# Patient Record
Sex: Female | Born: 1945 | Race: White | Hispanic: No | Smoking: Never smoker
Health system: Southern US, Community
[De-identification: ages and names within clinical notes are randomized; demographics above are authoritative.]

## PROBLEM LIST (undated history)

## (undated) DIAGNOSIS — I639 Cerebral infarction, unspecified: Secondary | ICD-10-CM

## (undated) DIAGNOSIS — G459 Transient cerebral ischemic attack, unspecified: Secondary | ICD-10-CM

## (undated) DIAGNOSIS — D649 Anemia, unspecified: Secondary | ICD-10-CM

## (undated) DIAGNOSIS — R42 Dizziness and giddiness: Secondary | ICD-10-CM

## (undated) DIAGNOSIS — E119 Type 2 diabetes mellitus without complications: Secondary | ICD-10-CM

## (undated) DIAGNOSIS — H269 Unspecified cataract: Secondary | ICD-10-CM

## (undated) DIAGNOSIS — J189 Pneumonia, unspecified organism: Secondary | ICD-10-CM

## (undated) DIAGNOSIS — I10 Essential (primary) hypertension: Secondary | ICD-10-CM

## (undated) HISTORY — PX: LITHOTRIPSY: SUR834

## (undated) HISTORY — PX: CHOLECYSTECTOMY: SHX55

## (undated) HISTORY — PX: BREAST SURGERY: SHX581

## (undated) HISTORY — PX: ABDOMINAL HYSTERECTOMY: SHX81

## (undated) HISTORY — DX: Unspecified cataract: H26.9

---

## 2004-03-21 ENCOUNTER — Inpatient Hospital Stay: Payer: Self-pay | Admitting: Internal Medicine

## 2004-04-16 ENCOUNTER — Ambulatory Visit: Payer: Self-pay | Admitting: Internal Medicine

## 2004-05-16 ENCOUNTER — Ambulatory Visit: Payer: Self-pay | Admitting: Cardiovascular Disease

## 2004-07-09 ENCOUNTER — Emergency Department: Payer: Self-pay | Admitting: Emergency Medicine

## 2004-07-09 ENCOUNTER — Other Ambulatory Visit: Payer: Self-pay

## 2004-07-10 ENCOUNTER — Inpatient Hospital Stay: Payer: Self-pay | Admitting: Cardiovascular Disease

## 2004-07-10 ENCOUNTER — Other Ambulatory Visit: Payer: Self-pay

## 2004-09-07 ENCOUNTER — Inpatient Hospital Stay: Payer: Self-pay | Admitting: Internal Medicine

## 2008-06-30 ENCOUNTER — Emergency Department: Payer: Self-pay | Admitting: Emergency Medicine

## 2010-09-29 ENCOUNTER — Emergency Department: Payer: Self-pay | Admitting: Emergency Medicine

## 2010-10-01 ENCOUNTER — Inpatient Hospital Stay: Payer: Self-pay | Admitting: Surgery

## 2012-07-20 ENCOUNTER — Ambulatory Visit: Payer: Self-pay | Admitting: Internal Medicine

## 2013-05-23 ENCOUNTER — Inpatient Hospital Stay: Payer: Self-pay | Admitting: Specialist

## 2013-05-23 LAB — CBC
HCT: 33.9 % — AB (ref 35.0–47.0)
HGB: 11.3 g/dL — AB (ref 12.0–16.0)
MCH: 30.4 pg (ref 26.0–34.0)
MCHC: 33.3 g/dL (ref 32.0–36.0)
MCV: 91 fL (ref 80–100)
Platelet: 159 10*3/uL (ref 150–440)
RBC: 3.71 10*6/uL — ABNORMAL LOW (ref 3.80–5.20)
RDW: 12.7 % (ref 11.5–14.5)
WBC: 7.8 10*3/uL (ref 3.6–11.0)

## 2013-05-23 LAB — URINALYSIS, COMPLETE
BILIRUBIN, UR: NEGATIVE
Bacteria: NONE SEEN
Blood: NEGATIVE
Glucose,UR: >=500 mg/dL (ref 0–75)
Ketone: NEGATIVE
Leukocyte Esterase: NEGATIVE
NITRITE: NEGATIVE
PH: 7 (ref 4.5–8.0)
Protein: 100
RBC,UR: 1 /HPF (ref 0–5)
SPECIFIC GRAVITY: 1.018 (ref 1.003–1.030)
Squamous Epithelial: NONE SEEN

## 2013-05-23 LAB — COMPREHENSIVE METABOLIC PANEL
ALT: 17 U/L (ref 12–78)
Albumin: 2.6 g/dL — ABNORMAL LOW (ref 3.4–5.0)
Alkaline Phosphatase: 120 U/L — ABNORMAL HIGH
Anion Gap: 8 (ref 7–16)
BUN: 40 mg/dL — AB (ref 7–18)
Bilirubin,Total: 0.3 mg/dL (ref 0.2–1.0)
CALCIUM: 9.1 mg/dL (ref 8.5–10.1)
CREATININE: 2.26 mg/dL — AB (ref 0.60–1.30)
Chloride: 89 mmol/L — ABNORMAL LOW (ref 98–107)
Co2: 25 mmol/L (ref 21–32)
EGFR (Non-African Amer.): 21 — ABNORMAL LOW
GFR CALC AF AMER: 25 — AB
Glucose: 897 mg/dL (ref 65–99)
OSMOLALITY: 300 (ref 275–301)
POTASSIUM: 5 mmol/L (ref 3.5–5.1)
SGOT(AST): 12 U/L — ABNORMAL LOW (ref 15–37)
Sodium: 122 mmol/L — ABNORMAL LOW (ref 136–145)
TOTAL PROTEIN: 7.6 g/dL (ref 6.4–8.2)

## 2013-05-23 LAB — TROPONIN I
Troponin-I: <0.02 ng/mL
Troponin-I: <0.02 ng/mL
Troponin-I: <0.02 ng/mL

## 2013-05-23 LAB — CK TOTAL AND CKMB (NOT AT ARMC)
CK, TOTAL: 72 U/L
CK, Total: 51 U/L
CK, Total: 51 U/L
CK-MB: 1 ng/mL (ref 0.5–3.6)
CK-MB: 1 ng/mL (ref 0.5–3.6)
CK-MB: 1 ng/mL (ref 0.5–3.6)

## 2013-05-23 LAB — HEMOGLOBIN A1C: HEMOGLOBIN A1C: 13.3 % — AB (ref 4.2–6.3)

## 2013-05-24 LAB — LIPID PANEL
Cholesterol: 159 mg/dL (ref 0–200)
HDL Cholesterol: 26 mg/dL — ABNORMAL LOW (ref 40–60)
TRIGLYCERIDES: 501 mg/dL — AB (ref 0–200)

## 2013-05-24 LAB — CBC WITH DIFFERENTIAL/PLATELET
BASOS PCT: 0.8 %
Basophil #: 0.1 10*3/uL (ref 0.0–0.1)
EOS PCT: 4.6 %
Eosinophil #: 0.3 10*3/uL (ref 0.0–0.7)
HCT: 29.8 % — ABNORMAL LOW (ref 35.0–47.0)
HGB: 10.4 g/dL — ABNORMAL LOW (ref 12.0–16.0)
LYMPHS ABS: 2.9 10*3/uL (ref 1.0–3.6)
Lymphocyte %: 38.9 %
MCH: 30.7 pg (ref 26.0–34.0)
MCHC: 35 g/dL (ref 32.0–36.0)
MCV: 88 fL (ref 80–100)
Monocyte #: 0.5 x10 3/mm (ref 0.2–0.9)
Monocyte %: 7.2 %
NEUTROS ABS: 3.6 10*3/uL (ref 1.4–6.5)
NEUTROS PCT: 48.5 %
Platelet: 184 10*3/uL (ref 150–440)
RBC: 3.4 10*6/uL — ABNORMAL LOW (ref 3.80–5.20)
RDW: 13.3 % (ref 11.5–14.5)
WBC: 7.5 10*3/uL (ref 3.6–11.0)

## 2013-05-24 LAB — BASIC METABOLIC PANEL
ANION GAP: 5 — AB (ref 7–16)
BUN: 26 mg/dL — AB (ref 7–18)
CALCIUM: 8.4 mg/dL — AB (ref 8.5–10.1)
Chloride: 106 mmol/L (ref 98–107)
Co2: 26 mmol/L (ref 21–32)
Creatinine: 1.91 mg/dL — ABNORMAL HIGH (ref 0.60–1.30)
EGFR (African American): 30 — ABNORMAL LOW
EGFR (Non-African Amer.): 26 — ABNORMAL LOW
Glucose: 136 mg/dL — ABNORMAL HIGH (ref 65–99)
OSMOLALITY: 281 (ref 275–301)
Potassium: 3.6 mmol/L (ref 3.5–5.1)
Sodium: 137 mmol/L (ref 136–145)

## 2014-02-23 ENCOUNTER — Ambulatory Visit: Payer: Self-pay | Admitting: Internal Medicine

## 2014-02-27 DIAGNOSIS — M7062 Trochanteric bursitis, left hip: Secondary | ICD-10-CM | POA: Diagnosis not present

## 2014-02-27 DIAGNOSIS — M79605 Pain in left leg: Secondary | ICD-10-CM | POA: Diagnosis not present

## 2014-04-06 DIAGNOSIS — M79605 Pain in left leg: Secondary | ICD-10-CM | POA: Diagnosis not present

## 2014-04-06 DIAGNOSIS — E1122 Type 2 diabetes mellitus with diabetic chronic kidney disease: Secondary | ICD-10-CM | POA: Diagnosis not present

## 2014-04-06 DIAGNOSIS — E039 Hypothyroidism, unspecified: Secondary | ICD-10-CM | POA: Diagnosis not present

## 2014-04-06 DIAGNOSIS — E782 Mixed hyperlipidemia: Secondary | ICD-10-CM | POA: Diagnosis not present

## 2014-04-06 DIAGNOSIS — I739 Peripheral vascular disease, unspecified: Secondary | ICD-10-CM | POA: Diagnosis not present

## 2014-04-20 DIAGNOSIS — F331 Major depressive disorder, recurrent, moderate: Secondary | ICD-10-CM | POA: Diagnosis not present

## 2014-04-20 DIAGNOSIS — E782 Mixed hyperlipidemia: Secondary | ICD-10-CM | POA: Diagnosis not present

## 2014-04-20 DIAGNOSIS — M7062 Trochanteric bursitis, left hip: Secondary | ICD-10-CM | POA: Diagnosis not present

## 2014-04-20 DIAGNOSIS — I10 Essential (primary) hypertension: Secondary | ICD-10-CM | POA: Diagnosis not present

## 2014-04-20 DIAGNOSIS — E1122 Type 2 diabetes mellitus with diabetic chronic kidney disease: Secondary | ICD-10-CM | POA: Diagnosis not present

## 2014-06-01 DIAGNOSIS — F331 Major depressive disorder, recurrent, moderate: Secondary | ICD-10-CM | POA: Diagnosis not present

## 2014-06-01 DIAGNOSIS — R609 Edema, unspecified: Secondary | ICD-10-CM | POA: Diagnosis not present

## 2014-06-01 DIAGNOSIS — D631 Anemia in chronic kidney disease: Secondary | ICD-10-CM | POA: Diagnosis not present

## 2014-06-01 DIAGNOSIS — N184 Chronic kidney disease, stage 4 (severe): Secondary | ICD-10-CM | POA: Diagnosis not present

## 2014-06-01 DIAGNOSIS — E1129 Type 2 diabetes mellitus with other diabetic kidney complication: Secondary | ICD-10-CM | POA: Diagnosis not present

## 2014-06-01 DIAGNOSIS — E1122 Type 2 diabetes mellitus with diabetic chronic kidney disease: Secondary | ICD-10-CM | POA: Diagnosis not present

## 2014-06-01 DIAGNOSIS — I739 Peripheral vascular disease, unspecified: Secondary | ICD-10-CM | POA: Diagnosis not present

## 2014-06-01 DIAGNOSIS — R809 Proteinuria, unspecified: Secondary | ICD-10-CM | POA: Diagnosis not present

## 2014-06-01 DIAGNOSIS — I10 Essential (primary) hypertension: Secondary | ICD-10-CM | POA: Diagnosis not present

## 2014-06-17 NOTE — Discharge Summary (Signed)
PATIENT NAME:  Felicia Vincent, Felicia Vincent MR#:  [id number] DATE OF BIRTH:  January 17, 1946  DATE OF ADMISSION:  05/23/2013 DATE OF DISCHARGE:  05/24/2013  For a detailed note, please see the history and physical done on admission by Dr. Waldron Labs.   DIAGNOSES AT DISCHARGE:  As follows: 1.  Altered mental status, likely secondary to metabolic encephalopathy. 2.  Severe hyperglycemia due to uncontrolled diabetes. 3.  Chronic kidney disease stage III. 4.  Hypertension. 5.  Hyperlipidemia. 6.  Depression.  The patient is being discharged on a low-sodium, low-fat, American Diabetic Association Diet.   ACTIVITY: As tolerated.  FOLLOWUP:  Dr. Clayborn Bigness in the next 1 to 2 weeks.  DISCHARGE MEDICATIONS: Victoza 18 mg subcutaneous daily, sotalol 120 mg b.i.d., simvastatin 20 mg at bedtime, Imdur 60 mg b.i.d., Celexa 40 mg daily, Plavix 75 mg daily, amlodipine 10 mg daily and Levemir pen 15 units b.i.d. with meals.   PERTINENT STUDIES DONE DURING THE HOSPITAL COURSE:  Are as follows: CT scan of the head done without contrast showing no evidence of any acute intracranial process, chronic small vessel ischemia. A chest x-ray done on admission showing no acute cardiopulmonary disease. An MRI of the brain done without contrast showing no acute intracranial abnormality with no acute stroke, mass or hemorrhage.   HOSPITAL COURSE: This is a 69 year old female who presented to the hospital with altered mental status, confusion.   PROBLEM: 1.  Altered mental status/confusion. The most likely cause of this was metabolic encephalopathy related to underlying severe hyperglycemia. There was some concern for possible underlying stroke, but it has been ruled out. The patient had a CT head which was negative and also an MRI of the brain which was also essentially negative. The patient was aggressively hydrated with IV fluids, placed on an insulin drip and her sugars were corrected and since her sugars have normalized, her mental  status is now back down to baseline.  2.  Hyponatremia. This is most likely pseudohyponatremia from her severe hyperglycemia. After her sugars are corrected, her sodium has now normalized.  3.  History of previous transient ischemic attacks. The patient was maintained on her Plavix. She will continue that. She did have a workup for a possible stroke, as mentioned, which was essentially negative.  4.  Acute on chronic renal failure. The patient's creatinine remained at baseline. She has a history of underlying diabetes. She would probably benefit from being followed by a nephrologist. I did refer her to [facility] as an outpatient.  5.  Hypertension. The patient remained hemodynamically stable. She will continue Imdur and Norvasc.  6.  Hyperlipidemia. The patient was maintained on her simvastatin. She will resume that. 7.  Depression. The patient was maintained on Celexa. She will also resume that. 8.  Uncontrolled diabetes. This was secondary to noncompliance, as the patient was taking her scheduled medications but was not being compliant with her diet. She was strongly advised to do as this will lead to further complications and she understands that. At this point, she will continue her maintenance medications including Levemir and Victoza and have each hemoglobin A1c checked through the next 3 months. Her A1c at discharge was 13.  The patient is a full code.   DISPOSITION: She is being discharged home.   TIME SPENT: 40 minutes.   ____________________________ Belia Heman. Verdell Carmine, MD vjs:ce D: 05/24/2013 15:18:42 ET T: 05/24/2013 19:51:15 ET JOB#: [id number]  cc: Belia Heman. Verdell Carmine, MD, <Dictator> Lavera Guise, MD Henreitta Leber MD  ELECTRONICALLY SIGNED 05/25/2013 14:52

## 2014-06-17 NOTE — H&P (Signed)
PATIENT NAME:  Felicia Vincent, Felicia Vincent MR#:  [id number] DATE OF BIRTH:  [birth year]  DATE OF ADMISSION:  05/23/2013  PRIMARY CARE PHYSICIAN: Dr. Clayborn Bigness.   CHIEF COMPLAINT: Altered mental status. Hyperglycemia.   HISTORY OF PRESENT ILLNESS: This is a 69 year old female with known history of diabetes, hypertension, history of TIA in the past. The patient presents with altered mental status. The patient lives with her son, the patient had fell on the floor . There was no loss of consciousness. Son helped her to come from the floor,  he did notice some mild right side weakness. When the patient presented to the ED she was found to have hyperglycemia with her blood glucose 897. The patient does not notice to have any focal deficits. The patient was given 2 liters of IV normal saline where her mentation much improved. She remained slightly confused, The patient's CT head did not show any acute findings. The patient's blood work did not show any anion gap. The patient was found to have creatinine of 2.26 which is around her baseline. Family reports they are unclear if the patient has been taking her insulin or not, but reports she has been taking it till yesterday morning dose. Reports usually the patient, she is in charge of her medication as she is usually sharp and alert and she is following up on her on own fingersticks and insulin. The patient was started on insulin drip in the ED, and hospitalist requested to admit the patient for further evaluation.   PAST MEDICAL HISTORY: 1. Hypertension.  2. Diabetes.  3. History of transient ischemic attack in the past.  4. Hyperlipidemia.   PAST SURGICAL HISTORY:  1. Lithotripsy.  3. Hysterectomy. 4. Cholecystectomy.   ALLERGIES: No known drug allergies.   SOCIAL HISTORY: No tobacco. No alcohol. No illicit drug use. The patient lives with the son.  FAMILY HISTORY:  Significant for diabetes and liver cancer.   HOME MEDICATIONS:  1. Imdur 60 mg oral daily.   2. Sotalol 120 mg oral 2 times a day.  3. Citalopram 40 mg oral daily.  4. Levemir twice a day, unclear what the dose is. The patient thinks it is 15 units, but this can not be confirmed for sure as she is confused.  5. Victoza 18 mg subcutaneous daily.  6. Simvastatin 20 mg at bedtime.  7. Plavix 75 mg daily.  8. Norvasc 10 mg daily.   REVIEW OF SYSTEMS:  CONSTITUTIONAL:  The patient denies any fever, any chills, any fatigue . weight gain, weight loss. EYES:  Denies blurry vision, double vision, inflammation, glaucoma.  ENT: Denies tinnitus, ear chest pain, hearing loss or discharge.  RESPIRATORY: Denies cough, wheezing, hemoptysis, dyspnea.  CARDIOVASCULAR: Denies chest pain, edema, palpitation. Reports fall,  GASTROINTESTINAL: Denies nausea, vomiting, diarrhea, abdominal pain, hematemesis, or melena.  GENITOURINARY: Denies dysuria, hematuria, or renal colic.  ENDOCRINE: Denies polyuria, polydipsia, heat or cold intolerance.  HEMATOLOGY: Denies anemia, easy bruising, bleeding diathesis.  INTEGUMENT: Denies acne, rash or skin lesion.  MUSCULOSKELETAL: Denies any swelling, gout, cramps.  NEUROLOGIC: Denies any headache, any vertigo, any tremors, any migraine. The family reports the patient having history of TIA in the past. The patient reports currently, no focal deficits.  PSYCHIATRIC: No history of alcohol abuse, substance abuse, no anxiety. No schizophrenia.   PHYSICAL EXAMINATION: VITAL SIGNS: Temperature 97.5, pulse 75, respiratory rate 22, blood pressure 150/90, saturating 95% on room air. Upon presentation, blood pressure was 216/94.  GENERAL: Obese female who looks  comfortable, in no apparent distress.   HEENT: , normocephalic. Pupils equal, reactive to light. Pink conjunctivae. Anicteric sclerae. Dry oral mucosa.  NECK: Supple. No thyromegaly. No JVD.  CHEST: Good air entry bilaterally. No wheezing, rales, rhonchi.  CARDIOVASCULAR: S1, S2 heard. No rubs, murmurs, gallops,  regular rate and rhythm.  ABDOMEN: Obese, soft, nontender, nondistended. Bowel sounds present.  EXTREMITIES: No edema. No clubbing. No cyanosis. Tibial pulses felt bilaterally.  PSYCHIATRIC: The patient is confused, but awake, communicative, alert to place, name and year.  MUSCULOSKELETAL: No joint effusion or erythema. Has mild bruise on the right lower extremity and left knee most likely from fall, and as well, has bruise on the right cheek area from the fall.  SKIN: Decreased skin turgor, dry.   PERTINENT LABORATORY DATA: Glucose 897. BUN 40, creatinine 2.26, sodium 122, potassium 5, chloride 89, CO2 25, ALT 17, AST 12, alkaline phosphatase 120. White blood cells 7.8, hemoglobin 11.3, hematocrit 33.9, platelets 159, urinalysis negative leukocyte esterase and nitrite, pH 7.27, pCO2 51.   CT head without contrast showing no acute intracranial findings and chronic small vessel ischemia. Chest x-ray showing cardiomegaly with no edema or pneumonia.   ASSESSMENT AND PLAN: 1. Encephalopathy, the patient presents with altered mental status, status post fall, unclear if this is related to syncope or presyncope, it is  most likely related to metabolic encephalopathy from her hyperglycemia. Had much improvement after she received IV fluids, as well, we will check MRI of the brain as the family reports questionable right side weakness when that episode happened. Currently, as the patient has normal physical exam, we will give her one dose of 324 mg of aspirin. As well as the patient's blood pressure was found to be significantly elevated as well upon presentation, so this might be as well related to hypertensive encephalopathy as well.  2. Uncontrolled diabetes mellitus with hyperglycemia. The patient does not appear to be in diabetic ketoacidosis as has normal anion gap. We will start her on insulin drip as sugar is significantly elevated,   . We will start her back on her Levemir and insulin sliding scale, but  we will need to verify dose of Levemir by her PCP first.  3. Hypertension. Continue the patient back on sotalol and Norvasc.  4. History of transient ischemic attack in the past. Continue with Plavix.  5. Chronic kidney disease appears to be at baseline.  6. Deep vein thrombosis prophylaxis. Subcutaneous heparin.  7. CODE STATUS: Family reports the patient is FULL CODE.   TOTAL TIME SPENT ON ADMISSION AND PATIENT CARE: 55 minutes.     ____________________________ Albertine Patricia, MD dse:sg D: 05/23/2013 06:59:59 ET T: 05/23/2013 08:32:35 ET JOB#: [id number]  cc: Albertine Patricia, MD, <Dictator> Quinnlan Abruzzo Graciela Husbands MD ELECTRONICALLY SIGNED 05/28/2013 14:34

## 2014-06-22 ENCOUNTER — Ambulatory Visit
Admit: 2014-06-22 | Disposition: A | Discharge status: Home / Self Care | Payer: Self-pay | Attending: Nephrology | Admitting: Nephrology

## 2014-06-22 DIAGNOSIS — I10 Essential (primary) hypertension: Secondary | ICD-10-CM | POA: Diagnosis not present

## 2014-06-22 DIAGNOSIS — N184 Chronic kidney disease, stage 4 (severe): Secondary | ICD-10-CM | POA: Diagnosis not present

## 2014-06-22 DIAGNOSIS — N189 Chronic kidney disease, unspecified: Secondary | ICD-10-CM | POA: Diagnosis not present

## 2014-06-26 ENCOUNTER — Ambulatory Visit: Payer: Self-pay | Admitting: Hematology and Oncology

## 2014-07-08 ENCOUNTER — Emergency Department
Admission: EM | Admit: 2014-07-08 | Discharge: 2014-07-08 | Disposition: A | Discharge status: Home / Self Care | Payer: Medicare Other | Attending: Emergency Medicine | Admitting: Emergency Medicine

## 2014-07-08 ENCOUNTER — Other Ambulatory Visit: Payer: Self-pay

## 2014-07-08 ENCOUNTER — Encounter: Payer: Self-pay | Admitting: Emergency Medicine

## 2014-07-08 DIAGNOSIS — N179 Acute kidney failure, unspecified: Secondary | ICD-10-CM | POA: Diagnosis not present

## 2014-07-08 DIAGNOSIS — N19 Unspecified kidney failure: Secondary | ICD-10-CM

## 2014-07-08 DIAGNOSIS — I10 Essential (primary) hypertension: Secondary | ICD-10-CM | POA: Diagnosis not present

## 2014-07-08 DIAGNOSIS — E119 Type 2 diabetes mellitus without complications: Secondary | ICD-10-CM | POA: Diagnosis not present

## 2014-07-08 DIAGNOSIS — R51 Headache: Secondary | ICD-10-CM | POA: Diagnosis present

## 2014-07-08 HISTORY — DX: Transient cerebral ischemic attack, unspecified: G45.9

## 2014-07-08 HISTORY — DX: Type 2 diabetes mellitus without complications: E11.9

## 2014-07-08 HISTORY — DX: Essential (primary) hypertension: I10

## 2014-07-08 LAB — CBC
HCT: 30.5 % — ABNORMAL LOW (ref 35.0–47.0)
HEMOGLOBIN: 10 g/dL — AB (ref 12.0–16.0)
MCH: 28.4 pg (ref 26.0–34.0)
MCHC: 32.6 g/dL (ref 32.0–36.0)
MCV: 87 fL (ref 80.0–100.0)
Platelets: 199 10*3/uL (ref 150–440)
RBC: 3.51 MIL/uL — AB (ref 3.80–5.20)
RDW: 13.7 % (ref 11.5–14.5)
WBC: 8.7 10*3/uL (ref 3.6–11.0)

## 2014-07-08 LAB — URINALYSIS COMPLETE WITH MICROSCOPIC (ARMC ONLY)
BACTERIA UA: NONE SEEN
BILIRUBIN URINE: NEGATIVE
Glucose, UA: 150 mg/dL — AB
HGB URINE DIPSTICK: NEGATIVE
Ketones, ur: NEGATIVE mg/dL
Leukocytes, UA: NEGATIVE
Nitrite: NEGATIVE
PH: 5 (ref 5.0–8.0)
SPECIFIC GRAVITY, URINE: 1.011 (ref 1.005–1.030)

## 2014-07-08 LAB — BASIC METABOLIC PANEL
ANION GAP: 10 (ref 5–15)
BUN: 41 mg/dL — ABNORMAL HIGH (ref 6–20)
CHLORIDE: 103 mmol/L (ref 101–111)
CO2: 23 mmol/L (ref 22–32)
CREATININE: 3 mg/dL — AB (ref 0.44–1.00)
Calcium: 9 mg/dL (ref 8.9–10.3)
GFR calc Af Amer: 17 mL/min — ABNORMAL LOW (ref >60)
GFR, EST NON AFRICAN AMERICAN: 15 mL/min — AB (ref >60)
GLUCOSE: 245 mg/dL — AB (ref 65–99)
Potassium: 4.1 mmol/L (ref 3.5–5.1)
SODIUM: 136 mmol/L (ref 135–145)

## 2014-07-08 LAB — TROPONIN I: Troponin I: 0.03 ng/mL (ref <0.031)

## 2014-07-08 MED ORDER — BUTALBITAL-APAP-CAFFEINE 50-325-40 MG PO TABS
ORAL_TABLET | ORAL | Status: AC
  Administered 2014-07-08: 2 via ORAL
  Filled 2014-07-08: qty 2

## 2014-07-08 MED ORDER — BUTALBITAL-APAP-CAFFEINE 50-325-40 MG PO TABS
2.0000 | ORAL_TABLET | Freq: Once | ORAL | Status: AC
  Administered 2014-07-08: 2 via ORAL

## 2014-07-08 NOTE — ED Provider Notes (Signed)
[facility] Emergency Department Provider Note    Time seen: 78  I have reviewed the triage vital signs and the nursing notes.   HISTORY  Chief Complaint Headache and Hypertension    HPI Felicia Vincent is a 69 y.o. female presents ER for headache on and off last week. Patient states also her blood pressures been elevated, today her blood pressure was elevated over 160 systolic. Easily she's not had adequate control blood pressure, is taking blood pressure medication. Pain is dull in her head currently bitemporal as well as frontal. Light makes it worse. Pain is mild to moderate distention.    Past Medical History  Diagnosis Date  . Hypertension   . Diabetes mellitus without complication   . TIA (transient ischemic attack)     There are no active problems to display for this patient.   History reviewed. No pertinent past surgical history.  No current outpatient prescriptions on file.  Allergies Review of patient's allergies indicates no known allergies.  History reviewed. No pertinent family history.  Social History History  Substance Use Topics  . Smoking status: Never Smoker   . Smokeless tobacco: Never Used  . Alcohol Use: No    Review of Systems Constitutional: Negative for fever. Eyes: Negative for visual changes. ENT: Negative for sore throat. Cardiovascular: Negative for chest pain. Respiratory: Negative for shortness of breath. Gastrointestinal: Negative for abdominal pain, vomiting and diarrhea. Genitourinary: Negative for dysuria. Musculoskeletal: Negative for back pain. Skin: Negative for rash. Neurological: Positive for headaches negative for focal weakness or numbness.  10-point ROS otherwise negative.  ____________________________________________   PHYSICAL EXAM:  VITAL SIGNS: ED Triage Vitals  Enc Vitals Group     BP 07/08/14 1337 193/71 mmHg     Pulse Rate 07/08/14 1336 86     Resp --      Temp 07/08/14  1336 98.4 F (36.9 C)     Temp Source 07/08/14 1336 Oral     SpO2 07/08/14 1337 99 %     Weight 07/08/14 1337 189 lb (85.73 kg)     Height 07/08/14 1337 5\' 3"  (1.6 m)     Head Cir --      Peak Flow --      Pain Score 07/08/14 1337 5     Pain Loc --      Pain Edu? --      Excl. in [place]? --     Constitutional: Alert and oriented. Well appearing and in no distress. Eyes: Conjunctivae are normal. PERRL. Normal extraocular movements. ENT   Head: Normocephalic and atraumatic.   Nose: No congestion/rhinnorhea.   Mouth/Throat: Mucous membranes are moist.   Neck: No stridor. Hematological/Lymphatic/Immunilogical: No cervical lymphadenopathy. Cardiovascular: Normal rate, regular rhythm. Normal and symmetric distal pulses are present in all extremities. No murmurs, rubs, or gallops. Respiratory: Normal respiratory effort without tachypnea nor retractions. Breath sounds are clear and equal bilaterally. No wheezes/rales/rhonchi. Gastrointestinal: Soft and nontender. No distention. No abdominal bruits. There is no CVA tenderness. Musculoskeletal: Nontender with normal range of motion in all extremities. No joint effusions.  No lower extremity tenderness nor edema. Neurologic:  Normal speech and language. No gross focal neurologic deficits are appreciated. Speech is normal. No gait instability. Skin:  Skin is warm, dry and intact. No rash noted. Psychiatric: Mood and affect are normal. Speech and behavior are normal. Patient exhibits appropriate insight and judgment.  ____________________________________________    LABS (pertinent positives/negatives)  Labs Reviewed  CBC - Abnormal; Notable  for the following:    RBC 3.51 (*)    Hemoglobin 10.0 (*)    HCT 30.5 (*)    All other components within normal limits  URINALYSIS COMPLETEWITH MICROSCOPIC ([facility])  - Abnormal; Notable for the following:    Color, Urine STRAW (*)    APPearance CLEAR (*)    Glucose, UA 150 (*)    Protein,  ur >500 (*)    Squamous Epithelial / LPF 0-5 (*)    All other components within normal limits  BASIC METABOLIC PANEL  TROPONIN I   Creatinine is over 3.0  ____________________________________________    RADIOLOGY  None  ____________________________________________ EKG: EKG was normal sinus rhythm, rate 84, normal axis normal intervals, nonspecific ST-T wave changes.   ED COURSE  Pertinent labs & imaging results that were available during my care of the patient were reviewed by me and considered in my medical decision making (see chart for details).  Had an extensive discussion with the patient about needing to stay in the hospital for her worsening renal function. Patient does not want to stay states she has appointment on Monday with hematology, nephrology, and her primary care doctor. I advised I could not ensure her safety if she went home, she is adamant that she goes home and just follows up on Monday.  FINAL ASSESSMENT AND PLAN  Renal failure, hypertension, headache  Plan: patient is leaving against my advice, is encouraged to be admitted for further workup concerning her renal failure.    Earleen Newport, MD   Earleen Newport, MD 07/08/14 [phone]

## 2014-07-08 NOTE — Discharge Instructions (Signed)
End-Stage Kidney Disease °The kidneys are two organs that lie on either side of the spine between the middle of the back and the front of the abdomen. The kidneys:  °· Remove wastes and extra water from the blood.   °· Produce important hormones. These help keep bones strong, regulate blood pressure, and help create red blood cells.   °· Balance the fluids and chemicals in the blood and tissues. °End-stage kidney disease occurs when the kidneys are so damaged that they cannot do their job. When the kidneys cannot do their job, life-threatening problems occur. The body cannot stay clean and strong without the help of the kidneys. In end-stage kidney disease, the kidneys cannot get better. You need a new kidney or treatments to do some of the work healthy kidneys do in order to stay alive. °CAUSES  °End-stage kidney disease usually occurs when a long-lasting (chronic) kidney disease gets worse. It may also occur after the kidneys are suddenly damaged (acute kidney injury).  °SYMPTOMS  °· Swelling (edema) of the legs, ankles, or feet.   °· Tiredness (lethargy).   °· Nausea or vomiting.   °· Confusion.   °· Problems with urination, such as:   °¨ Decreased urine production.   °¨ Frequent urination, especially at night.   °¨ Frequent accidents in children who are potty trained.   °· Muscle twitches and cramps.   °· Persistent itchiness.   °· Loss of appetite.   °· Headaches.   °· Abnormally dark or light skin.   °· Numbness in the hands or feet.   °· Easy bruising.   °· Frequent hiccups.   °· Menstruation stops. °DIAGNOSIS  °Your health care provider will measure your blood pressure and take some tests. These may include:  °· Urine tests.   °· Blood tests.   °· Imaging tests, such as:   °¨ An ultrasound exam.   °¨ Computed tomography (CT). °· A kidney biopsy. °TREATMENT  °There are two treatments for end-stage kidney disease:  °· A procedure that removes toxic wastes from the body (dialysis).   °· Receiving a new kidney  (kidney transplant). °Both of these treatments have serious risks and consequences. Your health care provider will help you determine which treatment is best for you based on your health, age, and other factors. In addition to having dialysis or a kidney transplant, you may need to take medicines to control high blood pressure (hypertension) and cholesterol and to decrease phosphorus levels in your blood.  °HOME CARE INSTRUCTIONS °· Follow your prescribed diet.   °· Take medicines only as directed by your health care provider.   °· Do not take any new medicines (prescription, over-the-counter, or nutritional supplements) unless approved by your health care provider. Many medicines can worsen your kidney damage or need to have the dose adjusted.   °· Keep all follow-up visits as directed by your health care provider. °MAKE SURE YOU: °· Understand these instructions. °· Will watch your condition. °· Will get help right away if you are not doing well or get worse. °Document Released: 05/03/2003 Document Revised: 06/27/2013 Document Reviewed: 10/10/2011 °[employer]® Patient Information ©2015 [employer]. This information is not intended to replace advice given to you by your health care provider. Make sure you discuss any questions you have with your health care provider. ° °

## 2014-07-08 NOTE — ED Notes (Signed)
Patient to ED with c/o headache off and on for about a week, most recently has been having higher blood pressures and elevated heart rate off and on at times when she checks it.

## 2014-07-08 NOTE — ED Notes (Signed)
Patient recently treated with Z pak for sore throat and head stopping up, but stopped taking medication because headache got worse and blood pressure started going up.

## 2014-07-10 ENCOUNTER — Inpatient Hospital Stay: Payer: Medicare Other | Attending: Hematology and Oncology | Admitting: Hematology and Oncology

## 2014-07-10 ENCOUNTER — Encounter: Payer: Self-pay | Admitting: Hematology and Oncology

## 2014-07-10 ENCOUNTER — Inpatient Hospital Stay: Payer: Medicare Other

## 2014-07-10 DIAGNOSIS — Z79899 Other long term (current) drug therapy: Secondary | ICD-10-CM | POA: Insufficient documentation

## 2014-07-10 DIAGNOSIS — N189 Chronic kidney disease, unspecified: Secondary | ICD-10-CM | POA: Diagnosis not present

## 2014-07-10 DIAGNOSIS — F331 Major depressive disorder, recurrent, moderate: Secondary | ICD-10-CM | POA: Diagnosis not present

## 2014-07-10 DIAGNOSIS — N184 Chronic kidney disease, stage 4 (severe): Secondary | ICD-10-CM | POA: Diagnosis not present

## 2014-07-10 DIAGNOSIS — D649 Anemia, unspecified: Secondary | ICD-10-CM

## 2014-07-10 DIAGNOSIS — I10 Essential (primary) hypertension: Secondary | ICD-10-CM | POA: Insufficient documentation

## 2014-07-10 DIAGNOSIS — Z9049 Acquired absence of other specified parts of digestive tract: Secondary | ICD-10-CM | POA: Insufficient documentation

## 2014-07-10 DIAGNOSIS — E119 Type 2 diabetes mellitus without complications: Secondary | ICD-10-CM | POA: Diagnosis not present

## 2014-07-10 DIAGNOSIS — R809 Proteinuria, unspecified: Secondary | ICD-10-CM | POA: Diagnosis not present

## 2014-07-10 DIAGNOSIS — Z7982 Long term (current) use of aspirin: Secondary | ICD-10-CM | POA: Insufficient documentation

## 2014-07-10 DIAGNOSIS — R609 Edema, unspecified: Secondary | ICD-10-CM | POA: Diagnosis not present

## 2014-07-10 DIAGNOSIS — Z8673 Personal history of transient ischemic attack (TIA), and cerebral infarction without residual deficits: Secondary | ICD-10-CM

## 2014-07-10 DIAGNOSIS — E1122 Type 2 diabetes mellitus with diabetic chronic kidney disease: Secondary | ICD-10-CM | POA: Diagnosis not present

## 2014-07-10 DIAGNOSIS — Z794 Long term (current) use of insulin: Secondary | ICD-10-CM | POA: Insufficient documentation

## 2014-07-10 DIAGNOSIS — E1129 Type 2 diabetes mellitus with other diabetic kidney complication: Secondary | ICD-10-CM | POA: Diagnosis not present

## 2014-07-10 DIAGNOSIS — N183 Chronic kidney disease, stage 3 (moderate): Secondary | ICD-10-CM | POA: Diagnosis not present

## 2014-07-10 LAB — RETICULOCYTES
RBC.: 3.6 MIL/uL — ABNORMAL LOW (ref 3.80–5.20)
Retic Count, Absolute: 61.2 10*3/uL (ref 19.0–183.0)
Retic Ct Pct: 1.7 % (ref 0.4–3.1)

## 2014-07-10 LAB — VITAMIN B12: Vitamin B-12: 2096 pg/mL — ABNORMAL HIGH (ref 180–914)

## 2014-07-10 LAB — IRON AND TIBC
Iron: 74 ug/dL (ref 28–170)
Saturation Ratios: 18 % (ref 10.4–31.8)
TIBC: 419 ug/dL (ref 250–450)
UIBC: 345 ug/dL

## 2014-07-10 LAB — FERRITIN: Ferritin: 16 ng/mL (ref 11–307)

## 2014-07-10 LAB — FOLATE: Folate: 8.7 ng/mL (ref >5.9)

## 2014-07-11 LAB — KAPPA/LAMBDA LIGHT CHAINS
Kappa free light chain: 80.95 mg/L — ABNORMAL HIGH (ref 3.30–19.40)
Kappa, lambda light chain ratio: 1.25 (ref 0.26–1.65)
Lambda free light chains: 64.86 mg/L — ABNORMAL HIGH (ref 5.71–26.30)

## 2014-07-11 LAB — ANTINUCLEAR ANTIBODIES, IFA: ANA Ab, IFA: NEGATIVE

## 2014-07-11 LAB — PROTEIN ELECTROPHORESIS, SERUM
A/G Ratio: 0.7 (ref 0.7–2.0)
Albumin ELP: 2.7 g/dL — ABNORMAL LOW (ref 3.2–5.6)
Alpha-1-Globulin: 0.3 g/dL (ref 0.1–0.4)
Alpha-2-Globulin: 1.2 g/dL (ref 0.4–1.2)
Beta Globulin: 1.5 g/dL — ABNORMAL HIGH (ref 0.6–1.3)
Gamma Globulin: 0.9 g/dL (ref 0.5–1.6)
Globulin, Total: 3.8 g/dL (ref 2.0–4.5)
Total Protein ELP: 6.5 g/dL (ref 6.0–8.5)

## 2014-07-11 LAB — ERYTHROPOIETIN: Erythropoietin: 15.1 m[IU]/mL (ref 2.6–18.5)

## 2014-07-20 ENCOUNTER — Other Ambulatory Visit: Payer: Self-pay

## 2014-07-20 DIAGNOSIS — I10 Essential (primary) hypertension: Secondary | ICD-10-CM | POA: Diagnosis not present

## 2014-07-20 DIAGNOSIS — I739 Peripheral vascular disease, unspecified: Secondary | ICD-10-CM | POA: Diagnosis not present

## 2014-07-20 DIAGNOSIS — N184 Chronic kidney disease, stage 4 (severe): Secondary | ICD-10-CM | POA: Diagnosis not present

## 2014-07-20 DIAGNOSIS — D631 Anemia in chronic kidney disease: Secondary | ICD-10-CM | POA: Diagnosis not present

## 2014-07-20 DIAGNOSIS — D649 Anemia, unspecified: Secondary | ICD-10-CM

## 2014-07-20 DIAGNOSIS — E1122 Type 2 diabetes mellitus with diabetic chronic kidney disease: Secondary | ICD-10-CM | POA: Diagnosis not present

## 2014-07-21 LAB — UPEP/TP, 24-HR URINE
Albumin, U: 67.6 %
Alpha 1, Urine: 4.4 %
Alpha 2, Urine: 7.1 %
Beta, Urine: 14.1 %
Gamma Globulin, Urine: 6.7 %
Total Protein, Urine-Ur/day: 4696.5 mg/24 hr — ABNORMAL HIGH (ref 30.0–150.0)
Total Protein, Urine: 313.1 mg/dL
Total Volume: 1500

## 2014-07-25 ENCOUNTER — Ambulatory Visit: Payer: Medicare Other | Admitting: Hematology and Oncology

## 2014-08-11 DIAGNOSIS — H40033 Anatomical narrow angle, bilateral: Secondary | ICD-10-CM | POA: Diagnosis not present

## 2014-08-20 ENCOUNTER — Encounter: Payer: Self-pay | Admitting: Hematology and Oncology

## 2014-08-20 NOTE — Progress Notes (Signed)
[facility] day:  07/10/2014  Chief Complaint: Felicia Vincent is a 69 y.o. female with anemia who is referred in consultation by Dr. Clayborn Bigness.  HPI:  The patient has been followed by Dr. Humphrey Rolls for the past 10 years. She states that blood work is drawn regularly. The first time she heard she had a problem with her blood was in February or March 2016. At that time, she felt good. She denies any symptoms.  Regarding her diet, she states that she "eats anything I want". She trys to keep her sugar under control.  She eats steak, chicken or hamburger 1-2 times a week.   She states that she has been on B12 supplementation for the past 2 years.  She started this medication herself as she states that she thought it would help with her energy.  She notes a colonoscopy less than 5 years ago. She is unaware of any abnormalities.  She has never had an EGD or capsule study.  She denies any melena, hematochezia, hematuria or vaginal bleeding.  Labs from 09/01/2013 revealed a hematocrit 33.2, hemoglobin 11, MCV 90, platelets 231,000, white count 5000 with an ANC of 5400. Comprehensive metabolic panel included a BUN of 36 and creatinine 2.35.  Estimated GFR was  20 ml/min.  Calcium was 11.1 (8.7-10.3).    CBC on 04/06/2014 noted a hematocrit of 30.9, hemoglobin 10.3, MCV 86, platelets 244,000 white count 9560 with an ANC of 6400.  Comprehensive metabolic panel included a BUN of 28, creatinine of 2.15, calcium 8.8, protein 6.1, and albumen of 3.5. Thyroid function tests were normal. Random urine revealed 9467.4 ug/ml.  CBC on 07/08/2014 revealed a hematocrit of 30.5, hemoglobin 10, platelets 199,000, and WBC 8700.  Past Medical History  Diagnosis Date  . Hypertension   . Diabetes mellitus without complication   . TIA (transient ischemic attack)     Past Surgical History  Procedure Laterality Date  . Cholecystectomy    . Lithotripsy    . Abdominal hysterectomy       No family history on file.  Social History:  reports that she has never smoked. She has never used smokeless tobacco. She reports that she does not drink alcohol or use illicit drugs.  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  The patient is accompanied by her daughter, Karie Schwalbe, and her friend, Jackelyn Poling.  Allergies: No Known Allergies  Current Medications: Current Outpatient Prescriptions  Medication Sig Dispense Refill  . amLODipine (NORVASC) 5 MG tablet Take 5 mg by mouth at bedtime.    Marland Kitchen aspirin EC 81 MG tablet Take by mouth.    Marland Kitchen atorvastatin (LIPITOR) 10 MG tablet Take 10 mg by mouth at bedtime.    . citalopram (CELEXA) 40 MG tablet Take by mouth.    . clopidogrel (PLAVIX) 75 MG tablet Take 75 mg by mouth daily.    . Cyanocobalamin (VITAMIN B-12 CR) 1000 MCG TBCR Take by mouth.    . donepezil (ARICEPT) 5 MG tablet Take 5 mg by mouth every morning.    . fluticasone (FLONASE) 50 MCG/ACT nasal spray Place 2 sprays into both nostrils daily. 1 to 2 sprays in each nostril daily    . furosemide (LASIX) 20 MG tablet Take 20 mg by mouth daily.    Marland Kitchen glimepiride (AMARYL) 4 MG tablet Take by mouth.    Marland Kitchen glucose blood test strip 1 each by Other route as  needed for other. Use as instructed    . hydrALAZINE (APRESOLINE) 10 MG tablet Take 10 mg by mouth 3 (three) times daily.    . Insulin Detemir (LEVEMIR) 100 UNIT/ML Pen Inject into the skin.    Marland Kitchen isosorbide mononitrate (IMDUR) 60 MG 24 hr tablet Take by mouth.    . Liraglutide 18 MG/3ML SOPN Inject into the skin.    Marland Kitchen losartan (COZAAR) 25 MG tablet Take 25 mg by mouth daily.    . polycarbophil (FIBERCON) 625 MG tablet Take 625 mg by mouth daily.    . sotalol (BETAPACE) 120 MG tablet Take by mouth.     No current facility-administered medications for this visit.    Review of Systems:  GENERAL:  Feels good.  Active.  No fevers, sweats or weight loss. PERFORMANCE STATUS (ECOG):  0 HEENT:  Rare epistaxis.   No visual changes, runny nose, sore throat, mouth sores or tenderness. Lungs: No shortness of breath or cough.  No hemoptysis. Cardiac:  No chest pain, palpitations, orthopnea, or PND. GI:  No nausea, vomiting, diarrhea, constipation, melena or hematochezia. GU:  No urgency, frequency, dysuria, or hematuria. Musculoskeletal:  No back pain.  No joint pain.  No muscle tenderness. Extremities:  No pain or swelling. Skin:  Spots caused by Plavix.  No rashes or skin changes. Neuro:  No headache, numbness or weakness, balance or coordination issues. Endocrine:  Diabetes.  No thyroid issues, hot flashes or night sweats. Psych:  No mood changes, depression or anxiety. Pain:  No focal pain. Review of systems:  All other systems reviewed and found to be negative.  Physical Exam: Blood pressure 151/75, pulse 76, temperature 98.2 F (36.8 C), temperature source Oral, height 5' 3" (1.6 m), weight 193 lb 10.8 oz (87.85 kg). GENERAL:  Well developed, well nourished, sitting comfortably in the exam room in no acute distress. MENTAL STATUS:  Alert and oriented to person, place and time. HEAD:  Curly blonde hair.  Normocephalic, atraumatic, face symmetric, no Cushingoid features. EYES:  Hazel eyes.  Pupils equal round and reactive to light and accomodation.  No conjunctivitis or scleral icterus. ENT:  Oropharynx clear without lesion.  Tongue normal. Mucous membranes moist.  RESPIRATORY:  Clear to auscultation without rales, wheezes or rhonchi. CARDIOVASCULAR:  Regular rate and rhythm without murmur, rub or gallop. ABDOMEN:  Soft, non-tender, with active bowel sounds, and no hepatosplenomegaly.  No masses. SKIN:  Abdominal ecchymosis.  No rashes, ulcers or lesions. EXTREMITIES: No edema, no skin discoloration or tenderness.  No palpable cords. LYMPH NODES: No palpable cervical, supraclavicular, axillary or inguinal adenopathy  NEUROLOGICAL: Unremarkable. PSYCH:  Appropriate.   Appointment on 07/10/2014   Component Date Value Ref Range Status  . Ferritin 07/10/2014 16  11 - 307 ng/mL Final  . Iron 07/10/2014 74  28 - 170 ug/dL Final  . TIBC 07/10/2014 419  250 - 450 ug/dL Final  . Saturation Ratios 07/10/2014 18  10.4 - 31.8 % Final  . UIBC 07/10/2014 345   Final  . Folate 07/10/2014 8.7  >5.9 ng/mL Final  . Retic Ct Pct 07/10/2014 1.7  0.4 - 3.1 % Final  . RBC. 07/10/2014 3.60* 3.80 - 5.20 MIL/uL Final  . Retic Count, Manual 07/10/2014 61.2  19.0 - 183.0 K/uL Final  . Vitamin B-12 07/10/2014 2096* 180 - 914 pg/mL Final   Comment: (NOTE) This assay is not validated for testing neonatal or myeloproliferative syndrome specimens for Vitamin B12 levels. Performed at [facility]   .  Total Protein ELP 07/10/2014 6.5  6.0 - 8.5 g/dL Final  . Albumin ELP 07/10/2014 2.7* 3.2 - 5.6 g/dL Final  . Alpha-1-Globulin 07/10/2014 0.3  0.1 - 0.4 g/dL Final  . Alpha-2-Globulin 07/10/2014 1.2  0.4 - 1.2 g/dL Final  . Beta Globulin 07/10/2014 1.5* 0.6 - 1.3 g/dL Final  . Gamma Globulin 07/10/2014 0.9  0.5 - 1.6 g/dL Final  . M-Spike, % 07/10/2014 Not Observed  Not Observed g/dL Final  . SPE Interp. 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 The bridging is due to increases of IgA and IgG which tend to converge in this region. Beta- gamma bridging has also been observed in some malignancies and inflammatory disease, however, in these cases, there is also elevation of the alpha-1 and alpha-2 globulins. Performed At: [facility] [place], [place] [id number] Lindon Romp MD [id number]   . Comment 07/10/2014 Comment   Final   Comment: (NOTE) Protein electrophoresis scan will follow via computer, mail, or courier delivery.   Marland Kitchen GLOBULIN, TOTAL 07/10/2014 3.8  2.0 - 4.5 g/dL Corrected  . A/G Ratio 07/10/2014 0.7  0.7 - 2.0 Corrected  . Kappa free light chain  07/10/2014 80.95* 3.30 - 19.40 mg/L Final  . Lamda free light chains 07/10/2014 64.86* 5.71 - 26.30 mg/L Final  . Kappa, lamda light chain ratio 07/10/2014 1.25  0.26 - 1.65 Final   Comment: (NOTE) Performed At: [facility] [place], [place] [id number] Lindon Romp MD [id number]   . Erythropoietin 07/10/2014 15.1  2.6 - 18.5 mIU/mL Final   Comment: (NOTE) Performed At: [facility] [place], [place] [id number] Lindon Romp MD [id number]   . ANA Ab, IFA 07/10/2014 Negative   Final   Comment: (NOTE)                                     Negative   <1:80                                     Borderline  1:80                                     Positive   >1:80 Performed At: [facility] [place], [place] [id number] Lindon Romp MD [id number]   Admission on 07/08/2014, Discharged on 07/08/2014  Component Date Value Ref Range Status  . Sodium 07/08/2014 136  135 - 145 mmol/L Final  . Potassium 07/08/2014 4.1  3.5 - 5.1 mmol/L Final  . Chloride 07/08/2014 103  101 - 111 mmol/L Final  . CO2 07/08/2014 23  22 - 32 mmol/L Final  . Glucose, Bld 07/08/2014 245* 65 - 99 mg/dL Final  . BUN 07/08/2014 41* 6 - 20 mg/dL Final  . Creatinine, Ser 07/08/2014 3.00* 0.44 - 1.00 mg/dL Final  . Calcium 07/08/2014 9.0  8.9 - 10.3 mg/dL Final  . GFR calc non Af Amer 07/08/2014 15* >60 mL/min Final  . GFR calc Af Amer 07/08/2014 17* >60 mL/min Final   Comment: (NOTE) The eGFR has been calculated using the CKD EPI equation. This calculation has not  been validated in all clinical situations. eGFR's persistently <60 mL/min signify possible Chronic Kidney Disease.   . Anion gap 07/08/2014 10  5 - 15 Final  . WBC 07/08/2014 8.7  3.6 - 11.0 K/uL Final  . RBC 07/08/2014 3.51* 3.80 - 5.20 MIL/uL Final  . Hemoglobin 07/08/2014 10.0* 12.0 - 16.0 g/dL Final  . HCT 07/08/2014 30.5* 35.0 - 47.0 % Final  . MCV 07/08/2014 87.0   80.0 - 100.0 fL Final  . MCH 07/08/2014 28.4  26.0 - 34.0 pg Final  . MCHC 07/08/2014 32.6  32.0 - 36.0 g/dL Final  . RDW 07/08/2014 13.7  11.5 - 14.5 % Final  . Platelets 07/08/2014 199  150 - 440 K/uL Final  . Troponin I 07/08/2014 0.03  <0.031 ng/mL Final   Comment:        NO INDICATION OF MYOCARDIAL INJURY.   . Color, Urine 07/08/2014 STRAW* YELLOW Final  . APPearance 07/08/2014 CLEAR* CLEAR Final  . Glucose, UA 07/08/2014 150* NEGATIVE mg/dL Final  . Bilirubin Urine 07/08/2014 NEGATIVE  NEGATIVE Final  . Ketones, ur 07/08/2014 NEGATIVE  NEGATIVE mg/dL Final  . Specific Gravity, Urine 07/08/2014 1.011  1.005 - 1.030 Final  . Hgb urine dipstick 07/08/2014 NEGATIVE  NEGATIVE Final  . pH 07/08/2014 5.0  5.0 - 8.0 Final  . Protein, ur 07/08/2014 >500* NEGATIVE mg/dL Final  . Nitrite 07/08/2014 NEGATIVE  NEGATIVE Final  . Leukocytes, UA 07/08/2014 NEGATIVE  NEGATIVE Final  . RBC / HPF 07/08/2014 0-5  0 - 5 RBC/hpf Final  . WBC, UA 07/08/2014 0-5  0 - 5 WBC/hpf Final  . Bacteria, UA 07/08/2014 NONE SEEN  NONE SEEN Final  . Squamous Epithelial / LPF 07/08/2014 0-5* NONE SEEN Final  . Mucous 07/08/2014 PRESENT   Final    Assessment:  Felicia Vincent is a 69 y.o. female with anemia likely secondary to chronic renal insufficiency.  Red cell indices are normal.  She notes a good diet.  She denies any melena or hematochezia.  Colonoscopy less than 5 years ago was normal.  She is on B12.  Symptomatically, she feels well.  Exam is unremarkable.  Plan: 1. Review diagnosis of anemia and differential diagnosis.  Discuss labs from 2015 and 2016.  Discuss etiology of anemia likely secondary to renal insufficiency.  Discuss proteinuria.  Discuss evaluation for a monoclonal gammopathy (blood and urine). 2. Labs today:  CBC with diff, SPEP, kappa and lambda free light chains, ANA with reflex, epo level, vitamin B12, folate, ferritin. 3. Collect 24 hour urine for UPEP and free light chains. 4. RTC in  1-2 weeks for review of labs and discussion regarding direction of therapy.  Lequita Asal, MD

## 2014-08-31 ENCOUNTER — Encounter: Payer: Self-pay | Admitting: Hematology and Oncology

## 2014-08-31 ENCOUNTER — Inpatient Hospital Stay: Payer: Medicare Other | Attending: Hematology and Oncology | Admitting: Hematology and Oncology

## 2014-08-31 VITALS — BP 164/70 | HR 61 | Temp 95.6°F | Ht 60.0 in | Wt 188.5 lb

## 2014-08-31 DIAGNOSIS — D631 Anemia in chronic kidney disease: Secondary | ICD-10-CM | POA: Insufficient documentation

## 2014-08-31 DIAGNOSIS — E119 Type 2 diabetes mellitus without complications: Secondary | ICD-10-CM | POA: Diagnosis not present

## 2014-08-31 DIAGNOSIS — E1122 Type 2 diabetes mellitus with diabetic chronic kidney disease: Secondary | ICD-10-CM | POA: Diagnosis not present

## 2014-08-31 DIAGNOSIS — Z79899 Other long term (current) drug therapy: Secondary | ICD-10-CM

## 2014-08-31 DIAGNOSIS — Z8673 Personal history of transient ischemic attack (TIA), and cerebral infarction without residual deficits: Secondary | ICD-10-CM | POA: Diagnosis not present

## 2014-08-31 DIAGNOSIS — I129 Hypertensive chronic kidney disease with stage 1 through stage 4 chronic kidney disease, or unspecified chronic kidney disease: Secondary | ICD-10-CM | POA: Diagnosis not present

## 2014-08-31 DIAGNOSIS — N189 Chronic kidney disease, unspecified: Secondary | ICD-10-CM

## 2014-08-31 DIAGNOSIS — D649 Anemia, unspecified: Secondary | ICD-10-CM

## 2014-08-31 DIAGNOSIS — H40033 Anatomical narrow angle, bilateral: Secondary | ICD-10-CM | POA: Diagnosis not present

## 2014-08-31 DIAGNOSIS — N184 Chronic kidney disease, stage 4 (severe): Secondary | ICD-10-CM | POA: Diagnosis not present

## 2014-08-31 DIAGNOSIS — I10 Essential (primary) hypertension: Secondary | ICD-10-CM | POA: Diagnosis not present

## 2014-08-31 DIAGNOSIS — Z0001 Encounter for general adult medical examination with abnormal findings: Secondary | ICD-10-CM | POA: Diagnosis not present

## 2014-08-31 DIAGNOSIS — E782 Mixed hyperlipidemia: Secondary | ICD-10-CM | POA: Diagnosis not present

## 2014-08-31 NOTE — Progress Notes (Signed)
Pt here today for follow up regarding anemia; offers no complaints today

## 2014-08-31 NOTE — Progress Notes (Signed)
[facility] day:  08/31/2014  Chief Complaint: Felicia Vincent is a 69 y.o. female with anemia who is seen for review of work-up and discussion regarding direction of therapy.  HPI:  The patient was last seen in the medical oncology clinic on 07/10/2014. At that time she was seen in consultation for anemia.  Anemia was most likely felt secondary to chronic renal insufficiency. Review of the labs had revealed proteinuria.  She underwent a workup.  CBC revealed a hematocrit of 30.5, hemoglobin 10, MCV 87, platelets are 199,000, and white count 8700.  BMP revealed a creatinine of 3.0.  Iron studies included a ferritin of 16, 18% iron saturation and a TIBC of 419. Folate was 8.7.  B12 was 2096.  Reticulocyte count was 1.7%. SPEP revealed no monoclonal protein.  Kappa free light chains were 80.95, lambda free light chains 64.86, with a ratio of 1.25 (normal).  Erythropoietin level was low at 15.1.  ANA was negative.  Symptomatically, she denies any new complaint.  Past Medical History  Diagnosis Date  . Hypertension   . Diabetes mellitus without complication   . TIA (transient ischemic attack)   . Cataract     Past Surgical History  Procedure Laterality Date  . Cholecystectomy    . Lithotripsy    . Abdominal hysterectomy      No family history on file.  Social History:  reports that she has never smoked. She has never used smokeless tobacco. She reports that she does not drink alcohol or use illicit drugs.  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  The patient is accompanied by her son.  Allergies: No Known Allergies  Current Medications: Current Outpatient Prescriptions  Medication Sig Dispense Refill  . amLODipine (NORVASC) 5 MG tablet Take 5 mg by mouth at bedtime.    Marland Kitchen aspirin EC 81 MG tablet Take by mouth.    Marland Kitchen atorvastatin (LIPITOR) 10 MG tablet Take 10 mg by mouth at bedtime.    . citalopram  (CELEXA) 40 MG tablet Take by mouth.    . clopidogrel (PLAVIX) 75 MG tablet Take 75 mg by mouth daily.    . Cyanocobalamin (VITAMIN B-12 CR) 1000 MCG TBCR Take by mouth.    . donepezil (ARICEPT) 5 MG tablet Take 5 mg by mouth every morning.    . furosemide (LASIX) 20 MG tablet Take 20 mg by mouth daily.    Marland Kitchen glimepiride (AMARYL) 4 MG tablet Take by mouth.    Marland Kitchen glucose blood test strip 1 each by Other route as needed for other. Use as instructed    . hydrALAZINE (APRESOLINE) 10 MG tablet Take 10 mg by mouth 3 (three) times daily.    . Insulin Detemir (LEVEMIR) 100 UNIT/ML Pen Inject into the skin.    Marland Kitchen isosorbide mononitrate (IMDUR) 60 MG 24 hr tablet Take by mouth.    . Liraglutide 18 MG/3ML SOPN Inject into the skin.    Marland Kitchen losartan (COZAAR) 25 MG tablet Take 25 mg by mouth daily.    . polycarbophil (FIBERCON) 625 MG tablet Take 625 mg by mouth daily.    . fluticasone (FLONASE) 50 MCG/ACT nasal spray Place 2 sprays into both nostrils daily. 1 to 2 sprays in each nostril daily    . sotalol (BETAPACE) 120 MG tablet Take by mouth.     No current facility-administered medications for this visit.    Review of Systems:  GENERAL:  Feels good.  Active.  No fevers, sweats or weight loss. PERFORMANCE STATUS (ECOG):  0 HEENT:  Rare epistaxis.  No visual changes, runny nose, sore throat, mouth sores or tenderness. Lungs: No shortness of breath or cough.  No hemoptysis. Cardiac:  No chest pain, palpitations, orthopnea, or PND. GI:  No nausea, vomiting, diarrhea, constipation, melena or hematochezia. GU:  No urgency, frequency, dysuria, or hematuria. Musculoskeletal:  No back pain.  No joint pain.  No muscle tenderness. Extremities:  No pain or swelling. Skin:  No rashes or skin changes. Neuro:  No headache, numbness or weakness, balance or coordination issues. Endocrine:  Diabetes.  No thyroid issues, hot flashes or night sweats. Psych:  No mood changes, depression or anxiety. Pain:  No focal  pain. Review of systems:  All other systems reviewed and found to be negative.  Physical Exam: Blood pressure 164/70, pulse 61, temperature 95.6 F (35.3 C), temperature source Tympanic, height 5' (1.524 m), weight 188 lb 7.9 oz (85.5 kg). GENERAL:  Well developed, well nourished, sitting comfortably in the exam room in no acute distress. MENTAL STATUS:  Alert and oriented to person, place and time. HEAD:  Curly blonde hair.  Normocephalic, atraumatic, face symmetric, no Cushingoid features. EYES:  Glasses.  Hazel eyes.  No conjunctivitis or scleral icterus. NEUROLOGICAL: Unremarkable. PSYCH:  Appropriate.   No visits with results within 3 Day(s) from this visit. Latest known visit with results is:  Orders Only on 07/20/2014  Component Date Value Ref Range Status  . Total Protein, Urine 07/19/2014 313.1  Not Estab. mg/dL Final   Comment: (NOTE) Results confirmed on dilution.              **Please note reference interval change**   . Total Protein, Urine-Ur/day 07/19/2014 4696.5* 30.0 - 150.0 mg/24 hr Final  . Albumin, U 07/19/2014 67.6   Final  . Alpha 1, Urine 07/19/2014 4.4   Final  . Alpha 2, Urine 07/19/2014 7.1   Final  . Beta, Urine 07/19/2014 14.1   Final  . Gamma Globulin, Urine 07/19/2014 6.7   Final  . M-spike, % 07/19/2014 Not Observed  Not Observed % Final  . PLEASE NOTE: 07/19/2014 Comment   Final   Comment: (NOTE) Protein electrophoresis scan will follow via computer, mail, or courier delivery. Performed At: [facility] [place], [place] [id number] Lindon Romp MD [id number]   . Total Volume 07/19/2014 1500   Final    Assessment:  Felicia Vincent is a 69 y.o. female with anemia secondary to chronic renal insufficiency.  Red cell indices are normal.  She notes a good diet.  She denies any melena or hematochezia.  Colonoscopy less than 5 years ago was normal.  She is on B12.  Work-up on 07/10/2014 revealed a creatinine of 3.0 (CrCl  15 ml/min). She may have some component of mild iron deficiency (ferritin of 16, 18% iron saturation, TIBC of 419).  Reticulocyte count was low (1.7%).  Normal studies included B12, folate, SPEP, ANA, and free light chains.  Symptomatically, she feels well.  Exam is unremarkable.  Plan: 1. Nurse to contact patient regarding 24 hour urine for UPEP and free light chains. 2. Preauth Procrit. 3. Anticipate instituting Procrit when hemoglobin < 10. 4. RTC  prn.   Lequita Asal, MD  08/31/2014

## 2014-09-14 DIAGNOSIS — E11359 Type 2 diabetes mellitus with proliferative diabetic retinopathy without macular edema: Secondary | ICD-10-CM | POA: Diagnosis not present

## 2014-10-25 DIAGNOSIS — H2513 Age-related nuclear cataract, bilateral: Secondary | ICD-10-CM | POA: Diagnosis not present

## 2014-10-31 ENCOUNTER — Other Ambulatory Visit: Payer: Self-pay

## 2014-10-31 ENCOUNTER — Encounter: Payer: Self-pay | Admitting: *Deleted

## 2014-10-31 ENCOUNTER — Emergency Department: Payer: Medicare Other

## 2014-10-31 DIAGNOSIS — E119 Type 2 diabetes mellitus without complications: Secondary | ICD-10-CM | POA: Diagnosis not present

## 2014-10-31 DIAGNOSIS — E876 Hypokalemia: Secondary | ICD-10-CM | POA: Diagnosis not present

## 2014-10-31 DIAGNOSIS — N185 Chronic kidney disease, stage 5: Secondary | ICD-10-CM | POA: Diagnosis not present

## 2014-10-31 DIAGNOSIS — Z7982 Long term (current) use of aspirin: Secondary | ICD-10-CM | POA: Insufficient documentation

## 2014-10-31 DIAGNOSIS — R0600 Dyspnea, unspecified: Secondary | ICD-10-CM | POA: Diagnosis not present

## 2014-10-31 DIAGNOSIS — E1122 Type 2 diabetes mellitus with diabetic chronic kidney disease: Secondary | ICD-10-CM | POA: Diagnosis not present

## 2014-10-31 DIAGNOSIS — Z7902 Long term (current) use of antithrombotics/antiplatelets: Secondary | ICD-10-CM | POA: Diagnosis not present

## 2014-10-31 DIAGNOSIS — J189 Pneumonia, unspecified organism: Secondary | ICD-10-CM | POA: Insufficient documentation

## 2014-10-31 DIAGNOSIS — E11649 Type 2 diabetes mellitus with hypoglycemia without coma: Secondary | ICD-10-CM | POA: Diagnosis not present

## 2014-10-31 DIAGNOSIS — N170 Acute kidney failure with tubular necrosis: Secondary | ICD-10-CM | POA: Diagnosis not present

## 2014-10-31 DIAGNOSIS — Z79899 Other long term (current) drug therapy: Secondary | ICD-10-CM | POA: Insufficient documentation

## 2014-10-31 DIAGNOSIS — I10 Essential (primary) hypertension: Secondary | ICD-10-CM | POA: Diagnosis not present

## 2014-10-31 DIAGNOSIS — E860 Dehydration: Secondary | ICD-10-CM | POA: Diagnosis not present

## 2014-10-31 DIAGNOSIS — J9601 Acute respiratory failure with hypoxia: Secondary | ICD-10-CM | POA: Diagnosis not present

## 2014-10-31 DIAGNOSIS — R05 Cough: Secondary | ICD-10-CM | POA: Diagnosis not present

## 2014-10-31 DIAGNOSIS — Z8673 Personal history of transient ischemic attack (TIA), and cerebral infarction without residual deficits: Secondary | ICD-10-CM | POA: Diagnosis not present

## 2014-10-31 DIAGNOSIS — I4891 Unspecified atrial fibrillation: Secondary | ICD-10-CM | POA: Diagnosis not present

## 2014-10-31 DIAGNOSIS — E875 Hyperkalemia: Secondary | ICD-10-CM | POA: Diagnosis not present

## 2014-10-31 DIAGNOSIS — E872 Acidosis: Secondary | ICD-10-CM | POA: Diagnosis not present

## 2014-10-31 DIAGNOSIS — D638 Anemia in other chronic diseases classified elsewhere: Secondary | ICD-10-CM | POA: Diagnosis not present

## 2014-10-31 DIAGNOSIS — E871 Hypo-osmolality and hyponatremia: Secondary | ICD-10-CM | POA: Diagnosis not present

## 2014-10-31 DIAGNOSIS — I5033 Acute on chronic diastolic (congestive) heart failure: Secondary | ICD-10-CM | POA: Diagnosis not present

## 2014-10-31 DIAGNOSIS — E1165 Type 2 diabetes mellitus with hyperglycemia: Secondary | ICD-10-CM | POA: Diagnosis not present

## 2014-10-31 DIAGNOSIS — Z7951 Long term (current) use of inhaled steroids: Secondary | ICD-10-CM | POA: Insufficient documentation

## 2014-10-31 DIAGNOSIS — I272 Other secondary pulmonary hypertension: Secondary | ICD-10-CM | POA: Diagnosis not present

## 2014-10-31 DIAGNOSIS — E11319 Type 2 diabetes mellitus with unspecified diabetic retinopathy without macular edema: Secondary | ICD-10-CM | POA: Diagnosis not present

## 2014-10-31 DIAGNOSIS — Z794 Long term (current) use of insulin: Secondary | ICD-10-CM | POA: Insufficient documentation

## 2014-10-31 DIAGNOSIS — R197 Diarrhea, unspecified: Secondary | ICD-10-CM | POA: Diagnosis not present

## 2014-10-31 DIAGNOSIS — I959 Hypotension, unspecified: Secondary | ICD-10-CM | POA: Diagnosis not present

## 2014-10-31 DIAGNOSIS — R0602 Shortness of breath: Secondary | ICD-10-CM | POA: Diagnosis not present

## 2014-10-31 DIAGNOSIS — I120 Hypertensive chronic kidney disease with stage 5 chronic kidney disease or end stage renal disease: Secondary | ICD-10-CM | POA: Diagnosis not present

## 2014-10-31 DIAGNOSIS — Z792 Long term (current) use of antibiotics: Secondary | ICD-10-CM | POA: Diagnosis not present

## 2014-10-31 NOTE — ED Notes (Signed)
Pt reports she has had a bad cough and feels like she cannot catch her breath when she lies flat for about 4 days. Denies pain or swelling

## 2014-11-01 ENCOUNTER — Emergency Department
Admission: EM | Admit: 2014-11-01 | Discharge: 2014-11-01 | Disposition: A | Discharge status: Home / Self Care | Payer: Medicare Other | Attending: Emergency Medicine | Admitting: Emergency Medicine

## 2014-11-01 DIAGNOSIS — J189 Pneumonia, unspecified organism: Secondary | ICD-10-CM

## 2014-11-01 DIAGNOSIS — J181 Lobar pneumonia, unspecified organism: Secondary | ICD-10-CM

## 2014-11-01 LAB — CBC
HCT: 25.1 % — ABNORMAL LOW (ref 35.0–47.0)
HEMOGLOBIN: 8.2 g/dL — AB (ref 12.0–16.0)
MCH: 26.6 pg (ref 26.0–34.0)
MCHC: 32.5 g/dL (ref 32.0–36.0)
MCV: 81.8 fL (ref 80.0–100.0)
PLATELETS: 222 10*3/uL (ref 150–440)
RBC: 3.06 MIL/uL — AB (ref 3.80–5.20)
RDW: 14.3 % (ref 11.5–14.5)
WBC: 9.4 10*3/uL (ref 3.6–11.0)

## 2014-11-01 LAB — BASIC METABOLIC PANEL
ANION GAP: 10 (ref 5–15)
BUN: 40 mg/dL — ABNORMAL HIGH (ref 6–20)
CALCIUM: 8.5 mg/dL — AB (ref 8.9–10.3)
CO2: 26 mmol/L (ref 22–32)
CREATININE: 3.31 mg/dL — AB (ref 0.44–1.00)
Chloride: 99 mmol/L — ABNORMAL LOW (ref 101–111)
GFR, EST AFRICAN AMERICAN: 15 mL/min — AB (ref >60)
GFR, EST NON AFRICAN AMERICAN: 13 mL/min — AB (ref >60)
Glucose, Bld: 328 mg/dL — ABNORMAL HIGH (ref 65–99)
Potassium: 4.7 mmol/L (ref 3.5–5.1)
SODIUM: 135 mmol/L (ref 135–145)

## 2014-11-01 MED ORDER — ALBUTEROL SULFATE (2.5 MG/3ML) 0.083% IN NEBU
INHALATION_SOLUTION | RESPIRATORY_TRACT | Status: AC
  Administered 2014-11-01: 2.5 mg via RESPIRATORY_TRACT
  Filled 2014-11-01: qty 3

## 2014-11-01 MED ORDER — ACETAMINOPHEN-CODEINE #3 300-30 MG PO TABS: 2.0000 | ORAL_TABLET | Freq: Three times a day (TID) | ORAL | Status: DC | PRN

## 2014-11-01 MED ORDER — LEVOFLOXACIN IN D5W 500 MG/100ML IV SOLN: 500.0000 mg | Freq: Once | INTRAVENOUS | Status: DC

## 2014-11-01 MED ORDER — ACETAMINOPHEN-CODEINE #3 300-30 MG PO TABS
2.0000 | ORAL_TABLET | Freq: Four times a day (QID) | ORAL | Status: DC | PRN
  Administered 2014-11-01: 2 via ORAL

## 2014-11-01 MED ORDER — ACETAMINOPHEN-CODEINE #3 300-30 MG PO TABS
ORAL_TABLET | ORAL | Status: AC
  Filled 2014-11-01: qty 2

## 2014-11-01 MED ORDER — LEVOFLOXACIN 500 MG PO TABS
ORAL_TABLET | ORAL | Status: AC
  Administered 2014-11-01: 500 mg
  Filled 2014-11-01: qty 1

## 2014-11-01 MED ORDER — ALBUTEROL SULFATE HFA 108 (90 BASE) MCG/ACT IN AERS: 2.0000 | INHALATION_SPRAY | Freq: Four times a day (QID) | RESPIRATORY_TRACT | Status: AC | PRN

## 2014-11-01 MED ORDER — LEVOFLOXACIN 500 MG PO TABS: 500.0000 mg | ORAL_TABLET | Freq: Once | ORAL | Status: AC

## 2014-11-01 MED ORDER — LEVOFLOXACIN 500 MG PO TABS: 500.0000 mg | ORAL_TABLET | Freq: Every day | ORAL | Status: AC

## 2014-11-01 MED ORDER — ALBUTEROL SULFATE (2.5 MG/3ML) 0.083% IN NEBU
2.5000 mg | INHALATION_SOLUTION | Freq: Once | RESPIRATORY_TRACT | Status: AC
  Administered 2014-11-01: 2.5 mg via RESPIRATORY_TRACT

## 2014-11-01 NOTE — Discharge Instructions (Signed)

## 2014-11-01 NOTE — ED Notes (Signed)
Pt to room 9 via w/c; assisted into stretcher; card monitor placed; pt reports nonprod cough last several days with some SOB and chills; denies pain; resp even/unlab, lungs clear, +PP with -edema noted

## 2014-11-01 NOTE — ED Provider Notes (Signed)
[facility] Emergency Department Provider Note  ____________________________________________  Time seen: 2:00 AM  I have reviewed the triage vital signs and the nursing notes.   HISTORY  Chief Complaint Shortness of Breath     HPI Felicia Vincent is a 69 y.o. female presents with nonproductive cough, dyspnea and fever 4 days. Patient denies any chest discomfort at this time however does admit to headache secondary to coughing repetitively. Patient denies any history of tobacco use     Past Medical History  Diagnosis Date  . Hypertension   . Diabetes mellitus without complication   . TIA (transient ischemic attack)   . Cataract   . Stroke   . Dizziness   . Anemia     Patient Active Problem List   Diagnosis Date Noted  . Anemia 07/10/2014    Past Surgical History  Procedure Laterality Date  . Cholecystectomy    . Lithotripsy    . Abdominal hysterectomy    . Breast surgery      Current Outpatient Rx  Name  Route  Sig  Dispense  Refill  . amLODipine (NORVASC) 5 MG tablet   Oral   Take 5 mg by mouth at bedtime.         Marland Kitchen aspirin EC 81 MG tablet   Oral   Take by mouth.         Marland Kitchen atorvastatin (LIPITOR) 10 MG tablet   Oral   Take 10 mg by mouth at bedtime.         . citalopram (CELEXA) 40 MG tablet   Oral   Take by mouth.         . clopidogrel (PLAVIX) 75 MG tablet   Oral   Take 75 mg by mouth daily.         . Cyanocobalamin (VITAMIN B-12 CR) 1000 MCG TBCR   Oral   Take by mouth.         . donepezil (ARICEPT) 5 MG tablet   Oral   Take 5 mg by mouth every morning.         . fluticasone (FLONASE) 50 MCG/ACT nasal spray   Each Nare   Place 2 sprays into both nostrils daily. 1 to 2 sprays in each nostril daily         . furosemide (LASIX) 20 MG tablet   Oral   Take 20 mg by mouth daily.         Marland Kitchen glimepiride (AMARYL) 4 MG tablet   Oral   Take by mouth.         Marland Kitchen glucose blood test strip   Other  1 each by Other route as needed for other. Use as instructed         . hydrALAZINE (APRESOLINE) 10 MG tablet   Oral   Take 10 mg by mouth 3 (three) times daily.         . Insulin Detemir (LEVEMIR) 100 UNIT/ML Pen   Subcutaneous   Inject into the skin.         Marland Kitchen isosorbide mononitrate (IMDUR) 60 MG 24 hr tablet   Oral   Take by mouth.         . Liraglutide 18 MG/3ML SOPN   Subcutaneous   Inject into the skin.         Marland Kitchen losartan (COZAAR) 25 MG tablet   Oral   Take 25 mg by mouth daily.         . polycarbophil (  FIBERCON) 625 MG tablet   Oral   Take 625 mg by mouth daily.         . sotalol (BETAPACE) 120 MG tablet   Oral   Take by mouth.           Allergies Codeine and Other  No family history on file.  Social History Social History  Substance Use Topics  . Smoking status: Never Smoker   . Smokeless tobacco: Never Used  . Alcohol Use: No    Review of Systems  Constitutional: Negative for fever. Eyes: Negative for visual changes. ENT: Negative for sore throat. Cardiovascular: Negative for chest pain. Respiratory: Positive for cough and shortness of breath Gastrointestinal: Negative for abdominal pain, vomiting and diarrhea. Genitourinary: Negative for dysuria. Musculoskeletal: Negative for back pain. Skin: Negative for rash. Neurological: Negative for headaches, focal weakness or numbness.   10-point ROS otherwise negative.  ____________________________________________   PHYSICAL EXAM:  VITAL SIGNS: ED Triage Vitals  Enc Vitals Group     BP 10/31/14 2317 184/56 mmHg     Pulse Rate 10/31/14 2317 74     Resp 10/31/14 2317 16     Temp 10/31/14 2317 98.5 F (36.9 C)     Temp Source 10/31/14 2317 Oral     SpO2 10/31/14 2317 94 %     Weight 10/31/14 2317 189 lb (85.73 kg)     Height 10/31/14 2317 5\' 1"  (1.549 m)     Head Cir --      Peak Flow --      Pain Score 10/31/14 2317 0     Pain Loc --      Pain Edu? --      Excl. in [place]?  --      Constitutional: Alert and oriented. Well appearing and in no distress. Coughing during examination Eyes: Conjunctivae are normal. PERRL. Normal extraocular movements. ENT   Head: Normocephalic and atraumatic.   Nose: No congestion/rhinnorhea.   Mouth/Throat: Mucous membranes are moist.   Neck: No stridor.  Cardiovascular: Normal rate, regular rhythm. Normal and symmetric distal pulses are present in all extremities. No murmurs, rubs, or gallops. Respiratory: Normal respiratory effort without tachypnea nor retractions. Breath sounds are clear and equal bilaterally. Right middle/lower lobe rhonchi Gastrointestinal: Soft and nontender. No distention. There is no CVA tenderness. Genitourinary: deferred Musculoskeletal: Nontender with normal range of motion in all extremities. No joint effusions.  No lower extremity tenderness nor edema. Neurologic:  Normal speech and language. No gross focal neurologic deficits are appreciated. Speech is normal.  Skin:  Skin is warm, dry and intact. No rash noted. Psychiatric: Mood and affect are normal. Speech and behavior are normal. Patient exhibits appropriate insight and judgment.  ____________________________________________    LABS (pertinent positives/negatives)  Labs Reviewed  BASIC METABOLIC PANEL - Abnormal; Notable for the following:    Chloride 99 (*)    Glucose, Bld 328 (*)    BUN 40 (*)    Creatinine, Ser 3.31 (*)    Calcium 8.5 (*)    GFR calc non Af Amer 13 (*)    GFR calc Af Amer 15 (*)    All other components within normal limits  CBC - Abnormal; Notable for the following:    RBC 3.06 (*)    Hemoglobin 8.2 (*)    HCT 25.1 (*)    All other components within normal limits     ____________________________________________   EKG  ED ECG REPORT I, Aliany Fiorenza, [place], the attending physician, personally  viewed and interpreted this ECG.   Date: 11/01/2014  EKG Time: 11:33 PM  Rate: 72  Rhythm: Normal  sinus rhythm  Axis: None  Intervals: Normal  ST&T Change: None   ____________________________________________    RADIOLOGY DG Chest 2 View (Final result) Result time: 11/01/14 00:09:11   Final result by Rad Results In Interface (11/01/14 00:09:11)   Narrative:   CLINICAL DATA: Cough and shortness of breath for 4 days.  EXAM: CHEST 2 VIEW  COMPARISON: 05/23/2013  FINDINGS: Mild cardiac enlargement. No pulmonary vascular congestion. Mediastinal contours appear intact. Calcification of the aorta. Focal airspace infiltration in the right lower lung likely represents pneumonia. Followup PA and lateral chest X-ray is recommended in 3-4 weeks following trial of antibiotic therapy to ensure resolution and exclude underlying malignancy. Left lung is clear. No blunting of costophrenic angles. No pneumothorax. Degenerative changes in the spine.  IMPRESSION: Airspace infiltration in the right lower lung likely representing pneumonia.   Electronically Signed By: Lucienne Capers M.D. On: 11/01/2014 00:09        INITIAL IMPRESSION / ASSESSMENT AND PLAN / ED COURSE  Pertinent labs & imaging results that were available during my care of the patient were reviewed by me and considered in my medical decision making (see chart for details).  Patient's oxygen saturation 95% on room air. Patient received Levaquin 500 mg total Tylenol with Codeine 2 tablets and one albuterol breathing treatment. Patient states that she is feeling much improved. I advised the patient and her family to return to the emergency department immediately if any difficulty breathing or worsening of any other symptoms.  ____________________________________________   FINAL CLINICAL IMPRESSION(S) / ED DIAGNOSES  Final diagnoses:  Right middle lobe pneumonia      Gregor Hams, MD 11/01/14 [phone]

## 2014-11-02 ENCOUNTER — Ambulatory Visit: Admission: RE | Admit: 2014-11-02 | Payer: Medicare Other | Source: Ambulatory Visit | Admitting: Ophthalmology

## 2014-11-02 HISTORY — DX: Anemia, unspecified: D64.9

## 2014-11-02 HISTORY — DX: Dizziness and giddiness: R42

## 2014-11-02 HISTORY — DX: Cerebral infarction, unspecified: I63.9

## 2014-11-02 SURGERY — PHACOEMULSIFICATION, CATARACT, WITH IOL INSERTION
Anesthesia: Choice | Laterality: Right

## 2014-11-03 DIAGNOSIS — R110 Nausea: Secondary | ICD-10-CM | POA: Diagnosis not present

## 2014-11-03 DIAGNOSIS — R05 Cough: Secondary | ICD-10-CM | POA: Diagnosis not present

## 2014-11-03 DIAGNOSIS — E1122 Type 2 diabetes mellitus with diabetic chronic kidney disease: Secondary | ICD-10-CM | POA: Diagnosis not present

## 2014-11-03 DIAGNOSIS — J189 Pneumonia, unspecified organism: Secondary | ICD-10-CM | POA: Diagnosis not present

## 2014-11-07 ENCOUNTER — Emergency Department: Payer: Medicare Other

## 2014-11-07 ENCOUNTER — Encounter: Payer: Self-pay | Admitting: *Deleted

## 2014-11-07 ENCOUNTER — Inpatient Hospital Stay
Admission: EM | Admit: 2014-11-07 | Discharge: 2014-11-17 | DRG: 208 | Disposition: A | Discharge status: Home Health | Payer: Medicare Other | Attending: Internal Medicine | Admitting: Internal Medicine

## 2014-11-07 ENCOUNTER — Other Ambulatory Visit: Payer: Self-pay

## 2014-11-07 DIAGNOSIS — E1129 Type 2 diabetes mellitus with other diabetic kidney complication: Secondary | ICD-10-CM | POA: Diagnosis not present

## 2014-11-07 DIAGNOSIS — N185 Chronic kidney disease, stage 5: Secondary | ICD-10-CM | POA: Diagnosis present

## 2014-11-07 DIAGNOSIS — I959 Hypotension, unspecified: Secondary | ICD-10-CM | POA: Diagnosis not present

## 2014-11-07 DIAGNOSIS — I5031 Acute diastolic (congestive) heart failure: Secondary | ICD-10-CM

## 2014-11-07 DIAGNOSIS — I5043 Acute on chronic combined systolic (congestive) and diastolic (congestive) heart failure: Secondary | ICD-10-CM | POA: Diagnosis not present

## 2014-11-07 DIAGNOSIS — I120 Hypertensive chronic kidney disease with stage 5 chronic kidney disease or end stage renal disease: Secondary | ICD-10-CM | POA: Diagnosis present

## 2014-11-07 DIAGNOSIS — R059 Cough, unspecified: Secondary | ICD-10-CM

## 2014-11-07 DIAGNOSIS — Z7902 Long term (current) use of antithrombotics/antiplatelets: Secondary | ICD-10-CM | POA: Diagnosis not present

## 2014-11-07 DIAGNOSIS — J9600 Acute respiratory failure, unspecified whether with hypoxia or hypercapnia: Secondary | ICD-10-CM | POA: Diagnosis present

## 2014-11-07 DIAGNOSIS — E860 Dehydration: Secondary | ICD-10-CM | POA: Diagnosis not present

## 2014-11-07 DIAGNOSIS — I071 Rheumatic tricuspid insufficiency: Secondary | ICD-10-CM | POA: Diagnosis present

## 2014-11-07 DIAGNOSIS — D649 Anemia, unspecified: Secondary | ICD-10-CM | POA: Diagnosis not present

## 2014-11-07 DIAGNOSIS — N179 Acute kidney failure, unspecified: Secondary | ICD-10-CM

## 2014-11-07 DIAGNOSIS — J9601 Acute respiratory failure with hypoxia: Secondary | ICD-10-CM | POA: Diagnosis present

## 2014-11-07 DIAGNOSIS — R05 Cough: Secondary | ICD-10-CM | POA: Diagnosis not present

## 2014-11-07 DIAGNOSIS — Z8673 Personal history of transient ischemic attack (TIA), and cerebral infarction without residual deficits: Secondary | ICD-10-CM

## 2014-11-07 DIAGNOSIS — E872 Acidosis: Secondary | ICD-10-CM | POA: Diagnosis not present

## 2014-11-07 DIAGNOSIS — E118 Type 2 diabetes mellitus with unspecified complications: Secondary | ICD-10-CM | POA: Diagnosis not present

## 2014-11-07 DIAGNOSIS — Z4682 Encounter for fitting and adjustment of non-vascular catheter: Secondary | ICD-10-CM | POA: Diagnosis not present

## 2014-11-07 DIAGNOSIS — R0600 Dyspnea, unspecified: Secondary | ICD-10-CM

## 2014-11-07 DIAGNOSIS — J9690 Respiratory failure, unspecified, unspecified whether with hypoxia or hypercapnia: Secondary | ICD-10-CM | POA: Diagnosis not present

## 2014-11-07 DIAGNOSIS — E11649 Type 2 diabetes mellitus with hypoglycemia without coma: Secondary | ICD-10-CM | POA: Diagnosis not present

## 2014-11-07 DIAGNOSIS — E785 Hyperlipidemia, unspecified: Secondary | ICD-10-CM | POA: Diagnosis present

## 2014-11-07 DIAGNOSIS — R197 Diarrhea, unspecified: Secondary | ICD-10-CM | POA: Diagnosis present

## 2014-11-07 DIAGNOSIS — E876 Hypokalemia: Secondary | ICD-10-CM | POA: Diagnosis not present

## 2014-11-07 DIAGNOSIS — Z888 Allergy status to other drugs, medicaments and biological substances status: Secondary | ICD-10-CM

## 2014-11-07 DIAGNOSIS — R001 Bradycardia, unspecified: Secondary | ICD-10-CM | POA: Diagnosis not present

## 2014-11-07 DIAGNOSIS — Z885 Allergy status to narcotic agent status: Secondary | ICD-10-CM

## 2014-11-07 DIAGNOSIS — Z794 Long term (current) use of insulin: Secondary | ICD-10-CM

## 2014-11-07 DIAGNOSIS — E1122 Type 2 diabetes mellitus with diabetic chronic kidney disease: Secondary | ICD-10-CM | POA: Diagnosis not present

## 2014-11-07 DIAGNOSIS — E871 Hypo-osmolality and hyponatremia: Secondary | ICD-10-CM | POA: Diagnosis present

## 2014-11-07 DIAGNOSIS — J810 Acute pulmonary edema: Secondary | ICD-10-CM | POA: Diagnosis not present

## 2014-11-07 DIAGNOSIS — I272 Other secondary pulmonary hypertension: Secondary | ICD-10-CM | POA: Diagnosis not present

## 2014-11-07 DIAGNOSIS — R0602 Shortness of breath: Secondary | ICD-10-CM | POA: Diagnosis not present

## 2014-11-07 DIAGNOSIS — Z4659 Encounter for fitting and adjustment of other gastrointestinal appliance and device: Secondary | ICD-10-CM

## 2014-11-07 DIAGNOSIS — R918 Other nonspecific abnormal finding of lung field: Secondary | ICD-10-CM | POA: Diagnosis not present

## 2014-11-07 DIAGNOSIS — Z7951 Long term (current) use of inhaled steroids: Secondary | ICD-10-CM | POA: Diagnosis not present

## 2014-11-07 DIAGNOSIS — Z01818 Encounter for other preprocedural examination: Secondary | ICD-10-CM

## 2014-11-07 DIAGNOSIS — Z01810 Encounter for preprocedural cardiovascular examination: Secondary | ICD-10-CM | POA: Diagnosis not present

## 2014-11-07 DIAGNOSIS — J189 Pneumonia, unspecified organism: Secondary | ICD-10-CM

## 2014-11-07 DIAGNOSIS — E875 Hyperkalemia: Secondary | ICD-10-CM | POA: Diagnosis present

## 2014-11-07 DIAGNOSIS — Z792 Long term (current) use of antibiotics: Secondary | ICD-10-CM

## 2014-11-07 DIAGNOSIS — I4891 Unspecified atrial fibrillation: Secondary | ICD-10-CM | POA: Diagnosis not present

## 2014-11-07 DIAGNOSIS — H269 Unspecified cataract: Secondary | ICD-10-CM | POA: Diagnosis present

## 2014-11-07 DIAGNOSIS — I5033 Acute on chronic diastolic (congestive) heart failure: Secondary | ICD-10-CM | POA: Diagnosis present

## 2014-11-07 DIAGNOSIS — D638 Anemia in other chronic diseases classified elsewhere: Secondary | ICD-10-CM | POA: Diagnosis present

## 2014-11-07 DIAGNOSIS — E1165 Type 2 diabetes mellitus with hyperglycemia: Secondary | ICD-10-CM | POA: Diagnosis not present

## 2014-11-07 DIAGNOSIS — I10 Essential (primary) hypertension: Secondary | ICD-10-CM | POA: Diagnosis not present

## 2014-11-07 DIAGNOSIS — R531 Weakness: Secondary | ICD-10-CM | POA: Diagnosis not present

## 2014-11-07 DIAGNOSIS — E11319 Type 2 diabetes mellitus with unspecified diabetic retinopathy without macular edema: Secondary | ICD-10-CM | POA: Diagnosis not present

## 2014-11-07 DIAGNOSIS — Z7982 Long term (current) use of aspirin: Secondary | ICD-10-CM | POA: Diagnosis not present

## 2014-11-07 DIAGNOSIS — E119 Type 2 diabetes mellitus without complications: Secondary | ICD-10-CM | POA: Diagnosis not present

## 2014-11-07 DIAGNOSIS — N170 Acute kidney failure with tubular necrosis: Secondary | ICD-10-CM | POA: Diagnosis present

## 2014-11-07 DIAGNOSIS — Z79899 Other long term (current) drug therapy: Secondary | ICD-10-CM

## 2014-11-07 DIAGNOSIS — I5040 Unspecified combined systolic (congestive) and diastolic (congestive) heart failure: Secondary | ICD-10-CM | POA: Diagnosis not present

## 2014-11-07 DIAGNOSIS — R195 Other fecal abnormalities: Secondary | ICD-10-CM | POA: Diagnosis not present

## 2014-11-07 DIAGNOSIS — Z23 Encounter for immunization: Secondary | ICD-10-CM

## 2014-11-07 HISTORY — DX: Pneumonia, unspecified organism: J18.9

## 2014-11-07 LAB — CBC WITH DIFFERENTIAL/PLATELET
BASOS ABS: 0.1 10*3/uL (ref 0–0.1)
Eosinophils Absolute: 0.1 10*3/uL (ref 0–0.7)
Eosinophils Relative: 1 %
HEMATOCRIT: 21.2 % — AB (ref 35.0–47.0)
HEMOGLOBIN: 7.2 g/dL — AB (ref 12.0–16.0)
Lymphocytes Relative: 6 %
Lymphs Abs: 0.7 10*3/uL — ABNORMAL LOW (ref 1.0–3.6)
MCH: 29.9 pg (ref 26.0–34.0)
MCHC: 33.7 g/dL (ref 32.0–36.0)
MCV: 88.8 fL (ref 80.0–100.0)
Monocytes Absolute: 0.8 10*3/uL (ref 0.2–0.9)
Monocytes Relative: 7 %
NEUTROS ABS: 10.2 10*3/uL — AB (ref 1.4–6.5)
Platelets: 242 10*3/uL (ref 150–400)
RBC: 2.39 MIL/uL — ABNORMAL LOW (ref 3.87–5.11)
RDW: 15.3 % — ABNORMAL HIGH (ref 11.5–14.5)
WBC: 11.8 10*3/uL — ABNORMAL HIGH (ref 3.6–11.0)

## 2014-11-07 LAB — COMPREHENSIVE METABOLIC PANEL
ALBUMIN: 2.4 g/dL — AB (ref 3.5–5.0)
ALK PHOS: 78 U/L (ref 38–126)
ALT: 10 U/L — ABNORMAL LOW (ref 14–54)
AST: 12 U/L — AB (ref 15–41)
Anion gap: 10 (ref 5–15)
BILIRUBIN TOTAL: 0.9 mg/dL (ref 0.3–1.2)
BUN: 71 mg/dL — AB (ref 6–20)
CALCIUM: 7.9 mg/dL — AB (ref 8.9–10.3)
CO2: 23 mmol/L (ref 22–32)
Chloride: 93 mmol/L — ABNORMAL LOW (ref 101–111)
Creatinine, Ser: 4.63 mg/dL — ABNORMAL HIGH (ref 0.44–1.00)
GFR calc Af Amer: 10 mL/min — ABNORMAL LOW (ref >60)
GFR calc non Af Amer: 9 mL/min — ABNORMAL LOW (ref >60)
GLUCOSE: 575 mg/dL — AB (ref 65–99)
Potassium: 5.6 mmol/L — ABNORMAL HIGH (ref 3.5–5.1)
Sodium: 126 mmol/L — ABNORMAL LOW (ref 135–145)
TOTAL PROTEIN: 7 g/dL (ref 6.5–8.1)

## 2014-11-07 LAB — BLOOD GAS, ARTERIAL
ACID-BASE DEFICIT: 1.7 mmol/L (ref 0.0–2.0)
Allens test (pass/fail): POSITIVE — AB
BICARBONATE: 23 meq/L (ref 21.0–28.0)
DELIVERY SYSTEMS: POSITIVE
Expiratory PAP: 5
FIO2: 0.45
Inspiratory PAP: 10
O2 SAT: 93.9 %
PATIENT TEMPERATURE: 37
PCO2 ART: 38 mmHg (ref 32.0–48.0)
PH ART: 7.39 (ref 7.350–7.450)
pO2, Arterial: 71 mmHg — ABNORMAL LOW (ref 83.0–108.0)

## 2014-11-07 LAB — CREATININE, SERUM
CREATININE: 4.56 mg/dL — AB (ref 0.44–1.00)
GFR calc Af Amer: 10 mL/min — ABNORMAL LOW (ref >60)
GFR calc non Af Amer: 9 mL/min — ABNORMAL LOW (ref >60)

## 2014-11-07 LAB — PREPARE RBC (CROSSMATCH)

## 2014-11-07 LAB — CBC
HCT: 20.8 % — ABNORMAL LOW (ref 35.0–47.0)
Hemoglobin: 6.8 g/dL — ABNORMAL LOW (ref 12.0–16.0)
MCH: 28.7 pg (ref 26.0–34.0)
MCHC: 32.9 g/dL (ref 32.0–36.0)
MCV: 87.1 fL (ref 80.0–100.0)
PLATELETS: 294 10*3/uL (ref 150–440)
RBC: 2.39 MIL/uL — AB (ref 3.80–5.20)
RDW: 14.9 % — AB (ref 11.5–14.5)
WBC: 11.2 10*3/uL — AB (ref 3.6–11.0)

## 2014-11-07 LAB — LACTIC ACID, PLASMA
Lactic Acid, Venous: 1 mmol/L (ref 0.5–2.0)
Lactic Acid, Venous: 1.5 mmol/L (ref 0.5–2.0)

## 2014-11-07 LAB — GLUCOSE, CAPILLARY
GLUCOSE-CAPILLARY: 290 mg/dL — AB (ref 65–99)
Glucose-Capillary: 478 mg/dL — ABNORMAL HIGH (ref 65–99)

## 2014-11-07 LAB — BRAIN NATRIURETIC PEPTIDE: B NATRIURETIC PEPTIDE 5: 387 pg/mL — AB (ref 0.0–100.0)

## 2014-11-07 LAB — IRON AND TIBC
IRON: 87 ug/dL (ref 28–170)
SATURATION RATIOS: 29 % (ref 10.4–31.8)
TIBC: 296 ug/dL (ref 250–450)
UIBC: 209 ug/dL

## 2014-11-07 LAB — ABO/RH: ABO/RH(D): A POS

## 2014-11-07 LAB — MRSA PCR SCREENING: MRSA by PCR: NEGATIVE

## 2014-11-07 LAB — TROPONIN I

## 2014-11-07 LAB — FERRITIN: FERRITIN: 74 ng/mL (ref 11–307)

## 2014-11-07 MED ORDER — ACETAMINOPHEN 325 MG PO TABS
650.0000 mg | ORAL_TABLET | Freq: Four times a day (QID) | ORAL | Status: DC | PRN
Start: 2014-11-07 — End: 2014-11-17

## 2014-11-07 MED ORDER — INSULIN ASPART 100 UNIT/ML ~~LOC~~ SOLN
0.0000 [IU] | Freq: Three times a day (TID) | SUBCUTANEOUS | Status: DC
  Administered 2014-11-08: 7 [IU] via SUBCUTANEOUS
  Filled 2014-11-07: qty 7
  Filled 2014-11-07: qty 10

## 2014-11-07 MED ORDER — CLOPIDOGREL BISULFATE 75 MG PO TABS: 75.0000 mg | ORAL_TABLET | Freq: Every day | ORAL | Status: DC

## 2014-11-07 MED ORDER — SODIUM CHLORIDE 0.9 % IV BOLUS (SEPSIS)
500.0000 mL | Freq: Once | INTRAVENOUS | Status: AC
  Administered 2014-11-07: 500 mL via INTRAVENOUS

## 2014-11-07 MED ORDER — FUROSEMIDE 10 MG/ML IJ SOLN
40.0000 mg | Freq: Once | INTRAMUSCULAR | Status: AC
Start: 2014-11-07 — End: 2014-11-08
  Administered 2014-11-08: 40 mg via INTRAVENOUS
  Filled 2014-11-07: qty 4

## 2014-11-07 MED ORDER — ASPIRIN EC 81 MG PO TBEC
81.0000 mg | DELAYED_RELEASE_TABLET | Freq: Every day | ORAL | Status: DC
  Administered 2014-11-08 – 2014-11-17 (×9): 81 mg via ORAL
  Filled 2014-11-07 (×9): qty 1

## 2014-11-07 MED ORDER — ATORVASTATIN CALCIUM 10 MG PO TABS
10.0000 mg | ORAL_TABLET | Freq: Every day | ORAL | Status: DC
  Administered 2014-11-07 – 2014-11-16 (×10): 10 mg via ORAL
  Filled 2014-11-07 (×10): qty 1

## 2014-11-07 MED ORDER — PIPERACILLIN-TAZOBACTAM 3.375 G IVPB
3.3750 g | Freq: Once | INTRAVENOUS | Status: AC
  Administered 2014-11-07: 3.375 g via INTRAVENOUS
  Filled 2014-11-07: qty 50

## 2014-11-07 MED ORDER — BENZONATATE 100 MG PO CAPS
200.0000 mg | ORAL_CAPSULE | Freq: Three times a day (TID) | ORAL | Status: DC | PRN
  Administered 2014-11-08 (×2): 200 mg via ORAL
  Filled 2014-11-07 (×2): qty 2

## 2014-11-07 MED ORDER — INSULIN ASPART 100 UNIT/ML ~~LOC~~ SOLN
10.0000 [IU] | Freq: Once | SUBCUTANEOUS | Status: AC
  Administered 2014-11-07: 10 [IU] via SUBCUTANEOUS

## 2014-11-07 MED ORDER — INSULIN DETEMIR 100 UNIT/ML ~~LOC~~ SOLN: 30.0000 [IU] | Freq: Every evening | SUBCUTANEOUS | Status: DC

## 2014-11-07 MED ORDER — ALBUTEROL SULFATE (2.5 MG/3ML) 0.083% IN NEBU
2.5000 mg | INHALATION_SOLUTION | Freq: Four times a day (QID) | RESPIRATORY_TRACT | Status: DC | PRN
  Administered 2014-11-07 – 2014-11-08 (×4): 2.5 mg via RESPIRATORY_TRACT
  Filled 2014-11-07 (×4): qty 3

## 2014-11-07 MED ORDER — HYDRALAZINE HCL 50 MG PO TABS
50.0000 mg | ORAL_TABLET | Freq: Three times a day (TID) | ORAL | Status: DC
  Administered 2014-11-07 – 2014-11-17 (×25): 50 mg via ORAL
  Filled 2014-11-07 (×25): qty 1

## 2014-11-07 MED ORDER — GLIMEPIRIDE 2 MG PO TABS
2.0000 mg | ORAL_TABLET | Freq: Two times a day (BID) | ORAL | Status: DC
  Administered 2014-11-07 – 2014-11-08 (×3): 2 mg via ORAL
  Filled 2014-11-07 (×3): qty 1

## 2014-11-07 MED ORDER — ONDANSETRON HCL 4 MG PO TABS
4.0000 mg | ORAL_TABLET | Freq: Four times a day (QID) | ORAL | Status: DC | PRN
  Administered 2014-11-08: 4 mg via ORAL
  Filled 2014-11-07: qty 1

## 2014-11-07 MED ORDER — ACETAMINOPHEN 650 MG RE SUPP
650.0000 mg | Freq: Four times a day (QID) | RECTAL | Status: DC | PRN
Start: 2014-11-07 — End: 2014-11-17

## 2014-11-07 MED ORDER — PIPERACILLIN-TAZOBACTAM 3.375 G IVPB
3.3750 g | Freq: Three times a day (TID) | INTRAVENOUS | Status: DC
  Administered 2014-11-07: 3.375 g via INTRAVENOUS
  Filled 2014-11-07 (×3): qty 50

## 2014-11-07 MED ORDER — ONDANSETRON HCL 4 MG PO TABS: 4.0000 mg | ORAL_TABLET | Freq: Three times a day (TID) | ORAL | Status: DC | PRN

## 2014-11-07 MED ORDER — DONEPEZIL HCL 5 MG PO TABS
5.0000 mg | ORAL_TABLET | Freq: Every day | ORAL | Status: DC
  Administered 2014-11-08 – 2014-11-17 (×9): 5 mg via ORAL
  Filled 2014-11-07 (×9): qty 1

## 2014-11-07 MED ORDER — SODIUM CHLORIDE 0.9 % IV SOLN
INTRAVENOUS | Status: DC
  Administered 2014-11-08: 02:00:00 via INTRAVENOUS

## 2014-11-07 MED ORDER — ISOSORBIDE MONONITRATE ER 60 MG PO TB24
90.0000 mg | ORAL_TABLET | Freq: Two times a day (BID) | ORAL | Status: DC
  Administered 2014-11-07 – 2014-11-11 (×8): 90 mg via ORAL
  Filled 2014-11-07 (×9): qty 1

## 2014-11-07 MED ORDER — INSULIN ASPART 100 UNIT/ML ~~LOC~~ SOLN
10.0000 [IU] | Freq: Once | SUBCUTANEOUS | Status: DC
Start: 2014-11-07 — End: 2014-11-07
  Filled 2014-11-07: qty 10
  Filled 2014-11-07: qty 0.1

## 2014-11-07 MED ORDER — PIPERACILLIN-TAZOBACTAM 3.375 G IVPB
3.3750 g | Freq: Two times a day (BID) | INTRAVENOUS | Status: DC
  Administered 2014-11-08: 3.375 g via INTRAVENOUS
  Filled 2014-11-07 (×3): qty 50

## 2014-11-07 MED ORDER — SODIUM CHLORIDE 0.9 % IV SOLN: Freq: Once | INTRAVENOUS | Status: DC

## 2014-11-07 MED ORDER — AMLODIPINE BESYLATE 5 MG PO TABS: 5.0000 mg | ORAL_TABLET | Freq: Every day | ORAL | Status: DC

## 2014-11-07 MED ORDER — PNEUMOCOCCAL VAC POLYVALENT 25 MCG/0.5ML IJ INJ
0.5000 mL | INJECTION | INTRAMUSCULAR | Status: DC
  Filled 2014-11-07: qty 0.5

## 2014-11-07 MED ORDER — DEXTROSE 5 % IV SOLN
500.0000 mg | INTRAVENOUS | Status: DC
  Administered 2014-11-08 – 2014-11-13 (×6): 500 mg via INTRAVENOUS
  Filled 2014-11-07 (×8): qty 500

## 2014-11-07 MED ORDER — ONDANSETRON HCL 4 MG/2ML IJ SOLN: 4.0000 mg | Freq: Four times a day (QID) | INTRAMUSCULAR | Status: DC | PRN

## 2014-11-07 MED ORDER — HEPARIN SODIUM (PORCINE) 5000 UNIT/ML IJ SOLN
5000.0000 [IU] | Freq: Three times a day (TID) | INTRAMUSCULAR | Status: DC
  Administered 2014-11-07: 5000 [IU] via SUBCUTANEOUS
  Filled 2014-11-07: qty 1

## 2014-11-07 MED ORDER — GUAIFENESIN ER 600 MG PO TB12
600.0000 mg | ORAL_TABLET | Freq: Two times a day (BID) | ORAL | Status: DC
  Administered 2014-11-07 – 2014-11-11 (×6): 600 mg via ORAL
  Filled 2014-11-07 (×9): qty 1

## 2014-11-07 MED ORDER — INSULIN DETEMIR 100 UNIT/ML ~~LOC~~ SOLN
30.0000 [IU] | Freq: Every evening | SUBCUTANEOUS | Status: DC
  Administered 2014-11-08: 30 [IU] via SUBCUTANEOUS
  Filled 2014-11-07 (×3): qty 0.3

## 2014-11-07 MED ORDER — ALBUTEROL SULFATE HFA 108 (90 BASE) MCG/ACT IN AERS: 2.0000 | INHALATION_SPRAY | Freq: Four times a day (QID) | RESPIRATORY_TRACT | Status: DC | PRN

## 2014-11-07 MED ORDER — VANCOMYCIN HCL IN DEXTROSE 1-5 GM/200ML-% IV SOLN
1000.0000 mg | INTRAVENOUS | Status: DC
  Administered 2014-11-09: 1000 mg via INTRAVENOUS
  Filled 2014-11-07: qty 200

## 2014-11-07 MED ORDER — SOTALOL HCL 120 MG PO TABS
120.0000 mg | ORAL_TABLET | Freq: Two times a day (BID) | ORAL | Status: DC
  Administered 2014-11-07 – 2014-11-15 (×15): 120 mg via ORAL
  Filled 2014-11-07 (×15): qty 1

## 2014-11-07 MED ORDER — VITAMIN B-12 1000 MCG PO TABS
1000.0000 ug | ORAL_TABLET | Freq: Every day | ORAL | Status: DC
  Administered 2014-11-08 – 2014-11-17 (×9): 1000 ug via ORAL
  Filled 2014-11-07 (×9): qty 1

## 2014-11-07 MED ORDER — CITALOPRAM HYDROBROMIDE 20 MG PO TABS: 40.0000 mg | ORAL_TABLET | Freq: Every evening | ORAL | Status: DC

## 2014-11-07 MED ORDER — VANCOMYCIN HCL IN DEXTROSE 1-5 GM/200ML-% IV SOLN
1000.0000 mg | Freq: Once | INTRAVENOUS | Status: AC
  Administered 2014-11-07: 1000 mg via INTRAVENOUS
  Filled 2014-11-07: qty 200

## 2014-11-07 MED ORDER — LIRAGLUTIDE 18 MG/3ML ~~LOC~~ SOPN
1.8000 mg | PEN_INJECTOR | Freq: Every day | SUBCUTANEOUS | Status: DC
Start: 2014-11-07 — End: 2014-11-13
  Administered 2014-11-08 – 2014-11-13 (×6): 1.8 mg via SUBCUTANEOUS
  Filled 2014-11-07: qty 0.3

## 2014-11-07 MED ORDER — PSYLLIUM 95 % PO PACK
1.0000 | PACK | Freq: Every day | ORAL | Status: DC
  Administered 2014-11-08 – 2014-11-14 (×4): 1 via ORAL
  Filled 2014-11-07 (×8): qty 1

## 2014-11-07 NOTE — ED Notes (Signed)
Per EMS report, patient was seeing her MD at [facility] and staff obtained an oxygen sat of 68% RA. Patient was placed on 4L O2 via [place] and pulse ox was 86 %. Upon arrival, patient was placed on 5L O2 via [place] and sats were 90%. Patient was dx with pneumonia over a week ago and has seen her PMD twice since then for continued symptoms.

## 2014-11-07 NOTE — Progress Notes (Signed)
eLink Physician-Brief Progress Note Patient Name: Felicia Vincent DOB: [birth year] MRN: [id number]   Date of Service  11/07/2014  HPI/Events of Note  69 y.o. F adm with PNA, ARF. Pt stable at present on [place] oxygen.  No acute issues  eICU Interventions  Cont to monitor and Rx per primary team      Intervention Category Evaluation Type: New Patient Evaluation  Asencion Noble 11/07/2014, 7:21 PM

## 2014-11-07 NOTE — Progress Notes (Signed)
Erica RT came to unit stated that patients is on [place], oxygenating mid 90's. Called Dr Posey Pronto asked to re-assess patients need for step down status? He will review and make decision on patients status.

## 2014-11-07 NOTE — Progress Notes (Signed)
Report taken. Called Dr Posey Pronto and advised him of hemoglobin. He will review and place orders.

## 2014-11-07 NOTE — H&P (Signed)
[place] [facility] [place] NAME: Felicia Vincent    MR#:  [id number]  DATE OF BIRTH:  12/22/1945  DATE OF ADMISSION:  11/07/2014  PRIMARY CARE PHYSICIAN: Lavera Guise, MD   REQUESTING/REFERRING PHYSICIAN: Nile Riggs MD  CHIEF COMPLAINT:   Chief Complaint  Patient presents with  . Shortness of Breath    HISTORY OF PRESENT ILLNESS: Felicia Vincent  is a 69 y.o. female with a known history of  hypertension, diabetes type 2, chronic kidney disease, history of previous CVA who has not been with feeling well since last week. Patient was actually seen in the emergency room and was started on Levaquin for right-sided pneumonia. Patient was not able to tolerate the Levaquin and developed nausea and sickness. Therefore she was seen by primary care provider on Friday and switched to amoxicillin. Since then she has progressively gotten worst in terms of her breathing. Her shortness of breath has gotten worse. She has had a severe dry cough. And has also intermittent fevers. Patient was brought to the emergency room with the symptoms and her had to be placed on BiPAP.   PAST MEDICAL HISTORY:   Past Medical History  Diagnosis Date  . Hypertension   . Diabetes mellitus without complication   . TIA (transient ischemic attack)   . Cataract   . Stroke   . Dizziness   . Anemia   . Pneumonia     PAST SURGICAL HISTORY:  Past Surgical History  Procedure Laterality Date  . Cholecystectomy    . Lithotripsy    . Abdominal hysterectomy    . Breast surgery      SOCIAL HISTORY:  Social History  Substance Use Topics  . Smoking status: Never Smoker   . Smokeless tobacco: Never Used  . Alcohol Use: No    FAMILY HISTORY: No family history on file.  DRUG ALLERGIES:  Allergies  Allergen Reactions  . Codeine Nausea Only  . Prednisone Other (See Comments)    Pts daughter states that this medication "makes her evil".      REVIEW OF SYSTEMS:    CONSTITUTIONAL: Positive fever, positive fatigue or positive weakness.  EYES: No blurred or double vision.  EARS, NOSE, AND THROAT: No tinnitus or ear pain.  RESPIRATORY: Nonproductive cough, shortness of breath, wheezing or hemoptysis.  CARDIOVASCULAR: No chest pain, positive orthopnea, edema.  GASTROINTESTINAL: No nausea, vomiting, diarrhea or abdominal pain.  GENITOURINARY: No dysuria, hematuria.  ENDOCRINE: No polyuria, nocturia,  HEMATOLOGY: No anemia, easy bruising or bleeding SKIN: No rash or lesion. MUSCULOSKELETAL: No joint pain or arthritis.   NEUROLOGIC: No tingling, numbness, weakness.  PSYCHIATRY: No anxiety or depression.   MEDICATIONS AT HOME:  Prior to Admission medications   Medication Sig Start Date End Date Taking? Authorizing Provider  albuterol (PROVENTIL HFA;VENTOLIN HFA) 108 (90 BASE) MCG/ACT inhaler Inhale 2 puffs into the lungs every 6 (six) hours as needed for wheezing or shortness of breath. 11/01/14  Yes Gregor Hams, MD  amLODipine (NORVASC) 5 MG tablet Take 5 mg by mouth at bedtime.   Yes Historical Provider, MD  amoxicillin-clavulanate (AUGMENTIN) 875-125 MG per tablet Take 1 tablet by mouth 2 (two) times daily. 11/02/14 11/12/14 Yes Historical Provider, MD  aspirin EC 81 MG tablet Take 81 mg by mouth daily.   Yes Historical Provider, MD  atorvastatin (LIPITOR) 10 MG tablet Take 10 mg by mouth at bedtime.   Yes Historical Provider, MD  benzonatate (TESSALON) 200 MG capsule  Take 200 mg by mouth 3 (three) times daily as needed for cough.   Yes Historical Provider, MD  citalopram (CELEXA) 40 MG tablet Take 40 mg by mouth every evening.   Yes Historical Provider, MD  clopidogrel (PLAVIX) 75 MG tablet Take 75 mg by mouth at bedtime.    Yes Historical Provider, MD  donepezil (ARICEPT) 5 MG tablet Take 5 mg by mouth daily.    Yes Historical Provider, MD  furosemide (LASIX) 20 MG tablet Take 20 mg by mouth daily.   Yes Historical Provider, MD  glimepiride (AMARYL)  4 MG tablet Take 2 mg by mouth 2 (two) times daily.   Yes Historical Provider, MD  hydrALAZINE (APRESOLINE) 25 MG tablet Take 50 mg by mouth 3 (three) times daily.   Yes Historical Provider, MD  insulin detemir (LEVEMIR) 100 UNIT/ML injection Inject 30-35 Units into the skin every evening.   Yes Historical Provider, MD  isosorbide mononitrate (IMDUR) 60 MG 24 hr tablet Take 90 mg by mouth 2 (two) times daily.   Yes Historical Provider, MD  Liraglutide (VICTOZA) 18 MG/3ML SOPN Inject 1.8 mg into the skin daily.   Yes Historical Provider, MD  ondansetron (ZOFRAN) 4 MG tablet Take 4 mg by mouth 3 (three) times daily as needed for nausea or vomiting.   Yes Historical Provider, MD  polycarbophil (FIBERCON) 625 MG tablet Take 625 mg by mouth daily.   Yes Historical Provider, MD  sotalol (BETAPACE) 120 MG tablet Take 120 mg by mouth 2 (two) times daily.   Yes Historical Provider, MD  vitamin B-12 (CYANOCOBALAMIN) 1000 MCG tablet Take 1,000 mcg by mouth daily.   Yes Historical Provider, MD  acetaminophen-codeine (TYLENOL #3) 300-30 MG per tablet Take 2 tablets by mouth every 8 (eight) hours as needed for moderate pain. Patient not taking: Reported on 11/07/2014 11/01/14   Gregor Hams, MD  levofloxacin (LEVAQUIN) 500 MG tablet Take 1 tablet (500 mg total) by mouth daily. Patient not taking: Reported on 11/07/2014 11/01/14 11/07/14  Gregor Hams, MD      PHYSICAL EXAMINATION:   VITAL SIGNS: Pulse 60, temperature 98.5 F (36.9 C), temperature source Oral, resp. rate 22, height 5' (1.524 m), weight 86.864 kg (191 lb 8 oz), SpO2 90 %.  GENERAL:  69 y.o.-year-old patient lying in the bed  radically ill on BiPAP EYES: Pupils equal, round, reactive to light and accommodation. No scleral icterus. Extraocular muscles intact.  HEENT: Head atraumatic, normocephalic. Oropharynx and nasopharynx clear.  NECK:  Supple, no jugular venous distention. No thyroid enlargement, no tenderness.  LUNGS: Decreased breath  sounds with associated muscle usage on BiPAP CARDIOVASCULAR: S1, S2 normal. No murmurs, rubs, or gallops.  ABDOMEN: Soft, nontender, nondistended. Bowel sounds present. No organomegaly or mass.  EXTREMITIES: No pedal edema, cyanosis, or clubbing.  NEUROLOGIC: Cranial nerves II through XII are intact. Muscle strength 5/5 in all extremities. Sensation intact. Gait not checked.  PSYCHIATRIC: The patient is alert and oriented x 3.  SKIN: No obvious rash, lesion, or ulcer.   LABORATORY PANEL:   CBC  Recent Labs Lab 10/31/14 2351 11/07/14 1230  WBC 9.4 11.8*  HGB 8.2* 7.2*  HCT 25.1* 21.2*  PLT 222 242  MCV 81.8 88.8  MCH 26.6 29.9  MCHC 32.5 33.7  RDW 14.3 15.3*  LYMPHSABS  --  0.7*  MONOABS  --  0.8  EOSABS  --  0.1  BASOSABS  --  0.1   ------------------------------------------------------------------------------------------------------------------  Chemistries   Recent Labs Lab  10/31/14 2351 11/07/14 1230  NA 135 126*  K 4.7 5.6*  CL 99* 93*  CO2 26 23  GLUCOSE 328* 575*  BUN 40* 71*  CREATININE 3.31* 4.63*  CALCIUM 8.5* 7.9*  AST  --  12*  ALT  --  10*  ALKPHOS  --  78  BILITOT  --  0.9   ------------------------------------------------------------------------------------------------------------------ estimated creatinine clearance is 10.9 mL/min (by C-G formula based on Cr of 4.63). ------------------------------------------------------------------------------------------------------------------ No results for input(s): TSH, T4TOTAL, T3FREE, THYROIDAB in the last 72 hours.  Invalid input(s): FREET3   Coagulation profile No results for input(s): INR, PROTIME in the last 168 hours. ------------------------------------------------------------------------------------------------------------------- No results for input(s): DDIMER in the last 72  hours. -------------------------------------------------------------------------------------------------------------------  Cardiac Enzymes  Recent Labs Lab 11/07/14 1230  TROPONINI <0.03   ------------------------------------------------------------------------------------------------------------------ Invalid input(s): POCBNP  ---------------------------------------------------------------------------------------------------------------  Urinalysis    Component Value Date/Time   COLORURINE STRAW* 07/08/2014 1355   COLORURINE Straw 05/23/2013 0352   APPEARANCEUR CLEAR* 07/08/2014 1355   APPEARANCEUR Clear 05/23/2013 0352   LABSPEC 1.011 07/08/2014 1355   LABSPEC 1.018 05/23/2013 0352   PHURINE 5.0 07/08/2014 1355   PHURINE 7.0 05/23/2013 0352   GLUCOSEU 150* 07/08/2014 1355   GLUCOSEU >=500 05/23/2013 0352   HGBUR NEGATIVE 07/08/2014 1355   HGBUR Negative 05/23/2013 0352   BILIRUBINUR NEGATIVE 07/08/2014 1355   BILIRUBINUR Negative 05/23/2013 0352   KETONESUR NEGATIVE 07/08/2014 1355   KETONESUR Negative 05/23/2013 0352   PROTEINUR >500* 07/08/2014 1355   PROTEINUR 100 mg/dL 05/23/2013 0352   NITRITE NEGATIVE 07/08/2014 1355   NITRITE Negative 05/23/2013 0352   LEUKOCYTESUR NEGATIVE 07/08/2014 1355   LEUKOCYTESUR Negative 05/23/2013 0352     RADIOLOGY: Dg Chest Port 1 View  11/07/2014   CLINICAL DATA:  Dyspnea.  EXAM: PORTABLE CHEST - 1 VIEW  COMPARISON:  October 31, 2014  FINDINGS: The heart size and mediastinal contours are stable. The heart size is enlarged. There is diffuse consolidation throughout the right lung. Patchy consolidation of the left mid and lung base are identified. The visualized skeletal structures are unremarkable.  IMPRESSION: Pneumonias in bilateral lungs.   Electronically Signed   By: Abelardo Diesel M.D.   On: 11/07/2014 13:29    EKG: Orders placed or performed in visit on 11/07/14  . EKG 12-Lead    IMPRESSION AND PLAN: Patient is a  69 year old with bilateral pneumonia  1, bilateral pneumonia: Severe in nature, treat with broad-spectrum IV anabiotic's with bank Zosyn and azithromycin due to severity of her pneumonia. Continue BiPAP monitor her in the stepdown unit. Pulmonary consult  2. Acute renal failure on chronic renal failure due to dehydration: IV fluids if does not improve nephrology consult  3. Hypertension continue amlodipine and hydralazine [place]  4. Diabetes type 2 with hyperglycemia Place patient on insulin continue her Levemir if sugars continue stay elevated may need the insulin drip since she will be in the stepdown unit   5. History of CVA continue aspirin  6. Miscellaneous heparin for DVT prophylaxis   All the records are reviewed and case discussed with ED provider. Management plans discussed with the patient, family and they are in agreement.  CODE STATUS:    Code Status Orders        Start     Ordered   11/07/14 1426  Full code   Continuous     11/07/14 1426       TOTAL TIME TAKING CARE OF THIS PATIENT: 55 minutes. Critical care time   Sherrick Araki, [facility]  M.D on 11/07/2014 at 2:35 PM  Between 7am to 6pm - Pager - [phone]  After 6pm go to [email or web address] - password [email or web address]  Office  [phone]  CC: Primary care physician; Lavera Guise, MD

## 2014-11-07 NOTE — Progress Notes (Signed)
ANTIBIOTIC CONSULT NOTE - INITIAL  Pharmacy Consult for vancomycin dosing/monitoring Indication: pneumonia  Allergies  Allergen Reactions  . Codeine Nausea Only  . Prednisone Other (See Comments)    Pts daughter states that this medication "makes her evil".      Patient Measurements: Height: 5' (152.4 cm) Weight: 191 lb 8 oz (86.864 kg) IBW/kg (Calculated) : 45.5 Adjusted Body Weight: 62.1 kg  Vital Signs: Temp: 98.5 F (36.9 C) (09/13 1207) Temp Source: Oral (09/13 1207) BP: 159/55 mmHg (09/13 1500) Pulse Rate: 63 (09/13 1500) Intake/Output from previous day:   Intake/Output from this shift:    Labs:  Recent Labs  11/07/14 1230 11/07/14 1410  WBC 11.8* 11.2*  HGB 7.2* 6.8*  PLT 242 294  CREATININE 4.63* 4.56*   Estimated Creatinine Clearance: 11.1 mL/min (by C-G formula based on Cr of 4.56). No results for input(s): VANCOTROUGH, VANCOPEAK, VANCORANDOM, GENTTROUGH, GENTPEAK, GENTRANDOM, TOBRATROUGH, TOBRAPEAK, TOBRARND, AMIKACINPEAK, AMIKACINTROU, AMIKACIN in the last 72 hours.   Microbiology: No results found for this or any previous visit (from the past 720 hour(s)).  Medical History: Past Medical History  Diagnosis Date  . Hypertension   . Diabetes mellitus without complication   . TIA (transient ischemic attack)   . Cataract   . Stroke   . Dizziness   . Anemia   . Pneumonia     Medications:  Anti-infectives    Start     Dose/Rate Route Frequency Ordered Stop   11/09/14 1500  vancomycin (VANCOCIN) IVPB 1000 mg/200 mL premix     1,000 mg 200 mL/hr over 60 Minutes Intravenous Every 48 hours 11/07/14 1615     11/07/14 1430  piperacillin-tazobactam (ZOSYN) IVPB 3.375 g     3.375 g 12.5 mL/hr over 240 Minutes Intravenous 3 times per day 11/07/14 1428     11/07/14 1430  azithromycin (ZITHROMAX) 500 mg in dextrose 5 % 250 mL IVPB     500 mg 250 mL/hr over 60 Minutes Intravenous Every 24 hours 11/07/14 1428     11/07/14 1415  piperacillin-tazobactam  (ZOSYN) IVPB 3.375 g     3.375 g 12.5 mL/hr over 240 Minutes Intravenous  Once 11/07/14 1405 11/07/14 1537   11/07/14 1415  vancomycin (VANCOCIN) IVPB 1000 mg/200 mL premix     1,000 mg 200 mL/hr over 60 Minutes Intravenous  Once 11/07/14 1405 11/07/14 1607     Assessment: Pharmacy consulted to dose vancomycin for pneumonia.  Goal of Therapy:  Vancomycin trough level 15-20 mcg/ml  Plan:  Pharmacy consulted to dose vancomycin for pneumonia. Patient has chronic kidney disease with an estimated creatinine clearance of ~11 mL/min. Patient received 1g of vancomycin in ED on 9/13. Will dose at 1 g vancomycin IV q48h due to renal function and will obtain trough at steady state.   Thank you for the consult.  Darylene Price Mechele Kittleson 11/07/2014,4:18 PM

## 2014-11-07 NOTE — ED Provider Notes (Signed)
Time Seen: Approximately 12:15  I have reviewed the triage notes  Chief Complaint: Shortness of Breath   History of Present Illness: Felicia Vincent is a 69 y.o. female who was recently diagnosed with pneumonia. Patient was recently diagnosed with pneumonia and was on a brief course of Levaquin. She apparently didn't tolerate the antibiotic was switched by her primary physician to amoxicillin. She presented to her primary physician's office hypoxic today with 68% on room air. She arrives in respiratory distress and was placed on a nonrebreather here in emergency department after she was on 5 L nasal cannula and her sats were still at 90%. Patient denies any chest pain and a lot of her history and review of systems is taken through her daughter. She apparently lives at home with her son independently. She denies any chest pain, leg pain or swelling, high fever at home, or any other concerns. There's been no history of nausea, vomiting, diaphoresis, productive cough recently. Past Medical History  Diagnosis Date  . Hypertension   . Diabetes mellitus without complication   . TIA (transient ischemic attack)   . Cataract   . Stroke   . Dizziness   . Anemia   . Pneumonia     Patient Active Problem List   Diagnosis Date Noted  . Anemia 07/10/2014    Past Surgical History  Procedure Laterality Date  . Cholecystectomy    . Lithotripsy    . Abdominal hysterectomy    . Breast surgery      Past Surgical History  Procedure Laterality Date  . Cholecystectomy    . Lithotripsy    . Abdominal hysterectomy    . Breast surgery      Current Outpatient Rx  Name  Route  Sig  Dispense  Refill  . acetaminophen-codeine (TYLENOL #3) 300-30 MG per tablet   Oral   Take 2 tablets by mouth every 8 (eight) hours as needed for moderate pain.   30 tablet   0   . albuterol (PROVENTIL HFA;VENTOLIN HFA) 108 (90 BASE) MCG/ACT inhaler   Inhalation   Inhale 2 puffs into the lungs every 6 (six) hours  as needed for wheezing or shortness of breath.   1 Inhaler   2   . amLODipine (NORVASC) 5 MG tablet   Oral   Take 5 mg by mouth at bedtime.         Marland Kitchen aspirin EC 81 MG tablet   Oral   Take by mouth.         Marland Kitchen atorvastatin (LIPITOR) 10 MG tablet   Oral   Take 10 mg by mouth at bedtime.         . citalopram (CELEXA) 40 MG tablet   Oral   Take by mouth.         . clopidogrel (PLAVIX) 75 MG tablet   Oral   Take 75 mg by mouth daily.         . Cyanocobalamin (VITAMIN B-12 CR) 1000 MCG TBCR   Oral   Take by mouth.         . donepezil (ARICEPT) 5 MG tablet   Oral   Take 5 mg by mouth every morning.         . fluticasone (FLONASE) 50 MCG/ACT nasal spray   Each Nare   Place 2 sprays into both nostrils daily. 1 to 2 sprays in each nostril daily         . furosemide (LASIX) 20 MG tablet  Oral   Take 20 mg by mouth daily.         Marland Kitchen glimepiride (AMARYL) 4 MG tablet   Oral   Take by mouth.         Marland Kitchen glucose blood test strip   Other   1 each by Other route as needed for other. Use as instructed         . hydrALAZINE (APRESOLINE) 10 MG tablet   Oral   Take 10 mg by mouth 3 (three) times daily.         . Insulin Detemir (LEVEMIR) 100 UNIT/ML Pen   Subcutaneous   Inject into the skin.         Marland Kitchen isosorbide mononitrate (IMDUR) 60 MG 24 hr tablet   Oral   Take by mouth.         . levofloxacin (LEVAQUIN) 500 MG tablet   Oral   Take 1 tablet (500 mg total) by mouth daily.   7 tablet   0   . Liraglutide 18 MG/3ML SOPN   Subcutaneous   Inject into the skin.         Marland Kitchen losartan (COZAAR) 25 MG tablet   Oral   Take 25 mg by mouth daily.         . polycarbophil (FIBERCON) 625 MG tablet   Oral   Take 625 mg by mouth daily.         . sotalol (BETAPACE) 120 MG tablet   Oral   Take by mouth.           Allergies:  Codeine and Other  Family History: No family history on file.  Social History: Social History  Substance Use  Topics  . Smoking status: Never Smoker   . Smokeless tobacco: Never Used  . Alcohol Use: No     Review of Systems:   10 point review of systems was performed and was otherwise negative:  Constitutional: No fever Eyes: No visual disturbances ENT: No sore throat, ear pain Cardiac: No chest pain Respiratory: Increasing shortness of breath Abdomen: No abdominal pain, no vomiting, No diarrhea Endocrine: No weight loss, No night sweats Extremities: No peripheral edema, cyanosis Skin: No rashes, easy bruising Neurologic: Patient's had some confusion, No focal weakness, trouble with speech or swollowing Urologic: No dysuria, Hematuria, or urinary frequency  Physical Exam:  ED Triage Vitals  Enc Vitals Group     BP --      Pulse Rate 11/07/14 1207 60     Resp 11/07/14 1207 22     Temp 11/07/14 1207 98.5 F (36.9 C)     Temp Source 11/07/14 1207 Oral     SpO2 11/07/14 1207 86 %     Weight 11/07/14 1207 191 lb 8 oz (86.864 kg)     Height 11/07/14 1207 5' (1.524 m)     Head Cir --      Peak Flow --      Pain Score --      Pain Loc --      Pain Edu? --      Excl. in [place]? --     General: Awake , Alert , and Oriented times 3; GCS 15. Speaks in brief sentences with respiratory distress. Some mild upper respiratory retractions. Head: Normal cephalic , atraumatic Eyes: Pupils equal , round, reactive to light Nose/Throat: No nasal drainage, patent upper airway without erythema or exudate.  Neck: Supple, Full range of motion, No anterior adenopathy or palpable thyroid masses  Lungs: Coarse breath sounds auscultated over the right base extending up into the right upper lobes posteriorly. No wheezing or rhonchi are noted. Heart: Regular rate, regular rhythm without murmurs , gallops , or rubs Abdomen: Soft, non tender without rebound, guarding , or rigidity; bowel sounds positive and symmetric in all 4 quadrants. No organomegaly .        Extremities: 2 plus symmetric pulses. No edema,  clubbing or cyanosis Neurologic: normal ambulation, Motor symmetric without deficits, sensory intact Skin: warm, dry, no rashes   Labs:   All laboratory work was reviewed including any pertinent negatives or positives listed below:  Labs Reviewed  CULTURE, BLOOD (ROUTINE X 2)  CULTURE, BLOOD (ROUTINE X 2)  CBC WITH DIFFERENTIAL/PLATELET  COMPREHENSIVE METABOLIC PANEL  LACTIC ACID, PLASMA  LACTIC ACID, PLASMA  BRAIN NATRIURETIC PEPTIDE  TROPONIN I  BLOOD GAS, ARTERIAL    EKG:  ED ECG REPORT I, Daymon Larsen, the attending physician, personally viewed and interpreted this ECG.  Date: 11/07/2014 EKG Time: 1217 Rate: 59 Rhythm: normal sinus rhythm QRS Axis: normal Intervals: normal ST/T Wave abnormalities: Nonspecific ST and T wave abnormality Conduction Disutrbances: none Narrative Interpretation: No acute ischemic changes are noted    Radiology:   PORTABLE CHEST - 1 VIEW  COMPARISON: October 31, 2014  FINDINGS: The heart size and mediastinal contours are stable. The heart size is enlarged. There is diffuse consolidation throughout the right lung. Patchy consolidation of the left mid and lung base are identified. The visualized skeletal structures are unremarkable.  IMPRESSION: Pneumonias in bilateral lungs.   I personally reviewed the radiologic studies   Procedures: * Patient had IV established and blood cultures 2 were obtained. Presumptive diagnosis given her history of pneumonia. Patient also has significant hyperglycemia with reflective hyponatremia. Close observation of her creatinine shows worsening of her renal function. Patient was started on IV Zosyn and vancomycin. She was placed on a CPAP mask which increased her saturations up to 99%. She appears to be able to tolerate the CPAP well.    Critical Care:  CRITICAL CARE Performed by: Daymon Larsen   Total critical care time: 47 minutes  Critical care time was exclusive of separately  billable procedures and treating other patients.  Critical care was necessary to treat or prevent imminent or life-threatening deterioration.  Critical care was time spent personally by me on the following activities: development of treatment plan with patient and/or surrogate as well as nursing, discussions with consultants, evaluation of patient's response to treatment, examination of patient, obtaining history from patient or surrogate, ordering and performing treatments and interventions, ordering and review of laboratory studies, ordering and review of radiographic studies, pulse oximetry and re-evaluation of patient's condition. Complicating community pneumonia with hypoxia, renal insufficiency, hyperglycemia    ED Course:  Patient again had some improvement of her hypoxemia and was initiated again on IV antibiotic therapy though prognostically her recovery may be slow based on her associated medical problems.    Assessment:  Community-acquired pneumonia Hypoxia Hyperglycemia Renal failure   Final Clinical Impression: Community-acquired pneumonia with complication Final diagnoses:  None     Plan: Patient's case was reviewed with the hospitalist team, further disposition and management depend upon their evaluation with admission.            Daymon Larsen, MD 11/07/14 [phone]

## 2014-11-07 NOTE — Progress Notes (Signed)
[facility] Progress Note Patient Name: Felicia Vincent DOB: [birth year] MRN: [id number]   Date of Service  11/07/2014  HPI/Events of Note  Patient with hypoxia, sats running in the mid to high 80s.  Goal per primary care attending is 92% or higher.  Camera check shows patient to be on [place] in NAD with RR of 27.  Hypertensive with BP of 158/61 (89) and HR of 69.  Currently receiving blood transfusion.  Patient refusing to wear BiPAP  eICU Interventions  Plan: RT to place on high flow  per nurse.  If this does not work will need to move to FM O2.  Continue to monitor via [facility]     Intervention Category Intermediate Interventions: Respiratory distress - evaluation and management  Felicia Vincent 11/07/2014, 11:59 PM

## 2014-11-07 NOTE — ED Notes (Signed)
Attempted to call report to floor. Step-down RN had concerns about suitability of patient for the unit. MD is aware.

## 2014-11-07 NOTE — Progress Notes (Signed)
Requested by RN to assist with getting bed placement for patient. On arrival, patient sleeping. Color pale, when awake oxygen sats decreased to 90-93.Contacted Dr. Serita Grit about concerns from patient's nurse and patient's decreasing oxygen saturation. He stated he would change her status to stepdown from telemetry.

## 2014-11-07 NOTE — ED Notes (Signed)
Call was made to nursing supervisor and to Dr. Posey Pronto. Bed assignment switched to step-down. Family aware.

## 2014-11-07 NOTE — ED Notes (Signed)
Respiratory therapist removed Bipap and placed patient on 4L O2 [place]

## 2014-11-08 DIAGNOSIS — N179 Acute kidney failure, unspecified: Secondary | ICD-10-CM

## 2014-11-08 DIAGNOSIS — E118 Type 2 diabetes mellitus with unspecified complications: Secondary | ICD-10-CM

## 2014-11-08 LAB — TYPE AND SCREEN
ABO/RH(D): A POS
Antibody Screen: NEGATIVE
Unit division: 0

## 2014-11-08 LAB — GLUCOSE, CAPILLARY
Glucose-Capillary: 321 mg/dL — ABNORMAL HIGH (ref 65–99)
Glucose-Capillary: 294 mg/dL — ABNORMAL HIGH (ref 65–99)
Glucose-Capillary: 367 mg/dL — ABNORMAL HIGH (ref 65–99)
Glucose-Capillary: 149 mg/dL — ABNORMAL HIGH (ref 65–99)

## 2014-11-08 LAB — C DIFFICILE QUICK SCREEN W PCR REFLEX
C Diff antigen: NEGATIVE
C Diff interpretation: NEGATIVE
C Diff toxin: NEGATIVE

## 2014-11-08 LAB — CBC
HEMATOCRIT: 23.9 % — AB (ref 35.0–47.0)
Hemoglobin: 8.5 g/dL — ABNORMAL LOW (ref 12.0–16.0)
MCH: 33.2 pg (ref 26.0–34.0)
MCHC: 35.5 g/dL (ref 32.0–36.0)
MCV: 93.5 fL (ref 80.0–100.0)
PLATELETS: 280 10*3/uL (ref 150–440)
RBC: 2.56 MIL/uL — AB (ref 3.80–5.20)
RDW: 15.4 % — ABNORMAL HIGH (ref 11.5–14.5)
WBC: 14.5 10*3/uL — ABNORMAL HIGH (ref 3.6–11.0)

## 2014-11-08 LAB — BASIC METABOLIC PANEL
ANION GAP: 8 (ref 5–15)
BUN: 62 mg/dL — AB (ref 6–20)
CHLORIDE: 104 mmol/L (ref 101–111)
CO2: 24 mmol/L (ref 22–32)
Calcium: 8.1 mg/dL — ABNORMAL LOW (ref 8.9–10.3)
Creatinine, Ser: 4.04 mg/dL — ABNORMAL HIGH (ref 0.44–1.00)
GFR calc Af Amer: 12 mL/min — ABNORMAL LOW (ref >60)
GFR, EST NON AFRICAN AMERICAN: 10 mL/min — AB (ref >60)
GLUCOSE: 304 mg/dL — AB (ref 65–99)
POTASSIUM: 5.3 mmol/L — AB (ref 3.5–5.1)
Sodium: 136 mmol/L (ref 135–145)

## 2014-11-08 LAB — OCCULT BLOOD X 1 CARD TO LAB, STOOL: Fecal Occult Bld: POSITIVE — AB

## 2014-11-08 MED ORDER — SACCHAROMYCES BOULARDII 250 MG PO CAPS
250.0000 mg | ORAL_CAPSULE | Freq: Two times a day (BID) | ORAL | Status: DC
  Administered 2014-11-08 – 2014-11-17 (×18): 250 mg via ORAL
  Filled 2014-11-08 (×18): qty 1

## 2014-11-08 MED ORDER — INSULIN ASPART 100 UNIT/ML ~~LOC~~ SOLN: 0.0000 [IU] | Freq: Every day | SUBCUTANEOUS | Status: DC

## 2014-11-08 MED ORDER — INSULIN ASPART 100 UNIT/ML ~~LOC~~ SOLN
3.0000 [IU] | Freq: Three times a day (TID) | SUBCUTANEOUS | Status: DC
  Administered 2014-11-08 (×2): 3 [IU] via SUBCUTANEOUS
  Filled 2014-11-08 (×2): qty 3

## 2014-11-08 MED ORDER — DEXTROSE 5 % IV SOLN
1.0000 g | INTRAVENOUS | Status: DC
  Administered 2014-11-08 – 2014-11-13 (×6): 1 g via INTRAVENOUS
  Filled 2014-11-08 (×8): qty 10

## 2014-11-08 MED ORDER — CITALOPRAM HYDROBROMIDE 20 MG PO TABS
20.0000 mg | ORAL_TABLET | Freq: Every evening | ORAL | Status: DC
  Administered 2014-11-08 – 2014-11-16 (×8): 20 mg via ORAL
  Filled 2014-11-08 (×8): qty 1

## 2014-11-08 MED ORDER — HYDRALAZINE HCL 20 MG/ML IJ SOLN: 5.0000 mg | Freq: Four times a day (QID) | INTRAMUSCULAR | Status: DC | PRN

## 2014-11-08 MED ORDER — INSULIN ASPART 100 UNIT/ML ~~LOC~~ SOLN
0.0000 [IU] | Freq: Three times a day (TID) | SUBCUTANEOUS | Status: DC
  Administered 2014-11-08: 15 [IU] via SUBCUTANEOUS
  Administered 2014-11-08: 8 [IU] via SUBCUTANEOUS
  Filled 2014-11-08: qty 8
  Filled 2014-11-08: qty 15

## 2014-11-08 MED ORDER — SODIUM POLYSTYRENE SULFONATE 15 GM/60ML PO SUSP
15.0000 g | Freq: Once | ORAL | Status: AC
  Administered 2014-11-08: 15 g via ORAL
  Filled 2014-11-08: qty 60

## 2014-11-08 MED ORDER — GUAIFENESIN-DM 100-10 MG/5ML PO SYRP
10.0000 mL | ORAL_SOLUTION | Freq: Four times a day (QID) | ORAL | Status: DC | PRN
  Administered 2014-11-11: 10 mL via ORAL
  Filled 2014-11-08: qty 10

## 2014-11-08 MED ORDER — LOPERAMIDE HCL 2 MG PO CAPS
2.0000 mg | ORAL_CAPSULE | Freq: Once | ORAL | Status: AC
  Administered 2014-11-08: 2 mg via ORAL
  Filled 2014-11-08: qty 1

## 2014-11-08 MED ORDER — GERHARDT'S BUTT CREAM
TOPICAL_CREAM | Freq: Four times a day (QID) | CUTANEOUS | Status: DC
  Administered 2014-11-09 – 2014-11-17 (×22): via TOPICAL
  Filled 2014-11-08 (×2): qty 1

## 2014-11-08 NOTE — Progress Notes (Addendum)
[facility] at [place] NAME: Felicia Vincent    MR#:  [id number]  DATE OF BIRTH:  [birth year]  SUBJECTIVE:  CHIEF COMPLAINT:  Patient is reporting diarrhea and persistent cough. Off BiPAP. Daughter is at bedside  REVIEW OF SYSTEMS:  CONSTITUTIONAL: No fever, fatigue   but reportingweakness.  EYES: No blurred or double vision.  EARS, NOSE, AND THROAT: No tinnitus or ear pain.  RESPIRATORY: reporting persistent  cough and shortness of breath,  denieswheezing or hemoptysis.  CARDIOVASCULAR: No chest pain, orthopnea, edema.  GASTROINTESTINAL: No nausea, vomiting. Reporting diarrhea but no  abdominal pain.  GENITOURINARY: No dysuria, hematuria.  ENDOCRINE: No polyuria, nocturia,  HEMATOLOGY: No anemia, easy bruising or bleeding SKIN: No rash or lesion. MUSCULOSKELETAL: No joint pain or arthritis.   NEUROLOGIC: No tingling, numbness, weakness.  PSYCHIATRY: No anxiety or depression.   DRUG ALLERGIES:   Allergies  Allergen Reactions  . Codeine Nausea Only  . Prednisone Other (See Comments)    Pts daughter states that this medication "makes her evil".      VITALS:  Blood pressure 175/63, pulse 72, temperature 98.1 F (36.7 C), temperature source Oral, resp. rate 28, height 5\' 1"  (1.549 m), weight 88 kg (194 lb 0.1 oz), SpO2 92 %.  PHYSICAL EXAMINATION:  GENERAL:  69 y.o.-year-old patient lying in the bed with no acute distress.  EYES: Pupils equal, round, reactive to light and accommodation. No scleral icterus. Extraocular muscles intact.  HEENT: Head atraumatic, normocephalic. Oropharynx and nasopharynx clear.  NECK:  Supple, no jugular venous distention. No thyroid enlargement, no tenderness.  LUNGS: coarse  breath sounds bilaterally, moderate air entry with crackles butno wheezing, rales,rhonchi or crepitation. No use of accessory muscles of respiration.  CARDIOVASCULAR: S1, S2 normal. No murmurs, rubs, or gallops.  ABDOMEN: Soft,  nontender, nondistended. Bowel sounds present. No organomegaly or mass.  EXTREMITIES: No pedal edema, cyanosis, or clubbing.  NEUROLOGIC: Cranial nerves II through XII are intact. Muscle strength 5/5 in all extremities. Sensation intact. Gait not checked.  PSYCHIATRIC: The patient is alert and oriented x 3.  SKIN: No obvious rash, lesion, or ulcer.    LABORATORY PANEL:   CBC  Recent Labs Lab 11/08/14 0554  WBC 14.5*  HGB 8.5*  HCT 23.9*  PLT 280   ------------------------------------------------------------------------------------------------------------------  Chemistries   Recent Labs Lab 11/07/14 1230  11/08/14 0554  NA 126*  --  136  K 5.6*  --  5.3*  CL 93*  --  104  CO2 23  --  24  GLUCOSE 575*  --  304*  BUN 71*  --  62*  CREATININE 4.63*  < > 4.04*  CALCIUM 7.9*  --  8.1*  AST 12*  --   --   ALT 10*  --   --   ALKPHOS 78  --   --   BILITOT 0.9  --   --   < > = values in this interval not displayed. ------------------------------------------------------------------------------------------------------------------  Cardiac Enzymes  Recent Labs Lab 11/07/14 1230  TROPONINI <0.03   ------------------------------------------------------------------------------------------------------------------  RADIOLOGY:  Dg Chest Port 1 View  11/07/2014   CLINICAL DATA:  Dyspnea.  EXAM: PORTABLE CHEST - 1 VIEW  COMPARISON:  October 31, 2014  FINDINGS: The heart size and mediastinal contours are stable. The heart size is enlarged. There is diffuse consolidation throughout the right lung. Patchy consolidation of the left mid and lung base are identified. The visualized skeletal structures are unremarkable.  IMPRESSION:  Pneumonias in bilateral lungs.   Electronically Signed   By: Felicia Vincent M.D.   On: 11/07/2014 13:29    EKG:   Orders placed or performed in visit on 11/07/14  . EKG 12-Lead    ASSESSMENT AND PLAN:    Patient is a 69 year old with bilateral  pneumonia  1, acute hypoxic respiratory failure secondary to bilateral pneumonia:  Off BiPAP , on high flow oxygen   continue with broad-spectrum IV anabiotic's Zosyn, vancomycin and azithromycin with probiotics due to severity of her pneumonia.  Pending Pulmonary consult, discussed with critical care team Dr. Jamal Vincent Provided cough medicine Will obtain sputum cultures   2. Acute renal failure on chronic renal failure due to dehydration: Baseline seems to be at 3.0 IV fluids  nephrology consult, discussed with Felicia Vincent avoid nephrotoxins Renal ultrasound was recently done at the end of April 2016 Repeat a.m. Labs Small dose Kayexalate for hyperkalemia  3. Hypertension continue amlodipine and hydralazine [place]  4. Diabetes type 2 with hyperglycemia Place patient on insulin continue her Levemir if sugars continue stay elevated may need the insulin drip since she will be in the stepdown unit   5. History of CVA continue aspirin  6. Diarrhea, can be antibiotic associated Will provide probiotics C. difficile toxin is negative  6. Miscellaneous heparin for DVT prophylaxis   All the records are reviewed and case discussed with ED provider. Management plans discussed with the patient, family and they are in agreement.     All the records are reviewed and case discussed with RN, critical care team, nephrology and Care Management/Social Workerr. Management plans discussed with the patient, and her daughter at bedside ,they are in agreement. Greater than 50% of the time was spent on coordination of care  CODE STATUS: Full code  TOTAL TIME TAKING CARE OF THIS PATIENT: 40 minutes.   POSSIBLE D/C IN 3-4 DAYS, DEPENDING ON CLINICAL CONDITION.   Felicia Vincent M.D on 11/08/2014 at 10:14 AM  Between 7am to 6pm - Pager - [phone] After 6pm go to [email or web address] - password [email or web address] [facility]  Office  [phone]  CC: Primary care physician; Felicia Guise,  MD

## 2014-11-08 NOTE — Progress Notes (Signed)
Subjective:   Patient is a 69 year old Caucasian female with diabetic kidney disease, hypertension, diabetic retinopathy, bilateral cataracts, history of TIA and chronic kidney disease. She is followed in the office for chronic kidney disease. Out patient, her renal function has been declining. Most recent labs are from sep 2016 when her GFR was 13, creatinine was 3.31 She now presents with pneumonia and is admitted to the ICU for further management Admission creatinine was 4.63. It has improved slightly over the next few days down to 4.04 Blood sugar control remains poor  Objective:  Vital signs in last 24 hours:  Temp:  [98.1 F (36.7 C)-98.7 F (37.1 C)] 98.1 F (36.7 C) (09/14 0800) Pulse Rate:  [60-72] 72 (09/14 0800) Resp:  [20-28] 28 (09/14 0800) BP: (153-185)/(52-67) 175/63 mmHg (09/14 0800) SpO2:  [86 %-99 %] 92 % (09/14 0800) FiO2 (%):  [55 %] 55 % (09/14 0623) Weight:  [86.864 kg (191 lb 8 oz)-88 kg (194 lb 0.1 oz)] 88 kg (194 lb 0.1 oz) (09/13 1725)  Weight change:  Filed Weights   11/07/14 1207 11/07/14 1725  Weight: 86.864 kg (191 lb 8 oz) 88 kg (194 lb 0.1 oz)    Intake/Output:    Intake/Output Summary (Last 24 hours) at 11/08/14 1145 Last data filed at 11/08/14 0907  Gross per 24 hour  Intake   1073 ml  Output    353 ml  Net    720 ml     Physical Exam: General:  frail, elderly, chronically ill-appearing, laying in the bed   HEENT  high flow nasal cannula, moist oral mucous membranes   Neck  supple   Pulm/lungs  bilateral basilar crackles, some rhonchi   CVS/Heart  irregular rhythm, no rub or gallop   Abdomen:   soft, nontender   Extremities:  trace peripheral edema   Neurologic:  alert, able to answer questions   Skin:  no acute rashes   Access:        Basic Metabolic Panel:   Recent Labs Lab 11/07/14 1230 11/07/14 1410 11/08/14 0554  NA 126*  --  136  K 5.6*  --  5.3*  CL 93*  --  104  CO2 23  --  24  GLUCOSE 575*  --  304*  BUN  71*  --  62*  CREATININE 4.63* 4.56* 4.04*  CALCIUM 7.9*  --  8.1*     CBC:  Recent Labs Lab 11/07/14 1230 11/07/14 1410 11/08/14 0554  WBC 11.8* 11.2* 14.5*  NEUTROABS 10.2*  --   --   HGB 7.2* 6.8* 8.5*  HCT 21.2* 20.8* 23.9*  MCV 88.8 87.1 93.5  PLT 242 294 280      Microbiology:  Recent Results (from the past 720 hour(s))  Culture, blood (routine x 2)     Status: None (Preliminary result)   Collection Time: 11/07/14 12:30 PM  Result Value Ref Range Status   Specimen Description BLOOD RIGHT ASSIST CONTROL  Final   Special Requests   Final    BOTTLES DRAWN AEROBIC AND ANAEROBIC  2CC AERO 1 CC ANAERO   Culture NO GROWTH < 24 HOURS  Final   Report Status PENDING  Incomplete  Culture, blood (routine x 2)     Status: None (Preliminary result)   Collection Time: 11/07/14 12:48 PM  Result Value Ref Range Status   Specimen Description BLOOD LEFT FATTY CASTS  Final   Special Requests BOTTLES DRAWN AEROBIC AND ANAEROBIC  [place]  Final   Culture  NO GROWTH < 24 HOURS  Final   Report Status PENDING  Incomplete  MRSA PCR Screening     Status: None   Collection Time: 11/07/14  6:06 PM  Result Value Ref Range Status   MRSA by PCR NEGATIVE NEGATIVE Final    Comment:        The GeneXpert MRSA Assay (FDA approved for NASAL specimens only), is one component of a comprehensive MRSA colonization surveillance program. It is not intended to diagnose MRSA infection nor to guide or monitor treatment for MRSA infections.   C difficile quick scan w PCR reflex     Status: None   Collection Time: 11/08/14  4:15 AM  Result Value Ref Range Status   C Diff antigen NEGATIVE NEGATIVE Final   C Diff toxin NEGATIVE NEGATIVE Final   C Diff interpretation Negative for C. difficile  Final    Coagulation Studies: No results for input(s): LABPROT, INR in the last 72 hours.  Urinalysis: No results for input(s): COLORURINE, LABSPEC, PHURINE, GLUCOSEU, HGBUR, BILIRUBINUR, KETONESUR, PROTEINUR,  UROBILINOGEN, NITRITE, LEUKOCYTESUR in the last 72 hours.  Invalid input(s): APPERANCEUR    Imaging: Dg Chest Port 1 View  11/07/2014   CLINICAL DATA:  Dyspnea.  EXAM: PORTABLE CHEST - 1 VIEW  COMPARISON:  October 31, 2014  FINDINGS: The heart size and mediastinal contours are stable. The heart size is enlarged. There is diffuse consolidation throughout the right lung. Patchy consolidation of the left mid and lung base are identified. The visualized skeletal structures are unremarkable.  IMPRESSION: Pneumonias in bilateral lungs.   Electronically Signed   By: Abelardo Diesel M.D.   On: 11/07/2014 13:29     Medications:   . sodium chloride 100 mL/hr at 11/08/14 0133   . sodium chloride   Intravenous Once  . aspirin EC  81 mg Oral Daily  . atorvastatin  10 mg Oral QHS  . azithromycin  500 mg Intravenous Q24H  . cefTRIAXone (ROCEPHIN)  IV  1 g Intravenous Q24H  . citalopram  40 mg Oral QPM  . donepezil  5 mg Oral Daily  . Gerhardt's butt cream   Topical QID  . glimepiride  2 mg Oral BID  . guaiFENesin  600 mg Oral BID  . hydrALAZINE  50 mg Oral TID  . insulin aspart  0-15 Units Subcutaneous TID WC  . insulin aspart  0-5 Units Subcutaneous QHS  . insulin aspart  3 Units Subcutaneous TID WC  . insulin detemir  30 Units Subcutaneous QPM  . isosorbide mononitrate  90 mg Oral BID  . Liraglutide  1.8 mg Subcutaneous Daily  . loperamide  2 mg Oral Once  . pneumococcal 23 valent vaccine  0.5 mL Intramuscular Tomorrow-1000  . psyllium  1 packet Oral Daily  . saccharomyces boulardii  250 mg Oral BID  . sodium polystyrene  15 g Oral Once  . sotalol  120 mg Oral BID  . [START ON 11/09/2014] vancomycin  1,000 mg Intravenous Q48H  . vitamin B-12  1,000 mcg Oral Daily   acetaminophen **OR** acetaminophen, albuterol, benzonatate, guaiFENesin-dextromethorphan, ondansetron **OR** ondansetron (ZOFRAN) IV  Assessment/ Plan:  69 y.o. female with diabetic kidney disease, hypertension, diabetic  retinopathy, bilateral cataracts, history of TIA and chronic kidney disease stage V is admitted for pneumonia  1. Acute renal failure on chronic kidney disease stage V - Likely ATN due to concurrent illness Serum creatinine is improving slowly Electrolytes and Volume status are acceptable No acute indication for Dialysis at  present   2.  Acute respiratory failure secondary to bilateral pneumonia - Currently high flow nasal cannula oxygen supplementation - Broad-spectrum antibiotics as per hospitalist team  3. Hypertension  - Avoid Ace inhibitor or ARB at this time due to advanced renal failure and acute renal failure - Sotalol, hydralazine, Imdur, furosemide  4. Hyperkalemia - Potassium level improved some to 5.3 - Low K diet   5. Diabetes type 2 with CKD - Difficult to achieve better blood sugar control likely due to underlying infection    LOS: 1 Felicia Vincent 9/14/201611:45 AM

## 2014-11-08 NOTE — Progress Notes (Signed)
ANTIBIOTIC CONSULT NOTE - INITIAL  Pharmacy Consult for vancomycin dosing/monitoring Indication: pneumonia  Allergies  Allergen Reactions  . Codeine Nausea Only  . Prednisone Other (See Comments)    Pts daughter states that this medication "makes her evil".      Patient Measurements: Height: 5\' 1"  (154.9 cm) Weight: 194 lb 0.1 oz (88 kg) IBW/kg (Calculated) : 47.8 Adjusted Body Weight: 63.9 kg  Vital Signs: Temp: 98.1 F (36.7 C) (09/14 0800) Temp Source: Oral (09/14 0122) BP: 175/63 mmHg (09/14 0800) Pulse Rate: 72 (09/14 0800) Intake/Output from previous day: 09/13 0701 - 09/14 0700 In: 1073 [I.V.:700; Blood:323; IV Piggyback:50] Out: 100 [Urine:100] Intake/Output from this shift: Total I/O In: -  Out: 253 [Urine:251; Stool:2]  Labs:  Recent Labs  11/07/14 1230 11/07/14 1410 11/08/14 0554  WBC 11.8* 11.2* 14.5*  HGB 7.2* 6.8* 8.5*  PLT 242 294 280  CREATININE 4.63* 4.56* 4.04*   Estimated Creatinine Clearance: 12.9 mL/min (by C-G formula based on Cr of 4.04). No results for input(s): VANCOTROUGH, VANCOPEAK, VANCORANDOM, GENTTROUGH, GENTPEAK, GENTRANDOM, TOBRATROUGH, TOBRAPEAK, TOBRARND, AMIKACINPEAK, AMIKACINTROU, AMIKACIN in the last 72 hours.   Microbiology: Recent Results (from the past 720 hour(s))  Culture, blood (routine x 2)     Status: None (Preliminary result)   Collection Time: 11/07/14 12:30 PM  Result Value Ref Range Status   Specimen Description BLOOD RIGHT ASSIST CONTROL  Final   Special Requests   Final    BOTTLES DRAWN AEROBIC AND ANAEROBIC  2CC AERO 1 CC ANAERO   Culture NO GROWTH < 24 HOURS  Final   Report Status PENDING  Incomplete  Culture, blood (routine x 2)     Status: None (Preliminary result)   Collection Time: 11/07/14 12:48 PM  Result Value Ref Range Status   Specimen Description BLOOD LEFT FATTY CASTS  Final   Special Requests BOTTLES DRAWN AEROBIC AND ANAEROBIC  1CC  Final   Culture NO GROWTH < 24 HOURS  Final   Report  Status PENDING  Incomplete  MRSA PCR Screening     Status: None   Collection Time: 11/07/14  6:06 PM  Result Value Ref Range Status   MRSA by PCR NEGATIVE NEGATIVE Final    Comment:        The GeneXpert MRSA Assay (FDA approved for NASAL specimens only), is one component of a comprehensive MRSA colonization surveillance program. It is not intended to diagnose MRSA infection nor to guide or monitor treatment for MRSA infections.   C difficile quick scan w PCR reflex     Status: None   Collection Time: 11/08/14  4:15 AM  Result Value Ref Range Status   C Diff antigen NEGATIVE NEGATIVE Final   C Diff toxin NEGATIVE NEGATIVE Final   C Diff interpretation Negative for C. difficile  Final    Medical History: Past Medical History  Diagnosis Date  . Hypertension   . Diabetes mellitus without complication   . TIA (transient ischemic attack)   . Cataract   . Stroke   . Dizziness   . Anemia   . Pneumonia     Medications:  Anti-infectives    Start     Dose/Rate Route Frequency Ordered Stop   11/09/14 1500  vancomycin (VANCOCIN) IVPB 1000 mg/200 mL premix     1,000 mg 200 mL/hr over 60 Minutes Intravenous Every 48 hours 11/07/14 1615     11/08/14 1130  cefTRIAXone (ROCEPHIN) 1 g in dextrose 5 % 50 mL IVPB  1 g 100 mL/hr over 30 Minutes Intravenous Every 24 hours 11/08/14 1117     11/08/14 1000  piperacillin-tazobactam (ZOSYN) IVPB 3.375 g  Status:  Discontinued     3.375 g 12.5 mL/hr over 240 Minutes Intravenous Every 12 hours 11/07/14 2322 11/08/14 1117   11/07/14 1430  piperacillin-tazobactam (ZOSYN) IVPB 3.375 g  Status:  Discontinued     3.375 g 12.5 mL/hr over 240 Minutes Intravenous 3 times per day 11/07/14 1428 11/07/14 2322   11/07/14 1430  azithromycin (ZITHROMAX) 500 mg in dextrose 5 % 250 mL IVPB     500 mg 250 mL/hr over 60 Minutes Intravenous Every 24 hours 11/07/14 1428     11/07/14 1415  piperacillin-tazobactam (ZOSYN) IVPB 3.375 g     3.375 g 12.5 mL/hr  over 240 Minutes Intravenous  Once 11/07/14 1405 11/07/14 1537   11/07/14 1415  vancomycin (VANCOCIN) IVPB 1000 mg/200 mL premix     1,000 mg 200 mL/hr over 60 Minutes Intravenous  Once 11/07/14 1405 11/07/14 1607     Assessment: 69 y/o F with acute respiratory failure due to B/L PNA ordered empiric abx.   Goal of Therapy:  Vancomycin trough level 15-20 mcg/ml  Plan:  Will continue vancomycin 1000 mg iv q 48 hours and tentatively plan on checking a trough with the 3rd dose to r/o accumulation. May have to change dose as renal function moves back towards baseline. Will f/u culture results.  Ulice Dash D 11/08/2014,11:40 AM

## 2014-11-08 NOTE — Consult Note (Signed)
PULMONARY / CRITICAL CARE MEDICINE   Name: Felicia Vincent MRN: [id number] DOB: 22-Jun-1945    ADMISSION DATE:  11/07/2014  INITIAL PRESENTATION:  56 F initially seen as outpt one week PTA with complaints of weakness, dyspnea, cough and treated for PNA as outpt with levofloxacin. Was intolerant of this medication due to nausea and was changed to amoxicillin 4 days PTA. Seen in ED 9/13 with worsening dyspnea and admitted with dx of acute hypoxic resp failure due to CAP  MAJOR EVENTS/TEST RESULTS: 9/13 transferred one unit PRBCs for Hgb 6.8  INDWELLING DEVICES::   MICRO DATA: MRSA PCR 9/13 >> NEG C diff PCR 9/14 >> NEG Legionella Ag 9/14 >>  Strep Ag 9/14 >>  Blood 9/13 >>   ANTIMICROBIALS:  Pip-tazo 9/13 >> 9/14 Vanc 9/13 >>   Azithromycin 9/13 >>  Ceftriaxone 9/14 >>    HISTORY OF PRESENT ILLNESS:  As above. Also c/o diarhhea and subj fevers with chills and sweats. Denies pleurodynia, anginal CP, abdominal pain, LE edema and calf tenderness  PAST MEDICAL HISTORY :   has a past medical history of Hypertension; Diabetes mellitus without complication; TIA (transient ischemic attack); Cataract; Stroke; Dizziness; Anemia; and Pneumonia.  has past surgical history that includes Cholecystectomy; Lithotripsy; Abdominal hysterectomy; and Breast surgery. Prior to Admission medications   Medication Sig Start Date End Date Taking? Authorizing Provider  albuterol (PROVENTIL HFA;VENTOLIN HFA) 108 (90 BASE) MCG/ACT inhaler Inhale 2 puffs into the lungs every 6 (six) hours as needed for wheezing or shortness of breath. 11/01/14  Yes Gregor Hams, MD  amLODipine (NORVASC) 5 MG tablet Take 5 mg by mouth at bedtime.   Yes Historical Provider, MD  amoxicillin-clavulanate (AUGMENTIN) 875-125 MG per tablet Take 1 tablet by mouth 2 (two) times daily. 11/02/14 11/12/14 Yes Historical Provider, MD  aspirin EC 81 MG tablet Take 81 mg by mouth daily.   Yes Historical Provider, MD  atorvastatin (LIPITOR) 10  MG tablet Take 10 mg by mouth at bedtime.   Yes Historical Provider, MD  benzonatate (TESSALON) 200 MG capsule Take 200 mg by mouth 3 (three) times daily as needed for cough.   Yes Historical Provider, MD  citalopram (CELEXA) 40 MG tablet Take 40 mg by mouth every evening.   Yes Historical Provider, MD  clopidogrel (PLAVIX) 75 MG tablet Take 75 mg by mouth at bedtime.    Yes Historical Provider, MD  donepezil (ARICEPT) 5 MG tablet Take 5 mg by mouth daily.    Yes Historical Provider, MD  furosemide (LASIX) 20 MG tablet Take 20 mg by mouth daily.   Yes Historical Provider, MD  glimepiride (AMARYL) 4 MG tablet Take 2 mg by mouth 2 (two) times daily.   Yes Historical Provider, MD  hydrALAZINE (APRESOLINE) 25 MG tablet Take 50 mg by mouth 3 (three) times daily.   Yes Historical Provider, MD  insulin detemir (LEVEMIR) 100 UNIT/ML injection Inject 30-35 Units into the skin every evening.   Yes Historical Provider, MD  isosorbide mononitrate (IMDUR) 60 MG 24 hr tablet Take 90 mg by mouth 2 (two) times daily.   Yes Historical Provider, MD  Liraglutide (VICTOZA) 18 MG/3ML SOPN Inject 1.8 mg into the skin daily.   Yes Historical Provider, MD  ondansetron (ZOFRAN) 4 MG tablet Take 4 mg by mouth 3 (three) times daily as needed for nausea or vomiting.   Yes Historical Provider, MD  polycarbophil (FIBERCON) 625 MG tablet Take 625 mg by mouth daily.   Yes Historical Provider,  MD  sotalol (BETAPACE) 120 MG tablet Take 120 mg by mouth 2 (two) times daily.   Yes Historical Provider, MD  vitamin B-12 (CYANOCOBALAMIN) 1000 MCG tablet Take 1,000 mcg by mouth daily.   Yes Historical Provider, MD  acetaminophen-codeine (TYLENOL #3) 300-30 MG per tablet Take 2 tablets by mouth every 8 (eight) hours as needed for moderate pain. Patient not taking: Reported on 11/07/2014 11/01/14   Gregor Hams, MD   Allergies  Allergen Reactions  . Codeine Nausea Only  . Prednisone Other (See Comments)    Pts daughter states that this  medication "makes her evil".      FAMILY HISTORY:  has no family status information on file.  SOCIAL HISTORY:  reports that she has never smoked. She has never used smokeless tobacco. She reports that she does not drink alcohol or use illicit drugs.  REVIEW OF SYSTEMS: As above. Otherwise, noncontibutory  SUBJECTIVE:   VITAL SIGNS: Temp:  [98.1 F (36.7 C)-98.7 F (37.1 C)] 98.3 F (36.8 C) (09/14 1000) Pulse Rate:  [62-72] 71 (09/14 1515) Resp:  [17-28] 26 (09/14 1515) BP: (153-185)/(52-85) 176/63 mmHg (09/14 1515) SpO2:  [84 %-94 %] 90 % (09/14 1515) FiO2 (%):  [50 %-65 %] 50 % (09/14 1204) Weight:  [88 kg (194 lb 0.1 oz)] 88 kg (194 lb 0.1 oz) (09/13 1725) HEMODYNAMICS:   VENTILATOR SETTINGS: Vent Mode:  [-]  FiO2 (%):  [50 %-65 %] 50 % INTAKE / OUTPUT:  Intake/Output Summary (Last 24 hours) at 11/08/14 1536 Last data filed at 11/08/14 0907  Gross per 24 hour  Intake   1073 ml  Output    353 ml  Net    720 ml    PHYSICAL EXAMINATION: General: No overt distress, appears fatigued Neuro: CNs intact, motor/sens intact, DTRs symmetric HEENT: NCAT Necy: no jvd Cardiovascular: reg, no M Lungs: coarse rhonchi R>L, no wheezes Abdomen: Soft, NT, +BS Ext: no C/C/E  LABS:  CBC  Recent Labs Lab 11/07/14 1230 11/07/14 1410 11/08/14 0554  WBC 11.8* 11.2* 14.5*  HGB 7.2* 6.8* 8.5*  HCT 21.2* 20.8* 23.9*  PLT 242 294 280   Coag's No results for input(s): APTT, INR in the last 168 hours. BMET  Recent Labs Lab 11/07/14 1230 11/07/14 1410 11/08/14 0554  NA 126*  --  136  K 5.6*  --  5.3*  CL 93*  --  104  CO2 23  --  24  BUN 71*  --  62*  CREATININE 4.63* 4.56* 4.04*  GLUCOSE 575*  --  304*   Electrolytes  Recent Labs Lab 11/07/14 1230 11/08/14 0554  CALCIUM 7.9* 8.1*   Sepsis Markers  Recent Labs Lab 11/07/14 1248 11/07/14 1802  LATICACIDVEN 1.0 1.5   ABG  Recent Labs Lab 11/07/14 1340  PHART 7.39  PCO2ART 38  PO2ART 71*   Liver  Enzymes  Recent Labs Lab 11/07/14 1230  AST 12*  ALT 10*  ALKPHOS 78  BILITOT 0.9  ALBUMIN 2.4*   Cardiac Enzymes  Recent Labs Lab 11/07/14 1230  TROPONINI <0.03   Glucose  Recent Labs Lab 11/07/14 1704 11/07/14 2053 11/08/14 0749 11/08/14 1117  GLUCAP 478* 290* 321* 294*    CXR:   ASSESSMENT / PLAN:  PULMONARY A: Acute hypoxic resp failure due to CAP, NOS P:   Cont high flow O2 by [place]  CARDIOVASCULAR A:  Hypertension P:  Cont current antihypertensive regimen Goal SBP < 170 mmHg  RENAL A:  AKI, nonoliguric -  improving CKD Mild hyperkalemia P:   Monitor BMET intermittently Monitor I/Os Correct electrolytes as indicated Avoid nephrotoxins Renal Service consult requested by primary team  GASTROINTESTINAL A:   No issues P:   SUP: N/I presently Cont carb mod diet GI consult requested by primary team for unexplained anemia  HEMATOLOGIC A:   Anemia, unclear if component of acute blood loss P:  DVT px: SQ heparin Monitor CBC intermittently Transfuse per usual guidelines   INFECTIOUS A:   CAP, NOS P:   Monitor temp, WBC count Micro and abx as above  ENDOCRINE A:  DM2, poorly controlled P:   Cont Levemir and oral agent SSI adjusted Goal CBG < 180  NEUROLOGIC A: No issues P:   RASS goal: 0 Monitor    Merton Border, MD PCCM service Mobile [phone] Pager [phone]   11/08/2014, 3:36 PM

## 2014-11-08 NOTE — Progress Notes (Addendum)
Inpatient Diabetes Program Recommendations  AACE/ADA: New Consensus Statement on Inpatient Glycemic Control (2015)  Target Ranges:  Prepandial:   less than 140 mg/dL      Peak postprandial:   less than 180 mg/dL (1-2 hours)      Critically ill patients:  140 - 180 mg/dL   Results for Felicia Vincent, Felicia Vincent (MRN [id number]) as of 11/08/2014 09:10  Ref. Range 11/07/2014 17:04 11/07/2014 20:53 11/08/2014 07:49  Glucose-Capillary Latest Ref Range: 65-99 mg/dL 478 (H) 290 (H) 321 (H)    Review of Glycemic Control  Diabetes history: DM2 Outpatient Diabetes medications: Levemir 30-35 units QPM, Amaryl 2 mg BID, Victoza 1.8 mg daily Current orders for Inpatient glycemic control: Levemir 30 units QPM, Novolog 0-9 units TID with meals, Amaryl 2 mg BID, Victoza 1.8 mg daily  Inpatient Diabetes Program Recommendations: Insulin - Basal: Patient did not receive any Levemir yesterday. Fasting glucose is 321 mg/dl this morning. Please consider changing administration time of Levemir to daily starting now so patient receives basal insulin now to help prevent further elevation of glucose. A1C: Please consider ordering an A1C to evaluate glycemic control over the past 2-3 months.  Thanks, Barnie Alderman, RN, MSN, CCRN, CDE Diabetes Coordinator Inpatient Diabetes Program [phone] (Team Pager from [place] to [place]) [phone] (AP office) [phone] [facility] office) [phone] [facility] office)

## 2014-11-08 NOTE — Consult Note (Signed)
GI Inpatient Consult Note  Reason for Consult: Anemia   Attending Requesting Consult: Posey Pronto  History of Present Illness: Felicia Vincent is a 69 y.o. female with a history of HTN, DM, CKD, and h/o CVA on Plavix admitted for pneumonia.  She presented to the [facility] ED for significant SOB, intermittent fevers, dry cough, and malaise x 1 week.  She presented to the ED for the same one week ago and was d/c with Levaquin for R-sided PNA, but she became nauseous and could not tolerate the antibiotic.  PCP switched her to amoxicillin on Friday, but her SOB and cough continued worsen.  In the ED, CXR revealed R-lower lobe PNA.  Significant labs include Hgb 7.2, Hct 21.2 (in ED last week Hgb 8.2, Hct 25.1), iron/TIBC and ferritin WNL.  Hemoccult cards pending.  She was placed on BiPAP and admitted for further evaluation and management.  Since admission, Hgb dropped to 6.8 on repeat CBC.  Patient received 1 unit PRBCs, which improved Hgb to 8.5 this morning.  FOBT returned heme +.  Felicia Vincent reports she has not had any GI symptoms recently, except for the nausea and vomiting after starting Levaquin.  She denies abdominal pain, nausea, vomiting, change in bowel habits, hematochezia, and melena.  She reports intermittent fevers but feels they are related to her PNA, otherwise no weight loss or appetite changes.  No previous GIBs, no family history of CCA or other GI malignancy.  She is currently on Plavix daily with last dose yesterday.   Last colonoscopy: 10/10/03 Vira Agar) - internal hemorrhoids, otherwise normal; repeat in 10 years Last endoscopy: 10/10/03 Vira Agar) - normal esophagus, localized erythema and granularity in cardia (chronic gastritis), normal duodenum but bx taken to r/o celiac (negative)  Past Medical History:  Past Medical History  Diagnosis Date  . Hypertension   . Diabetes mellitus without complication   . TIA (transient ischemic attack)   . Cataract   . Stroke   . Dizziness   .  Anemia   . Pneumonia     Problem List: Patient Active Problem List   Diagnosis Date Noted  . Pneumonia 11/07/2014  . Acute respiratory failure 11/07/2014  . Anemia 07/10/2014    Past Surgical History: Past Surgical History  Procedure Laterality Date  . Cholecystectomy    . Lithotripsy    . Abdominal hysterectomy    . Breast surgery      Allergies: Allergies  Allergen Reactions  . Codeine Nausea Only  . Prednisone Other (See Comments)    Pts daughter states that this medication "makes her evil".      Home Medications: Prescriptions prior to admission  Medication Sig Dispense Refill Last Dose  . albuterol (PROVENTIL HFA;VENTOLIN HFA) 108 (90 BASE) MCG/ACT inhaler Inhale 2 puffs into the lungs every 6 (six) hours as needed for wheezing or shortness of breath. 1 Inhaler 2 11/07/2014 at Unknown time  . amLODipine (NORVASC) 5 MG tablet Take 5 mg by mouth at bedtime.   11/06/2014 at Unknown time  . amoxicillin-clavulanate (AUGMENTIN) 875-125 MG per tablet Take 1 tablet by mouth 2 (two) times daily.   11/07/2014 at Unknown time  . aspirin EC 81 MG tablet Take 81 mg by mouth daily.   11/07/2014 at 0900  . atorvastatin (LIPITOR) 10 MG tablet Take 10 mg by mouth at bedtime.   11/06/2014 at Unknown time  . benzonatate (TESSALON) 200 MG capsule Take 200 mg by mouth 3 (three) times daily as needed for cough.  11/07/2014 at Unknown time  . citalopram (CELEXA) 40 MG tablet Take 40 mg by mouth every evening.   11/06/2014 at Unknown time  . clopidogrel (PLAVIX) 75 MG tablet Take 75 mg by mouth at bedtime.    11/06/2014 at Unknown time  . donepezil (ARICEPT) 5 MG tablet Take 5 mg by mouth daily.    11/07/2014 at Unknown time  . furosemide (LASIX) 20 MG tablet Take 20 mg by mouth daily.   11/07/2014 at Unknown time  . glimepiride (AMARYL) 4 MG tablet Take 2 mg by mouth 2 (two) times daily.   11/07/2014 at Unknown time  . hydrALAZINE (APRESOLINE) 25 MG tablet Take 50 mg by mouth 3 (three) times daily.    11/07/2014 at Unknown time  . insulin detemir (LEVEMIR) 100 UNIT/ML injection Inject 30-35 Units into the skin every evening.   11/06/2014 at Unknown time  . isosorbide mononitrate (IMDUR) 60 MG 24 hr tablet Take 90 mg by mouth 2 (two) times daily.   11/07/2014 at Unknown time  . Liraglutide (VICTOZA) 18 MG/3ML SOPN Inject 1.8 mg into the skin daily.   11/07/2014 at Unknown time  . ondansetron (ZOFRAN) 4 MG tablet Take 4 mg by mouth 3 (three) times daily as needed for nausea or vomiting.   11/07/2014 at Unknown time  . polycarbophil (FIBERCON) 625 MG tablet Take 625 mg by mouth daily.   11/07/2014 at Unknown time  . sotalol (BETAPACE) 120 MG tablet Take 120 mg by mouth 2 (two) times daily.   11/07/2014 at 0900  . vitamin B-12 (CYANOCOBALAMIN) 1000 MCG tablet Take 1,000 mcg by mouth daily.   11/07/2014 at Unknown time  . acetaminophen-codeine (TYLENOL #3) 300-30 MG per tablet Take 2 tablets by mouth every 8 (eight) hours as needed for moderate pain. (Patient not taking: Reported on 11/07/2014) 30 tablet 0   . [EXPIRED] levofloxacin (LEVAQUIN) 500 MG tablet Take 1 tablet (500 mg total) by mouth daily. (Patient not taking: Reported on 11/07/2014) 7 tablet 0    Home medication reconciliation was completed with the patient.   Scheduled Inpatient Medications:   . sodium chloride   Intravenous Once  . aspirin EC  81 mg Oral Daily  . atorvastatin  10 mg Oral QHS  . azithromycin  500 mg Intravenous Q24H  . citalopram  40 mg Oral QPM  . donepezil  5 mg Oral Daily  . glimepiride  2 mg Oral BID  . guaiFENesin  600 mg Oral BID  . hydrALAZINE  50 mg Oral TID  . insulin aspart  0-9 Units Subcutaneous TID WC  . insulin detemir  30 Units Subcutaneous QPM  . isosorbide mononitrate  90 mg Oral BID  . Liraglutide  1.8 mg Subcutaneous Daily  . piperacillin-tazobactam (ZOSYN)  IV  3.375 g Intravenous Q12H  . pneumococcal 23 valent vaccine  0.5 mL Intramuscular Tomorrow-1000  . psyllium  1 packet Oral Daily  . sotalol   120 mg Oral BID  . [START ON 11/09/2014] vancomycin  1,000 mg Intravenous Q48H  . vitamin B-12  1,000 mcg Oral Daily    Continuous Inpatient Infusions:   . sodium chloride 100 mL/hr at 11/08/14 0133    PRN Inpatient Medications:  acetaminophen **OR** acetaminophen, albuterol, benzonatate, ondansetron **OR** ondansetron (ZOFRAN) IV  Family History: The patient's family history is negative for inflammatory bowel disorders, GI malignancy, or solid organ transplantation.  Social History:   reports that she has never smoked. She has never used smokeless tobacco. She reports that  she does not drink alcohol or use illicit drugs.  Review of Systems: Constitutional: Weight is stable.  Eyes: No changes in vision. ENT: No oral lesions, sore throat.  GI: see HPI.  Heme/Lymph: No easy bruising.  CV: No chest pain.  GU: No hematuria.  Integumentary: No rashes.  Neuro: No headaches.  Psych: No depression/anxiety.  Endocrine: No heat/cold intolerance.  Allergic/Immunologic: No urticaria.  Resp: No cough, SOB.  Musculoskeletal: No joint swelling.    Physical Examination: BP 175/63 mmHg  Pulse 72  Temp(Src) 98.1 F (36.7 C) (Oral)  Resp 28  Ht 5\' 1"  (1.549 m)  Wt 88 kg (194 lb 0.1 oz)  BMI 36.68 kg/m2  SpO2 92% Gen: Alert and oriented x 4, appearing uncomfortable on BiPAP, coughing intermittently HEENT: PEERLA, EOMI Neck: supple, no JVD or thyromegaly Chest: CTA bilaterally, no wheezes, crackles, or other adventitious sounds CV: RRR, no m/g/c/r Abd: soft, NT, ND, +BS in all four quadrants; no HSM, guarding, ridigity, or rebound tenderness Ext: no edema, well perfused with 2+ pulses, Skin: no rash or lesions noted Lymph: no LAD  Data: Lab Results  Component Value Date   WBC 14.5* 11/08/2014   HGB 8.5* 11/08/2014   HCT 23.9* 11/08/2014   MCV 93.5 11/08/2014   PLT 280 11/08/2014    Recent Labs Lab 11/07/14 1230 11/07/14 1410 11/08/14 0554  HGB 7.2* 6.8* 8.5*   Lab  Results  Component Value Date   NA 136 11/08/2014   K 5.3* 11/08/2014   CL 104 11/08/2014   CO2 24 11/08/2014   BUN 62* 11/08/2014   CREATININE 4.04* 11/08/2014   Lab Results  Component Value Date   ALT 10* 11/07/2014   AST 12* 11/07/2014   ALKPHOS 78 11/07/2014   BILITOT 0.9 11/07/2014   No results for input(s): APTT, INR, PTT in the last 168 hours.   Assessment/Plan: Felicia Vincent is a 69 y.o. female with a history of HTN, DM, CKD, and h/o CVA admitted for pneumonia.  Hgb 7.2 in ED, dropped to 6.8 during admission.  1 unit PRBCs raised Hgb to 8.5.  FOBT returned heme +.  She is hemodynamically stable, still on BiPAP.  She reports so nausea and vomiting after starting Levaquin for PNA, otherwise no GI symptoms.  She is on Plavix daily with last dose taken yesterday.  Given her present PNA and respiratory difficulty, but Hgb improved after 1 unit PRBCs, I recommend outpatient follow-up with [facility] for colonoscopy and upper endoscopy.  She is a poor surgical candidate at this time, so would consider inpatient procedures if Hgb drops despite transfusions.  We will continue to monitor Hgb, transfuse if <7.  Recommendations:  Anemia: - Follow Hgb, transfuse in <7  - Plan for outpatient follow-up at [facility] for colonoscopy and upper endoscopy - If Hgb drops, will re-consider procedures as inpatient   Thank you for the consult. We will follow along with you. Please call with questions or concerns.  Lavera Guise, PA-C [facility] Phone: [phone] Pager: [phone]

## 2014-11-08 NOTE — Clinical Documentation Improvement (Signed)
Hospitalist  Can the diagnosis of anemia be further specified?   Iron deficiency Anemia  Acute Blood Loss Anemia, including the suspected or known cause  Nutritional anemia, including the nutrition or mineral deficits  Acute on Chronic Blood Loss Anemia, including the suspected or known cause  Anemia of chronic disease, including the associated chronic disease state  Other  Clinically Undetermined  Document any associated diagnoses/conditions.   Supporting Information: Patient with a history of anemia per 9/13 progress notes. 11/07/14: Order to tranfuse unit of PRBC.  Labs:  H/H: 9/13: 7.2/21.2. 9/13: 6.8/20.8. 9/14: 8.5/23.9.   Please exercise your independent, professional judgment when responding. A specific answer is not anticipated or expected.   Thank You,  [place] [phone]

## 2014-11-08 NOTE — Progress Notes (Signed)
Spoke with Dr Lavetta Nielsen for order clarifications.  New orders to come.

## 2014-11-09 ENCOUNTER — Inpatient Hospital Stay: Payer: Medicare Other

## 2014-11-09 DIAGNOSIS — E1165 Type 2 diabetes mellitus with hyperglycemia: Secondary | ICD-10-CM

## 2014-11-09 DIAGNOSIS — E1129 Type 2 diabetes mellitus with other diabetic kidney complication: Secondary | ICD-10-CM

## 2014-11-09 LAB — BASIC METABOLIC PANEL
ANION GAP: 6 (ref 5–15)
BUN: 48 mg/dL — ABNORMAL HIGH (ref 6–20)
CALCIUM: 7.9 mg/dL — AB (ref 8.9–10.3)
CHLORIDE: 106 mmol/L (ref 101–111)
CO2: 26 mmol/L (ref 22–32)
Creatinine, Ser: 3.09 mg/dL — ABNORMAL HIGH (ref 0.44–1.00)
GFR calc non Af Amer: 14 mL/min — ABNORMAL LOW (ref >60)
GFR, EST AFRICAN AMERICAN: 16 mL/min — AB (ref >60)
Glucose, Bld: 51 mg/dL — ABNORMAL LOW (ref 65–99)
Potassium: 5.7 mmol/L — ABNORMAL HIGH (ref 3.5–5.1)
SODIUM: 138 mmol/L (ref 135–145)

## 2014-11-09 LAB — CBC
HCT: 24 % — ABNORMAL LOW (ref 35.0–47.0)
Hemoglobin: 8.2 g/dL — ABNORMAL LOW (ref 12.0–16.0)
MCH: 31.6 pg (ref 26.0–34.0)
MCHC: 34.3 g/dL (ref 32.0–36.0)
MCV: 92.2 fL (ref 80.0–100.0)
Platelets: 351 10*3/uL (ref 150–440)
RBC: 2.61 MIL/uL — AB (ref 3.80–5.20)
RDW: 15.8 % — ABNORMAL HIGH (ref 11.5–14.5)
WBC: 22.1 10*3/uL — ABNORMAL HIGH (ref 3.6–11.0)

## 2014-11-09 LAB — GLUCOSE, CAPILLARY
GLUCOSE-CAPILLARY: 110 mg/dL — AB (ref 65–99)
GLUCOSE-CAPILLARY: 62 mg/dL — AB (ref 65–99)
GLUCOSE-CAPILLARY: 260 mg/dL — AB (ref 65–99)
GLUCOSE-CAPILLARY: 224 mg/dL — AB (ref 65–99)
Glucose-Capillary: 185 mg/dL — ABNORMAL HIGH (ref 65–99)
Glucose-Capillary: 168 mg/dL — ABNORMAL HIGH (ref 65–99)

## 2014-11-09 LAB — OCCULT BLOOD X 1 CARD TO LAB, STOOL: Fecal Occult Bld: POSITIVE — AB

## 2014-11-09 MED ORDER — ACETYLCYSTEINE 10 % IN SOLN
4.0000 mL | Freq: Two times a day (BID) | RESPIRATORY_TRACT | Status: DC
  Filled 2014-11-09 (×3): qty 4

## 2014-11-09 MED ORDER — ACETYLCYSTEINE 20 % IN SOLN
4.0000 mL | Freq: Two times a day (BID) | RESPIRATORY_TRACT | Status: DC
  Administered 2014-11-10: 4 mL via RESPIRATORY_TRACT

## 2014-11-09 MED ORDER — ALBUTEROL SULFATE (2.5 MG/3ML) 0.083% IN NEBU
2.5000 mg | INHALATION_SOLUTION | Freq: Four times a day (QID) | RESPIRATORY_TRACT | Status: DC
  Administered 2014-11-09 – 2014-11-11 (×7): 2.5 mg via RESPIRATORY_TRACT
  Filled 2014-11-09 (×7): qty 3

## 2014-11-09 MED ORDER — INSULIN DETEMIR 100 UNIT/ML ~~LOC~~ SOLN
20.0000 [IU] | Freq: Every evening | SUBCUTANEOUS | Status: DC
  Administered 2014-11-09: 20 [IU] via SUBCUTANEOUS
  Filled 2014-11-09 (×2): qty 0.2

## 2014-11-09 MED ORDER — FUROSEMIDE 10 MG/ML IJ SOLN
40.0000 mg | Freq: Once | INTRAMUSCULAR | Status: AC
  Administered 2014-11-09: 40 mg via INTRAVENOUS
  Filled 2014-11-09: qty 4

## 2014-11-09 MED ORDER — INSULIN ASPART 100 UNIT/ML ~~LOC~~ SOLN
0.0000 [IU] | Freq: Three times a day (TID) | SUBCUTANEOUS | Status: DC
  Administered 2014-11-09: 2 [IU] via SUBCUTANEOUS
  Administered 2014-11-09: 5 [IU] via SUBCUTANEOUS
  Filled 2014-11-09: qty 5
  Filled 2014-11-09: qty 2

## 2014-11-09 MED ORDER — INSULIN ASPART 100 UNIT/ML ~~LOC~~ SOLN
0.0000 [IU] | Freq: Every day | SUBCUTANEOUS | Status: DC
  Administered 2014-11-09: 2 [IU] via SUBCUTANEOUS
  Filled 2014-11-09: qty 2

## 2014-11-09 MED ORDER — INSULIN ASPART 100 UNIT/ML ~~LOC~~ SOLN: 3.0000 [IU] | Freq: Three times a day (TID) | SUBCUTANEOUS | Status: DC

## 2014-11-09 MED ORDER — SODIUM POLYSTYRENE SULFONATE 15 GM/60ML PO SUSP
15.0000 g | Freq: Once | ORAL | Status: AC
  Administered 2014-11-09: 15 g via ORAL
  Filled 2014-11-09: qty 60

## 2014-11-09 NOTE — Plan of Care (Signed)
Problem: Phase I Progression Outcomes Goal: Dyspnea controlled at rest Outcome: Not Progressing Pt experiences dyspnea at rest. Tachypnea from 24-32 respirations per minutes and shallow breathing through out shift. Sats maintained between 88-92%. Pt states she has been short of breath slightly throughout shift but is not uncomfortable.

## 2014-11-09 NOTE — Progress Notes (Signed)
PULMONARY / CRITICAL CARE MEDICINE   Name: Felicia Vincent MRN: [id number] DOB: [birth year]    ADMISSION DATE:  11/07/2014  INITIAL PRESENTATION:  49 F initially seen as outpt one week PTA with complaints of weakness, dyspnea, cough and treated for PNA as outpt with levofloxacin. Was intolerant of this medication due to nausea and was changed to amoxicillin 4 days PTA. Seen in ED 9/13 with worsening dyspnea and admitted with dx of acute hypoxic resp failure due to CAP  MAJOR EVENTS/TEST RESULTS: 9/13 transferred one unit PRBCs for Hgb 6.8  INDWELLING DEVICES::   MICRO DATA: MRSA PCR 9/13 >> NEG C diff PCR 9/14 >> NEG Legionella Ag 9/14 >>  Strep Ag 9/14 >>  Blood 9/13 >>   ANTIMICROBIALS:  Pip-tazo 9/13 >> 9/14 Vanc 9/13 >>   Azithromycin 9/13 >>  Ceftriaxone 9/14 >>    SUBJECTIVE:  Looks better. Remains mildly dyspneic on high flow [place] O2  VITAL SIGNS: Temp:  [98.4 F (36.9 C)-98.6 F (37 C)] 98.4 F (36.9 C) (09/15 0900) Pulse Rate:  [65-75] 75 (09/15 0900) Resp:  [15-33] 31 (09/15 0900) BP: (147-179)/(58-104) 174/70 mmHg (09/15 0900) SpO2:  [82 %-97 %] 87 % (09/15 0900) FiO2 (%):  [50 %-80 %] 80 % (09/15 0340) HEMODYNAMICS:   VENTILATOR SETTINGS: Vent Mode:  [-]  FiO2 (%):  [50 %-80 %] 80 % INTAKE / OUTPUT:  Intake/Output Summary (Last 24 hours) at 11/09/14 1215 Last data filed at 11/09/14 0900  Gross per 24 hour  Intake   1760 ml  Output      0 ml  Net   1760 ml    PHYSICAL EXAMINATION: General: Mildly dyspneic Neuro: CNs intact, motor/sens intact, DTRs symmetric HEENT: NCAT Necy: no jvd Cardiovascular: reg, no M Lungs: coarse rales R>L, no wheezes Abdomen: Soft, NT, +BS Ext: no C/C/E  LABS:  CBC  Recent Labs Lab 11/07/14 1410 11/08/14 0554 11/09/14 0433  WBC 11.2* 14.5* 22.1*  HGB 6.8* 8.5* 8.2*  HCT 20.8* 23.9* 24.0*  PLT 294 280 351   Coag's No results for input(s): APTT, INR in the last 168 hours. BMET  Recent Labs Lab  11/07/14 1230 11/07/14 1410 11/08/14 0554 11/09/14 0433  NA 126*  --  136 138  K 5.6*  --  5.3* 5.7*  CL 93*  --  104 106  CO2 23  --  24 26  BUN 71*  --  62* 48*  CREATININE 4.63* 4.56* 4.04* 3.09*  GLUCOSE 575*  --  304* 51*   Electrolytes  Recent Labs Lab 11/07/14 1230 11/08/14 0554 11/09/14 0433  CALCIUM 7.9* 8.1* 7.9*   Sepsis Markers  Recent Labs Lab 11/07/14 1248 11/07/14 1802  LATICACIDVEN 1.0 1.5   ABG  Recent Labs Lab 11/07/14 1340  PHART 7.39  PCO2ART 38  PO2ART 71*   Liver Enzymes  Recent Labs Lab 11/07/14 1230  AST 12*  ALT 10*  ALKPHOS 78  BILITOT 0.9  ALBUMIN 2.4*   Cardiac Enzymes  Recent Labs Lab 11/07/14 1230  TROPONINI <0.03   Glucose  Recent Labs Lab 11/08/14 1117 11/08/14 1626 11/08/14 2138 11/09/14 0635 11/09/14 0718 11/09/14 1150  GLUCAP 294* 367* 149* 62* 110* 185*    CXR: RUL consolidation, patchy infiltrates throughout  ASSESSMENT / PLAN:  PULMONARY A: Acute hypoxic resp failure due to CAP P:   Cont high flow O2 by [place] Add nebulized bronchodilators to facilitate mucociliary function  CARDIOVASCULAR A:  Hypertension P:  Cont current antihypertensive regimen  Goal SBP < 170 mmHg  RENAL A:  AKI, nonoliguric - improving CKD Hyperkalemia P:   Monitor BMET intermittently Monitor I/Os Correct electrolytes as indicated Avoid nephrotoxins Renal Service following - furosemide X 1 ordered 9/15  GASTROINTESTINAL A:   No issues P:   SUP: N/I presently Cont carb mod diet GI consult requested by primary team for unexplained anemia  HEMATOLOGIC A:   Anemia, unclear etiology. No overt bleeding presently P:  DVT px: SCDs Monitor CBC intermittently Transfuse per usual guidelines   INFECTIOUS A:   CAP, NOS P:   Monitor temp, WBC count Micro and abx as above  ENDOCRINE A:  DM2, improved control Episode of hypoglycemia 9/15 P:   Levemir dose adjusted Cont oral agents SSI adjusted Goal  CBG < 180  NEUROLOGIC A: No issues P:   RASS goal: 0 Monitor    Merton Border, MD PCCM service Mobile [phone] Pager [phone]   11/09/2014, 12:15 PM

## 2014-11-09 NOTE — Progress Notes (Signed)
Patients spO2 on HFNC on 100% at 50 Liters was 88%, placed patient on BIPAP for shortness of breath and low spO2

## 2014-11-09 NOTE — Plan of Care (Signed)
Problem: Phase I Progression Outcomes Goal: Voiding-avoid urinary catheter unless indicated Outcome: Not Progressing Pt experiencing some urinary retention and foley placed also for IV diuresis.

## 2014-11-09 NOTE — Progress Notes (Signed)
[facility] Progress Note Patient Name: Felicia Vincent DOB: [birth year] MRN: [id number]   Date of Service  11/09/2014  HPI/Events of Note  Worsening 02 sats and wob  eICU Interventions  Resume bipap/ ok to autotitrate pressures      Intervention Category Major Interventions: Respiratory failure - evaluation and management  Christinia Gully 11/09/2014, 8:56 PM

## 2014-11-09 NOTE — Progress Notes (Signed)
[facility] [place] NAME: Felicia Vincent    MR#:  [id number]  DATE OF BIRTH:  [birth year]  SUBJECTIVE:  CHIEF COMPLAINT:   Has on high flow o2, still sob, not able to break up cough  REVIEW OF SYSTEMS:  CONSTITUTIONAL: No fever, fatigue   but reportingweakness.  EYES: No blurred or double vision.  EARS, NOSE, AND THROAT: No tinnitus or ear pain.  RESPIRATORY: reporting persistent  cough and shortness of breath,  denieswheezing or hemoptysis.  CARDIOVASCULAR: No chest pain, orthopnea, edema.  GASTROINTESTINAL: No nausea, vomiting. Reporting diarrhea but no  abdominal pain.  GENITOURINARY: No dysuria, hematuria.  ENDOCRINE: No polyuria, nocturia,  HEMATOLOGY: No anemia, easy bruising or bleeding SKIN: No rash or lesion. MUSCULOSKELETAL: No joint pain or arthritis.   NEUROLOGIC: No tingling, numbness, weakness.  PSYCHIATRY: No anxiety or depression.   DRUG ALLERGIES:   Allergies  Allergen Reactions  . Codeine Nausea Only  . Prednisone Other (See Comments)    Pts daughter states that this medication "makes her evil".      VITALS:  Blood pressure 174/70, pulse 75, temperature 98.4 F (36.9 C), temperature source Oral, resp. rate 31, height 5\' 1"  (1.549 m), weight 88 kg (194 lb 0.1 oz), SpO2 87 %.  PHYSICAL EXAMINATION:  GENERAL:  69 y.o.-year-old patient lying in the bed   EYES: Pupils equal, round, reactive to light and accommodation. No scleral icterus. Extraocular muscles intact.  HEENT: Head atraumatic, normocephalic. Oropharynx and nasopharynx clear.  NECK:  Supple, no jugular venous distention. No thyroid enlargement, no tenderness.  LUNGS: coarse  breath sounds bilaterally, with ronchi CARDIOVASCULAR: S1, S2 normal. No murmurs, rubs, or gallops.  ABDOMEN: Soft, nontender, nondistended. Bowel sounds present. No organomegaly or mass.  EXTREMITIES: No pedal edema, cyanosis, or clubbing.  NEUROLOGIC: Cranial nerves II through  XII are intact. Muscle strength 5/5 in all extremities. Sensation intact. Gait not checked.  PSYCHIATRIC: The patient is alert and oriented x 3.  SKIN: No obvious rash, lesion, or ulcer.    LABORATORY PANEL:   CBC  Recent Labs Lab 11/09/14 0433  WBC 22.1*  HGB 8.2*  HCT 24.0*  PLT 351   ------------------------------------------------------------------------------------------------------------------  Chemistries   Recent Labs Lab 11/07/14 1230  11/09/14 0433  NA 126*  < > 138  K 5.6*  < > 5.7*  CL 93*  < > 106  CO2 23  < > 26  GLUCOSE 575*  < > 51*  BUN 71*  < > 48*  CREATININE 4.63*  < > 3.09*  CALCIUM 7.9*  < > 7.9*  AST 12*  --   --   ALT 10*  --   --   ALKPHOS 78  --   --   BILITOT 0.9  --   --   < > = values in this interval not displayed. ------------------------------------------------------------------------------------------------------------------  Cardiac Enzymes  Recent Labs Lab 11/07/14 1230  TROPONINI <0.03   ------------------------------------------------------------------------------------------------------------------  RADIOLOGY:  Dg Chest Port 1 View  11/09/2014   CLINICAL DATA:  Respiratory failure.  EXAM: PORTABLE CHEST - 1 VIEW  COMPARISON:  11/07/2014 .  FINDINGS: Mediastinum is stable. Stable cardiomegaly. Persistent multifocal pulmonary infiltrates particularly prominent in the right upper lobe again noted. Partial atelectasis of the right upper lobe noted. Pleural fluid in the right major fissure cannot be excluded. No pneumothorax. No acute osseus abnormality .  IMPRESSION: 1. Persistent multifocal bilateral pulmonary infiltrates, particular prominent in the right upper lobe.  Partial atelectasis of the right upper lobe present . Findings are most consistent with multifocal pneumonia. Small amount of pleural fluid in the right major fissure cannot be excluded.  2.  Stable cardiomegaly.   Electronically Signed   By: Marcello Moores  Register   On:  11/09/2014 08:05    EKG:   Orders placed or performed during the hospital encounter of 11/07/14  . EKG 12-Lead  . EKG 12-Lead    ASSESSMENT AND PLAN:    Patient is a 69 year old with bilateral pneumonia  1.  acute hypoxic respiratory failure secondary to bilateral pneumonia:  On high flow o2  continue with  Ceftriaxone, azithromycin Chest x-rays with persistent bilateral infiltrates      2. Acute renal failure on chronic renal failure due to dehydration: Baseline seems to be at 3.0  improved with IV fluids   avoid nephrotoxins Kayexalate dose 1  3. Hypertension continue amlodipine and hydralazine   4. Diabetes type 2 with hyperglycemia Place patient on insulin continue her Levemir if sugars continue stay elevated may need the insulin drip since she will be in the stepdown unit   5. History of CVA continue aspirin  6. Diarrhea, can be antibiotic associated Will provide probiotics C. difficile toxin is negative  6. Miscellaneous heparin for DVT prophylaxis   All the records are reviewed and case discussed with ED provider. Management plans discussed with the patient, family and they are in agreement.       CODE STATUS: Full code  TOTAL TIME TAKING CARE OF THIS PATIENT: 35 minutes.       Dustin Flock M.D on 11/09/2014 at 1:19 PM  Between 7am to 6pm - Pager - [phone] After 6pm go to [email or web address] - password [email or web address] [facility] [place]  Office  [phone]  CC: Primary care physician; Lavera Guise, MD

## 2014-11-09 NOTE — Progress Notes (Signed)
Lab revealed serum glucose at 51.  FSBS - 62.  Pt consumed 120 ml orange juice.

## 2014-11-09 NOTE — Progress Notes (Signed)
Subjective:   Patient is a 69 year old Caucasian female with diabetic kidney disease, hypertension, diabetic retinopathy, bilateral cataracts, history of TIA and chronic kidney disease. She is followed in the office for chronic kidney disease. Out patient, her renal function has been declining. Most recent labs are from sep 2016 when her GFR was 13, creatinine was 3.31 She now presents with pneumonia and is admitted to the ICU for further management Admission creatinine was 4.63. Today, serum creatinine has improved to 3.09. Patient reports she overall feels better  Objective:  Vital signs in last 24 hours:  Temp:  [98.3 F (36.8 C)-98.6 F (37 C)] 98.6 F (37 C) (09/15 0300) Pulse Rate:  [65-74] 65 (09/15 0500) Resp:  [15-33] 18 (09/15 0500) BP: (147-179)/(58-72) 147/59 mmHg (09/15 0500) SpO2:  [82 %-97 %] 96 % (09/15 0500) FiO2 (%):  [50 %-80 %] 80 % (09/15 0340)  Weight change:  Filed Weights   11/07/14 1207 11/07/14 1725  Weight: 86.864 kg (191 lb 8 oz) 88 kg (194 lb 0.1 oz)    Intake/Output:    Intake/Output Summary (Last 24 hours) at 11/09/14 0934 Last data filed at 11/09/14 0900  Gross per 24 hour  Intake   1960 ml  Output      0 ml  Net   1960 ml     Physical Exam: General:  frail, elderly, chronically ill-appearing, laying in the bed   HEENT  high flow nasal cannula, moist oral mucous membranes   Neck  supple   Pulm/lungs  bilateral basilar crackles, some rhonchi   CVS/Heart  irregular rhythm, no rub or gallop   Abdomen:   soft, nontender   Extremities:  trace peripheral edema   Neurologic:  alert, able to answer questions   Skin:  no acute rashes   Access:        Basic Metabolic Panel:   Recent Labs Lab 11/07/14 1230 11/07/14 1410 11/08/14 0554 11/09/14 0433  NA 126*  --  136 138  K 5.6*  --  5.3* 5.7*  CL 93*  --  104 106  CO2 23  --  24 26  GLUCOSE 575*  --  304* 51*  BUN 71*  --  62* 48*  CREATININE 4.63* 4.56* 4.04* 3.09*  CALCIUM  7.9*  --  8.1* 7.9*     CBC:  Recent Labs Lab 11/07/14 1230 11/07/14 1410 11/08/14 0554 11/09/14 0433  WBC 11.8* 11.2* 14.5* 22.1*  NEUTROABS 10.2*  --   --   --   HGB 7.2* 6.8* 8.5* 8.2*  HCT 21.2* 20.8* 23.9* 24.0*  MCV 88.8 87.1 93.5 92.2  PLT 242 294 280 351      Microbiology:  Recent Results (from the past 720 hour(s))  Culture, blood (routine x 2)     Status: None (Preliminary result)   Collection Time: 11/07/14 12:30 PM  Result Value Ref Range Status   Specimen Description BLOOD RIGHT ASSIST CONTROL  Final   Special Requests   Final    BOTTLES DRAWN AEROBIC AND ANAEROBIC  2CC AERO 1 CC ANAERO   Culture NO GROWTH < 24 HOURS  Final   Report Status PENDING  Incomplete  Culture, blood (routine x 2)     Status: None (Preliminary result)   Collection Time: 11/07/14 12:48 PM  Result Value Ref Range Status   Specimen Description BLOOD LEFT FATTY CASTS  Final   Special Requests BOTTLES DRAWN AEROBIC AND ANAEROBIC  1CC  Final   Culture NO GROWTH <  78 HOURS  Final   Report Status PENDING  Incomplete  MRSA PCR Screening     Status: None   Collection Time: 11/07/14  6:06 PM  Result Value Ref Range Status   MRSA by PCR NEGATIVE NEGATIVE Final    Comment:        The GeneXpert MRSA Assay (FDA approved for NASAL specimens only), is one component of a comprehensive MRSA colonization surveillance program. It is not intended to diagnose MRSA infection nor to guide or monitor treatment for MRSA infections.   C difficile quick scan w PCR reflex     Status: None   Collection Time: 11/08/14  4:15 AM  Result Value Ref Range Status   C Diff antigen NEGATIVE NEGATIVE Final   C Diff toxin NEGATIVE NEGATIVE Final   C Diff interpretation Negative for C. difficile  Final    Coagulation Studies: No results for input(s): LABPROT, INR in the last 72 hours.  Urinalysis: No results for input(s): COLORURINE, LABSPEC, PHURINE, GLUCOSEU, HGBUR, BILIRUBINUR, KETONESUR, PROTEINUR,  UROBILINOGEN, NITRITE, LEUKOCYTESUR in the last 72 hours.  Invalid input(s): APPERANCEUR    Imaging: Dg Chest Port 1 View  11/09/2014   CLINICAL DATA:  Respiratory failure.  EXAM: PORTABLE CHEST - 1 VIEW  COMPARISON:  11/07/2014 .  FINDINGS: Mediastinum is stable. Stable cardiomegaly. Persistent multifocal pulmonary infiltrates particularly prominent in the right upper lobe again noted. Partial atelectasis of the right upper lobe noted. Pleural fluid in the right major fissure cannot be excluded. No pneumothorax. No acute osseus abnormality .  IMPRESSION: 1. Persistent multifocal bilateral pulmonary infiltrates, particular prominent in the right upper lobe. Partial atelectasis of the right upper lobe present . Findings are most consistent with multifocal pneumonia. Small amount of pleural fluid in the right major fissure cannot be excluded.  2.  Stable cardiomegaly.   Electronically Signed   By: Marcello Moores  Register   On: 11/09/2014 08:05   Dg Chest Port 1 View  11/07/2014   CLINICAL DATA:  Dyspnea.  EXAM: PORTABLE CHEST - 1 VIEW  COMPARISON:  October 31, 2014  FINDINGS: The heart size and mediastinal contours are stable. The heart size is enlarged. There is diffuse consolidation throughout the right lung. Patchy consolidation of the left mid and lung base are identified. The visualized skeletal structures are unremarkable.  IMPRESSION: Pneumonias in bilateral lungs.   Electronically Signed   By: Abelardo Diesel M.D.   On: 11/07/2014 13:29     Medications:   . sodium chloride 100 mL/hr at 11/08/14 0133   . sodium chloride   Intravenous Once  . aspirin EC  81 mg Oral Daily  . atorvastatin  10 mg Oral QHS  . azithromycin  500 mg Intravenous Q24H  . cefTRIAXone (ROCEPHIN)  IV  1 g Intravenous Q24H  . citalopram  20 mg Oral QPM  . donepezil  5 mg Oral Daily  . Gerhardt's butt cream   Topical QID  . glimepiride  2 mg Oral BID  . guaiFENesin  600 mg Oral BID  . hydrALAZINE  50 mg Oral TID  .  insulin aspart  0-15 Units Subcutaneous TID WC  . insulin aspart  0-5 Units Subcutaneous QHS  . insulin aspart  3 Units Subcutaneous TID WC  . insulin detemir  30 Units Subcutaneous QPM  . isosorbide mononitrate  90 mg Oral BID  . Liraglutide  1.8 mg Subcutaneous Daily  . pneumococcal 23 valent vaccine  0.5 mL Intramuscular Tomorrow-1000  . psyllium  1  packet Oral Daily  . saccharomyces boulardii  250 mg Oral BID  . sotalol  120 mg Oral BID  . vancomycin  1,000 mg Intravenous Q48H  . vitamin B-12  1,000 mcg Oral Daily   acetaminophen **OR** acetaminophen, albuterol, benzonatate, guaiFENesin-dextromethorphan, hydrALAZINE, ondansetron **OR** ondansetron (ZOFRAN) IV  Assessment/ Plan:  69 y.o. female with diabetic kidney disease, hypertension, diabetic retinopathy, bilateral cataracts, history of TIA and chronic kidney disease stage V is admitted for pneumonia  1. Acute renal failure on chronic kidney disease stage V - Likely ATN due to concurrent illness Serum creatinine is improving slowly Electrolytes and Volume status are acceptable No acute indication for Dialysis at present   2.  Acute respiratory failure secondary to bilateral pneumonia - Currently high flow nasal cannula oxygen supplementation - Broad-spectrum antibiotics as per hospitalist team - Discontinue IV fluid supplementation - IV Lasix 1  3. Hypertension  - Avoid Ace inhibitor or ARB at this time due to advanced renal failure and acute renal failure - Sotalol, hydralazine, Imdur, furosemide  4. Hyperkalemia - Potassium level high at 5.7 - Low K diet   5. Diabetes type 2 with CKD - Difficult to achieve better blood sugar control likely due to underlying infection    LOS: 2 Felicia Vincent 9/15/20169:34 AM

## 2014-11-09 NOTE — Progress Notes (Signed)
ANTIBIOTIC CONSULT NOTE - INITIAL  Pharmacy Consult for vancomycin dosing/monitoring Indication: pneumonia  Allergies  Allergen Reactions  . Codeine Nausea Only  . Prednisone Other (See Comments)    Pts daughter states that this medication "makes her evil".      Patient Measurements: Height: 5\' 1"  (154.9 cm) Weight: 194 lb 0.1 oz (88 kg) IBW/kg (Calculated) : 47.8 Adjusted Body Weight: 63.9 kg  Vital Signs: Temp: 98.4 F (36.9 C) (09/15 0900) Temp Source: Oral (09/15 0900) BP: 174/70 mmHg (09/15 0900) Pulse Rate: 75 (09/15 0900) Intake/Output from previous day: 09/14 0701 - 09/15 0700 In: 1900 [I.V.:1900] Out: 253 [Urine:251; Stool:2] Intake/Output from this shift: Total I/O In: 360 [P.O.:360] Out: -   Labs:  Recent Labs  11/07/14 1410 11/08/14 0554 11/09/14 0433  WBC 11.2* 14.5* 22.1*  HGB 6.8* 8.5* 8.2*  PLT 294 280 351  CREATININE 4.56* 4.04* 3.09*   Estimated Creatinine Clearance: 16.8 mL/min (by C-G formula based on Cr of 3.09). No results for input(s): VANCOTROUGH, VANCOPEAK, VANCORANDOM, GENTTROUGH, GENTPEAK, GENTRANDOM, TOBRATROUGH, TOBRAPEAK, TOBRARND, AMIKACINPEAK, AMIKACINTROU, AMIKACIN in the last 72 hours.   Microbiology: Recent Results (from the past 720 hour(s))  Culture, blood (routine x 2)     Status: None (Preliminary result)   Collection Time: 11/07/14 12:30 PM  Result Value Ref Range Status   Specimen Description BLOOD RIGHT ASSIST CONTROL  Final   Special Requests   Final    BOTTLES DRAWN AEROBIC AND ANAEROBIC  2CC AERO 1 CC ANAERO   Culture NO GROWTH 2 DAYS  Final   Report Status PENDING  Incomplete  Culture, blood (routine x 2)     Status: None (Preliminary result)   Collection Time: 11/07/14 12:48 PM  Result Value Ref Range Status   Specimen Description BLOOD LEFT FATTY CASTS  Final   Special Requests BOTTLES DRAWN AEROBIC AND ANAEROBIC  1CC  Final   Culture NO GROWTH 2 DAYS  Final   Report Status PENDING  Incomplete  MRSA PCR  Screening     Status: None   Collection Time: 11/07/14  6:06 PM  Result Value Ref Range Status   MRSA by PCR NEGATIVE NEGATIVE Final    Comment:        The GeneXpert MRSA Assay (FDA approved for NASAL specimens only), is one component of a comprehensive MRSA colonization surveillance program. It is not intended to diagnose MRSA infection nor to guide or monitor treatment for MRSA infections.   C difficile quick scan w PCR reflex     Status: None   Collection Time: 11/08/14  4:15 AM  Result Value Ref Range Status   C Diff antigen NEGATIVE NEGATIVE Final   C Diff toxin NEGATIVE NEGATIVE Final   C Diff interpretation Negative for C. difficile  Final    Medical History: Past Medical History  Diagnosis Date  . Hypertension   . Diabetes mellitus without complication   . TIA (transient ischemic attack)   . Cataract   . Stroke   . Dizziness   . Anemia   . Pneumonia     Medications:  Anti-infectives    Start     Dose/Rate Route Frequency Ordered Stop   11/09/14 1500  vancomycin (VANCOCIN) IVPB 1000 mg/200 mL premix     1,000 mg 200 mL/hr over 60 Minutes Intravenous Every 48 hours 11/07/14 1615     11/08/14 1300  cefTRIAXone (ROCEPHIN) 1 g in dextrose 5 % 50 mL IVPB     1 g 100 mL/hr  over 30 Minutes Intravenous Every 24 hours 11/08/14 1117     11/08/14 1000  piperacillin-tazobactam (ZOSYN) IVPB 3.375 g  Status:  Discontinued     3.375 g 12.5 mL/hr over 240 Minutes Intravenous Every 12 hours 11/07/14 2322 11/08/14 1117   11/07/14 1430  piperacillin-tazobactam (ZOSYN) IVPB 3.375 g  Status:  Discontinued     3.375 g 12.5 mL/hr over 240 Minutes Intravenous 3 times per day 11/07/14 1428 11/07/14 2322   11/07/14 1430  azithromycin (ZITHROMAX) 500 mg in dextrose 5 % 250 mL IVPB     500 mg 250 mL/hr over 60 Minutes Intravenous Every 24 hours 11/07/14 1428     11/07/14 1415  piperacillin-tazobactam (ZOSYN) IVPB 3.375 g     3.375 g 12.5 mL/hr over 240 Minutes Intravenous  Once  11/07/14 1405 11/07/14 1537   11/07/14 1415  vancomycin (VANCOCIN) IVPB 1000 mg/200 mL premix     1,000 mg 200 mL/hr over 60 Minutes Intravenous  Once 11/07/14 1405 11/07/14 1607     Assessment: 69 y/o F with acute respiratory failure due to B/L PNA ordered empiric abx.   Goal of Therapy:  Vancomycin trough level 15-20 mcg/ml  Plan:  Will continue vancomycin 1000 mg iv q 48 hours and tentatively plan on checking a trough with the 3rd dose to r/o accumulation. May have to change dose as renal function moves back towards baseline. Will f/u culture results.  Ulice Dash D 11/09/2014,12:50 PM

## 2014-11-10 ENCOUNTER — Inpatient Hospital Stay: Payer: Medicare Other

## 2014-11-10 ENCOUNTER — Inpatient Hospital Stay
Admit: 2014-11-10 | Discharge: 2014-11-10 | Disposition: A | Discharge status: Home / Self Care | Payer: Medicare Other | Attending: Pulmonary Disease | Admitting: Pulmonary Disease

## 2014-11-10 DIAGNOSIS — J810 Acute pulmonary edema: Secondary | ICD-10-CM

## 2014-11-10 LAB — GLUCOSE, CAPILLARY
GLUCOSE-CAPILLARY: 110 mg/dL — AB (ref 65–99)
GLUCOSE-CAPILLARY: 155 mg/dL — AB (ref 65–99)
GLUCOSE-CAPILLARY: 256 mg/dL — AB (ref 65–99)
Glucose-Capillary: 342 mg/dL — ABNORMAL HIGH (ref 65–99)

## 2014-11-10 LAB — BASIC METABOLIC PANEL
ANION GAP: 7 (ref 5–15)
ANION GAP: 11 (ref 5–15)
Anion gap: 7 (ref 5–15)
BUN: 40 mg/dL — AB (ref 6–20)
BUN: 41 mg/dL — ABNORMAL HIGH (ref 6–20)
BUN: 45 mg/dL — ABNORMAL HIGH (ref 6–20)
CALCIUM: 8.3 mg/dL — AB (ref 8.9–10.3)
CALCIUM: 8.5 mg/dL — AB (ref 8.9–10.3)
CHLORIDE: 105 mmol/L (ref 101–111)
CO2: 26 mmol/L (ref 22–32)
CO2: 25 mmol/L (ref 22–32)
CO2: 21 mmol/L — AB (ref 22–32)
CREATININE: 2.76 mg/dL — AB (ref 0.44–1.00)
Calcium: 8.1 mg/dL — ABNORMAL LOW (ref 8.9–10.3)
Chloride: 106 mmol/L (ref 101–111)
Chloride: 105 mmol/L (ref 101–111)
Creatinine, Ser: 2.75 mg/dL — ABNORMAL HIGH (ref 0.44–1.00)
Creatinine, Ser: 2.79 mg/dL — ABNORMAL HIGH (ref 0.44–1.00)
GFR, EST AFRICAN AMERICAN: 19 mL/min — AB (ref >60)
GFR, EST AFRICAN AMERICAN: 19 mL/min — AB (ref >60)
GFR, EST AFRICAN AMERICAN: 19 mL/min — AB (ref >60)
GFR, EST NON AFRICAN AMERICAN: 16 mL/min — AB (ref >60)
GFR, EST NON AFRICAN AMERICAN: 16 mL/min — AB (ref >60)
GFR, EST NON AFRICAN AMERICAN: 16 mL/min — AB (ref >60)
GLUCOSE: 205 mg/dL — AB (ref 65–99)
GLUCOSE: 336 mg/dL — AB (ref 65–99)
Glucose, Bld: 139 mg/dL — ABNORMAL HIGH (ref 65–99)
POTASSIUM: 2.9 mmol/L — AB (ref 3.5–5.1)
POTASSIUM: 4.2 mmol/L (ref 3.5–5.1)
POTASSIUM: 4.7 mmol/L (ref 3.5–5.1)
SODIUM: 138 mmol/L (ref 135–145)
Sodium: 138 mmol/L (ref 135–145)
Sodium: 137 mmol/L (ref 135–145)

## 2014-11-10 LAB — BLOOD GAS, ARTERIAL
ACID-BASE DEFICIT: 4.1 mmol/L — AB (ref 0.0–2.0)
BICARBONATE: 24.3 meq/L (ref 21.0–28.0)
FIO2: 0.85
LHR: 16 {breaths}/min
MECHANICAL RATE: 16
MECHVT: 450 mL
O2 Saturation: 92.4 %
PATIENT TEMPERATURE: 37
PCO2 ART: 58 mmHg — AB (ref 32.0–48.0)
PEEP/CPAP: 5 cmH2O
PO2 ART: 77 mmHg — AB (ref 83.0–108.0)
pH, Arterial: 7.23 — ABNORMAL LOW (ref 7.350–7.450)

## 2014-11-10 LAB — CBC
HCT: 23.8 % — ABNORMAL LOW (ref 35.0–47.0)
Hemoglobin: 8.5 g/dL — ABNORMAL LOW (ref 12.0–16.0)
MCH: 35.3 pg — ABNORMAL HIGH (ref 26.0–34.0)
MCHC: 35.9 g/dL (ref 32.0–36.0)
MCV: 98.3 fL (ref 80.0–100.0)
PLATELETS: 372 10*3/uL (ref 150–440)
RBC: 2.42 MIL/uL — AB (ref 3.80–5.20)
RDW: 16.4 % — AB (ref 11.5–14.5)
WBC: 27.5 10*3/uL — AB (ref 3.6–11.0)

## 2014-11-10 LAB — BRAIN NATRIURETIC PEPTIDE: B Natriuretic Peptide: 436 pg/mL — ABNORMAL HIGH (ref 0.0–100.0)

## 2014-11-10 LAB — MAGNESIUM: Magnesium: 1.6 mg/dL — ABNORMAL LOW (ref 1.7–2.4)

## 2014-11-10 MED ORDER — SODIUM CHLORIDE 0.9 % IJ SOLN
10.0000 mL | INTRAMUSCULAR | Status: DC | PRN
Start: 2014-11-10 — End: 2014-11-17

## 2014-11-10 MED ORDER — LORAZEPAM 2 MG/ML IJ SOLN: 0.5000 mg | INTRAMUSCULAR | Status: DC | PRN

## 2014-11-10 MED ORDER — POTASSIUM CHLORIDE CRYS ER 20 MEQ PO TBCR
40.0000 meq | EXTENDED_RELEASE_TABLET | Freq: Once | ORAL | Status: AC
  Administered 2014-11-10: 40 meq via ORAL
  Filled 2014-11-10: qty 2

## 2014-11-10 MED ORDER — FUROSEMIDE 10 MG/ML IJ SOLN
4.0000 mg/h | INTRAVENOUS | Status: DC
  Administered 2014-11-10: 10 mg/h via INTRAVENOUS
  Administered 2014-11-10: 8 mg/h via INTRAVENOUS
  Administered 2014-11-12: 4 mg/h via INTRAVENOUS
  Filled 2014-11-10 (×3): qty 25

## 2014-11-10 MED ORDER — METHYLPREDNISOLONE SODIUM SUCC 40 MG IJ SOLR
20.0000 mg | Freq: Two times a day (BID) | INTRAMUSCULAR | Status: DC
  Administered 2014-11-10 – 2014-11-13 (×6): 20 mg via INTRAVENOUS
  Filled 2014-11-10 (×6): qty 1

## 2014-11-10 MED ORDER — INSULIN DETEMIR 100 UNIT/ML ~~LOC~~ SOLN
23.0000 [IU] | Freq: Every evening | SUBCUTANEOUS | Status: DC
Start: 2014-11-10 — End: 2014-11-10
  Filled 2014-11-10: qty 0.23

## 2014-11-10 MED ORDER — CHLORHEXIDINE GLUCONATE 0.12% ORAL RINSE (MEDLINE KIT)
15.0000 mL | Freq: Two times a day (BID) | OROMUCOSAL | Status: DC
  Administered 2014-11-10 – 2014-11-17 (×11): 15 mL via OROMUCOSAL
  Filled 2014-11-10 (×16): qty 15

## 2014-11-10 MED ORDER — MIDAZOLAM HCL 2 MG/2ML IJ SOLN
2.0000 mg | Freq: Once | INTRAMUSCULAR | Status: AC
  Administered 2014-11-10: 2 mg via INTRAVENOUS

## 2014-11-10 MED ORDER — POTASSIUM CHLORIDE 10 MEQ/50ML IV SOLN: 10.0000 meq | INTRAVENOUS | Status: DC

## 2014-11-10 MED ORDER — SODIUM CHLORIDE 0.9 % IV SOLN
INTRAVENOUS | Status: DC
  Filled 2014-11-10 (×4): qty 50

## 2014-11-10 MED ORDER — SODIUM CHLORIDE 0.9 % IV SOLN
INTRAVENOUS | Status: DC
  Administered 2014-11-11: 3.2 [IU]/h via INTRAVENOUS
  Administered 2014-11-12: 12:00:00 via INTRAVENOUS
  Administered 2014-11-13: 9.2 [IU]/h via INTRAVENOUS
  Filled 2014-11-10 (×3): qty 2.5

## 2014-11-10 MED ORDER — MAGNESIUM SULFATE 2 GM/50ML IV SOLN
2.0000 g | Freq: Once | INTRAVENOUS | Status: AC
  Administered 2014-11-10: 2 g via INTRAVENOUS
  Filled 2014-11-10: qty 50

## 2014-11-10 MED ORDER — MIDAZOLAM HCL 2 MG/2ML IJ SOLN
2.0000 mg | INTRAMUSCULAR | Status: DC | PRN
  Administered 2014-11-11: 2 mg via INTRAVENOUS
  Filled 2014-11-10: qty 2

## 2014-11-10 MED ORDER — FAMOTIDINE IN NACL 20-0.9 MG/50ML-% IV SOLN
20.0000 mg | INTRAVENOUS | Status: DC
  Administered 2014-11-10 – 2014-11-11 (×2): 20 mg via INTRAVENOUS
  Filled 2014-11-10 (×3): qty 50

## 2014-11-10 MED ORDER — HYDRALAZINE HCL 20 MG/ML IJ SOLN
10.0000 mg | INTRAMUSCULAR | Status: DC | PRN
  Administered 2014-11-10 (×2): 20 mg via INTRAVENOUS
  Administered 2014-11-13 – 2014-11-14 (×2): 10 mg via INTRAVENOUS
  Filled 2014-11-10 (×2): qty 2
  Filled 2014-11-10: qty 1

## 2014-11-10 MED ORDER — FENTANYL 2500MCG IN NS 250ML (10MCG/ML) PREMIX INFUSION
10.0000 ug/h | INTRAVENOUS | Status: DC
  Administered 2014-11-10: 10 ug/h via INTRAVENOUS
  Administered 2014-11-11: 50 ug/h via INTRAVENOUS
  Administered 2014-11-12: 25 ug/h via INTRAVENOUS
  Filled 2014-11-10 (×2): qty 250

## 2014-11-10 MED ORDER — ANTISEPTIC ORAL RINSE SOLUTION (CORINZ)
7.0000 mL | Freq: Four times a day (QID) | OROMUCOSAL | Status: DC
  Administered 2014-11-11 – 2014-11-13 (×12): 7 mL via OROMUCOSAL
  Filled 2014-11-10 (×14): qty 7

## 2014-11-10 MED ORDER — FENTANYL CITRATE (PF) 100 MCG/2ML IJ SOLN
INTRAMUSCULAR | Status: AC
  Administered 2014-11-10: 100 ug via INTRAVENOUS
  Filled 2014-11-10: qty 2

## 2014-11-10 MED ORDER — POTASSIUM CHLORIDE 10 MEQ/100ML IV SOLN
10.0000 meq | INTRAVENOUS | Status: DC
  Administered 2014-11-10 (×2): 10 meq via INTRAVENOUS
  Filled 2014-11-10 (×2): qty 100

## 2014-11-10 MED ORDER — MIDAZOLAM HCL 2 MG/2ML IJ SOLN
INTRAMUSCULAR | Status: AC
  Administered 2014-11-10: 2 mg via INTRAVENOUS
  Filled 2014-11-10: qty 4

## 2014-11-10 MED ORDER — VECURONIUM BROMIDE 10 MG IV SOLR
10.0000 mg | Freq: Once | INTRAVENOUS | Status: AC
  Administered 2014-11-10: 10 mg via INTRAVENOUS

## 2014-11-10 MED ORDER — HEPARIN SODIUM (PORCINE) 5000 UNIT/ML IJ SOLN
5000.0000 [IU] | Freq: Three times a day (TID) | INTRAMUSCULAR | Status: DC
  Administered 2014-11-10 – 2014-11-11 (×2): 5000 [IU] via SUBCUTANEOUS
  Filled 2014-11-10 (×2): qty 1

## 2014-11-10 MED ORDER — LORAZEPAM 2 MG/ML IJ SOLN
0.2500 mg | INTRAMUSCULAR | Status: DC | PRN
  Administered 2014-11-10: 0.25 mg via INTRAVENOUS
  Filled 2014-11-10: qty 1

## 2014-11-10 MED ORDER — VECURONIUM BROMIDE 10 MG IV SOLR
INTRAVENOUS | Status: AC
  Administered 2014-11-10: 10 mg via INTRAVENOUS
  Filled 2014-11-10: qty 10

## 2014-11-10 MED ORDER — FENTANYL CITRATE (PF) 100 MCG/2ML IJ SOLN
100.0000 ug | Freq: Once | INTRAMUSCULAR | Status: AC
  Administered 2014-11-10: 100 ug via INTRAVENOUS

## 2014-11-10 MED ORDER — SODIUM CHLORIDE 0.9 % IJ SOLN
10.0000 mL | Freq: Two times a day (BID) | INTRAMUSCULAR | Status: DC
  Administered 2014-11-11 – 2014-11-14 (×5): 10 mL
  Administered 2014-11-14: 30 mL
  Administered 2014-11-15: 10 mL
  Administered 2014-11-16 – 2014-11-17 (×3): 30 mL

## 2014-11-10 MED ORDER — HYDRALAZINE HCL 20 MG/ML IJ SOLN
INTRAMUSCULAR | Status: AC
  Administered 2014-11-10: 20 mg via INTRAVENOUS
  Filled 2014-11-10: qty 1

## 2014-11-10 NOTE — Procedures (Signed)
Endotracheal Intubation: Patient required placement of an artificial airway secondary to resp failure.   Consent: Emergent.   Hand washing performed prior to starting the procedure.   Medications administered for sedation prior to procedure: Midazolam 2 mg IV,  Vecuronium 10 mg IV, Fentanyl 100 mcg IV.   Procedure: A time out procedure was called and correct patient, name, & ID confirmed. Needed supplies and equipment were assembled and checked to include ETT, 10 ml syringe, Glidescope, Mac and Miller blades, suction, oxygen and bag mask valve, end tidal CO2 monitor. Patient was positioned to align the mouth and pharynx to facilitate visualization of the glottis.  Heart rate, SpO2 and blood pressure was continuously monitored during the procedure. Pre-oxygenation was conducted prior to intubation and endotracheal tube was placed through the vocal cords into the trachea.  During intubation an assistant applied gentle pressure to the cricoid cartilage.   The artificial airway was placed under direct visualization via glidescope route using a 8.0 ETT on the first attempt.    ETT was secured at 24 cm mark.    Placement was confirmed by auscuitation of lungs with good breath sounds bilaterally and no stomach sounds.  Condensation was noted on endotracheal tube.  Pulse ox 95%.  CO2 detector in place with appropriate color change.   Complications: None .   Operator: Kasa.   Chest radiograph ordered and pending.   Comments: OGT placed via glidescope.  Corrin Parker, M.D.  [facility]  Medical Director [place] Director [facility] Cardio-Pulmonary Department

## 2014-11-10 NOTE — Progress Notes (Signed)
*  PRELIMINARY RESULTS* Echocardiogram 2D Echocardiogram has been performed.  Felicia Vincent 11/10/2014, 7:02 PM

## 2014-11-10 NOTE — Procedures (Signed)
Central Venous Catheter Placement: Indication: Patient receiving vesicant or irritant drug.; Patient receiving intravenous therapy for longer than 5 days.; Patient has limited or no vascular access.   Consent:emergent/verbal  Risks and benefits explained in detail including risk of infection, bleeding, respiratory failure and death..   Hand washing performed prior to starting the procedure.   Procedure: An active timeout was performed and correct patient, name, & ID confirmed.  After explaining risk and benefits, patient was positioned correctly for central venous access. Patient was prepped using strict sterile technique including chlorohexadine preps, sterile drape, sterile gown and sterile gloves.  The area was prepped, draped and anesthetized in the usual sterile manner. Patient comfort was obtained.  A triple lumen catheter was placed in RT  Internal Jugular Vein There was good blood return, catheter caps were placed on lumens, catheter flushed easily, the line was secured and a sterile dressing and BIO-PATCH applied.   Ultrasound was used to visualize vasculature and guidance of needle.   Number of Attempts: 1 Complications:none Estimated Blood Loss: none Chest Radiograph indicated and ordered.  Procedure time:10 mins Operator: Kasa.   Felicia Vincent, M.D.  [facility]  Medical Director [place] Director [facility] Cardio-Pulmonary Department

## 2014-11-10 NOTE — Consult Note (Signed)
I was making rounds in ICU, patient placed on biPAP for increased WOB, CXR with progressive b/l infiltrates, started on lasix infusion, family at bedside. Daughter updated, CXR reviewed with daughter. She is asking for lasix infusion to be increased will discuss with  Nephrology  Daughter allowed low dose ativan to assist patient with biPAP.   Patient at very high risk for intubation, daughter does not want to intubate at this time to prevent cardiac arrest. However, patient is full code and will place on vent as last resort as per daughter.

## 2014-11-10 NOTE — Care Management Important Message (Signed)
Important Message  Patient Details  Name: Felicia Vincent MRN: [id number] Date of Birth: 22-Apr-1945   Medicare Important Message Given:  Yes-second notification given    Juliann Pulse A Allmond 11/10/2014, 1:40 PM

## 2014-11-10 NOTE — Progress Notes (Signed)
Subjective:   Patient is a 69 year old Caucasian female with diabetic kidney disease, hypertension, diabetic retinopathy, bilateral cataracts, history of TIA and chronic kidney disease. She is followed in the office for chronic kidney disease. Out patient, her renal function has been declining. Most recent labs are from sep 2016 when her GFR was 13, creatinine was 3.31 She now presents with pneumonia and is admitted to the ICU for further management Admission creatinine was 4.63. Today, serum creatinine has improved to 2.76. Patient is back on BiPAP She reports difficulty breathing Urine output 1450 cc over the last 24 hours  Objective:  Vital signs in last 24 hours:  Temp:  [96.2 F (35.7 C)-98.4 F (36.9 C)] 96.2 F (35.7 C) (09/16 0845) Pulse Rate:  [74-87] 87 (09/16 0800) Resp:  [26-34] 33 (09/16 0800) BP: (161-186)/(60-84) 186/73 mmHg (09/16 0800) SpO2:  [86 %-97 %] 91 % (09/16 0800) FiO2 (%):  [60 %] 60 % (09/15 2000)  Weight change:  Filed Weights   11/07/14 1207 11/07/14 1725  Weight: 86.864 kg (191 lb 8 oz) 88 kg (194 lb 0.1 oz)    Intake/Output:    Intake/Output Summary (Last 24 hours) at 11/10/14 0935 Last data filed at 11/09/14 1800  Gross per 24 hour  Intake    950 ml  Output   1800 ml  Net   -850 ml     Physical Exam: General:  frail, elderly, chronically ill-appearing, laying in the bed   HEENT  BiPAP mask in place   Neck  supple   Pulm/lungs  bilateral basilar crackles, some rhonchi , NIPPV  CVS/Heart  irregular rhythm, no rub or gallop   Abdomen:   soft, nontender   Extremities:  ++ peripheral edema   Neurologic:  alert, able to answer questions   Skin:  no acute rashes   Access:     Foley in place     Basic Metabolic Panel:   Recent Labs Lab 11/07/14 1230 11/07/14 1410 11/08/14 0554 11/09/14 0433 11/10/14 0511  NA 126*  --  136 138 138  K 5.6*  --  5.3* 5.7* 2.9*  CL 93*  --  104 106 105  CO2 23  --  24 26 26   GLUCOSE 575*  --   304* 51* 139*  BUN 71*  --  62* 48* 40*  CREATININE 4.63* 4.56* 4.04* 3.09* 2.76*  CALCIUM 7.9*  --  8.1* 7.9* 8.1*     CBC:  Recent Labs Lab 11/07/14 1230 11/07/14 1410 11/08/14 0554 11/09/14 0433 11/10/14 0511  WBC 11.8* 11.2* 14.5* 22.1* 27.5*  NEUTROABS 10.2*  --   --   --   --   HGB 7.2* 6.8* 8.5* 8.2* 8.5*  HCT 21.2* 20.8* 23.9* 24.0* 23.8*  MCV 88.8 87.1 93.5 92.2 98.3  PLT 242 294 280 351 372      Microbiology:  Recent Results (from the past 720 hour(s))  Culture, blood (routine x 2)     Status: None (Preliminary result)   Collection Time: 11/07/14 12:30 PM  Result Value Ref Range Status   Specimen Description BLOOD RIGHT ASSIST CONTROL  Final   Special Requests   Final    BOTTLES DRAWN AEROBIC AND ANAEROBIC  2CC AERO 1 CC ANAERO   Culture NO GROWTH 3 DAYS  Final   Report Status PENDING  Incomplete  Culture, blood (routine x 2)     Status: None (Preliminary result)   Collection Time: 11/07/14 12:48 PM  Result Value Ref Range Status  Specimen Description BLOOD LEFT FATTY CASTS  Final   Special Requests BOTTLES DRAWN AEROBIC AND ANAEROBIC  1CC  Final   Culture NO GROWTH 3 DAYS  Final   Report Status PENDING  Incomplete  MRSA PCR Screening     Status: None   Collection Time: 11/07/14  6:06 PM  Result Value Ref Range Status   MRSA by PCR NEGATIVE NEGATIVE Final    Comment:        The GeneXpert MRSA Assay (FDA approved for NASAL specimens only), is one component of a comprehensive MRSA colonization surveillance program. It is not intended to diagnose MRSA infection nor to guide or monitor treatment for MRSA infections.   C difficile quick scan w PCR reflex     Status: None   Collection Time: 11/08/14  4:15 AM  Result Value Ref Range Status   C Diff antigen NEGATIVE NEGATIVE Final   C Diff toxin NEGATIVE NEGATIVE Final   C Diff interpretation Negative for C. difficile  Final    Coagulation Studies: No results for input(s): LABPROT, INR in the  last 72 hours.  Urinalysis: No results for input(s): COLORURINE, LABSPEC, PHURINE, GLUCOSEU, HGBUR, BILIRUBINUR, KETONESUR, PROTEINUR, UROBILINOGEN, NITRITE, LEUKOCYTESUR in the last 72 hours.  Invalid input(s): APPERANCEUR    Imaging: Dg Chest Port 1 View  11/10/2014   CLINICAL DATA:  Evaluate pneumonia.  EXAM: PORTABLE CHEST - 1 VIEW  COMPARISON:  11/09/2014  FINDINGS: The normal heart size. Aortic atherosclerosis identified. Persistent bilateral pulmonary opacities identified. There is slightly improved aeration to the right upper lobe with worsening aeration to the right lower lobe.  IMPRESSION: 1. Persistent bilateral airspace opacities with slightly improved aeration to the right upper lobe.   Electronically Signed   By: Kerby Moors M.D.   On: 11/10/2014 09:31   Dg Chest Port 1 View  11/09/2014   CLINICAL DATA:  Respiratory failure.  EXAM: PORTABLE CHEST - 1 VIEW  COMPARISON:  11/07/2014 .  FINDINGS: Mediastinum is stable. Stable cardiomegaly. Persistent multifocal pulmonary infiltrates particularly prominent in the right upper lobe again noted. Partial atelectasis of the right upper lobe noted. Pleural fluid in the right major fissure cannot be excluded. No pneumothorax. No acute osseus abnormality .  IMPRESSION: 1. Persistent multifocal bilateral pulmonary infiltrates, particular prominent in the right upper lobe. Partial atelectasis of the right upper lobe present . Findings are most consistent with multifocal pneumonia. Small amount of pleural fluid in the right major fissure cannot be excluded.  2.  Stable cardiomegaly.   Electronically Signed   By: Marcello Moores  Register   On: 11/09/2014 08:05     Medications:   . furosemide (LASIX) infusion     . sodium chloride   Intravenous Once  . acetylcysteine  4 mL Nebulization BID  . albuterol  2.5 mg Nebulization Q6H  . aspirin EC  81 mg Oral Daily  . atorvastatin  10 mg Oral QHS  . azithromycin  500 mg Intravenous Q24H  . cefTRIAXone  (ROCEPHIN)  IV  1 g Intravenous Q24H  . citalopram  20 mg Oral QPM  . donepezil  5 mg Oral Daily  . Gerhardt's butt cream   Topical QID  . guaiFENesin  600 mg Oral BID  . hydrALAZINE  50 mg Oral TID  . insulin aspart  0-5 Units Subcutaneous QHS  . insulin aspart  0-9 Units Subcutaneous TID WC  . insulin aspart  3 Units Subcutaneous TID WC  . insulin detemir  20 Units Subcutaneous QPM  .  isosorbide mononitrate  90 mg Oral BID  . Liraglutide  1.8 mg Subcutaneous Daily  . pneumococcal 23 valent vaccine  0.5 mL Intramuscular Tomorrow-1000  . psyllium  1 packet Oral Daily  . saccharomyces boulardii  250 mg Oral BID  . sotalol  120 mg Oral BID  . vancomycin  1,000 mg Intravenous Q48H  . vitamin B-12  1,000 mcg Oral Daily   acetaminophen **OR** acetaminophen, benzonatate, guaiFENesin-dextromethorphan, hydrALAZINE, ondansetron **OR** ondansetron (ZOFRAN) IV  Assessment/ Plan:  69 y.o. female with diabetic kidney disease, hypertension, diabetic retinopathy, bilateral cataracts, history of TIA and chronic kidney disease stage V is admitted for pneumonia  1. Acute renal failure on chronic kidney disease stage V - Likely ATN due to concurrent illness Serum creatinine is improving slowly Electrolytes and Volume status are acceptable No acute indication for Dialysis at present   2.  Acute respiratory failure secondary to bilateral pneumonia - Currently high flow nasal cannula oxygen supplementation - Broad-spectrum antibiotics as per hospitalist team - At present seems like pulmonary edema is contributing to shortness of breath - Agree with NIPPV - IV Lasix infusion  3. Hypertension  - Avoid Ace inhibitor or ARB at this time due to advanced renal failure and acute renal failure - Sotalol, hydralazine, Imdur, furosemide  4. Hyperkalemia, now hypokalemia - We will order potassium supplementation and repeat check this afternoon   5. Diabetes type 2 with CKD - Difficult to achieve better  blood sugar control likely due to underlying infection    LOS: 3 SINGH,HARMEET 9/16/20169:35 AM

## 2014-11-10 NOTE — Progress Notes (Signed)
[place] Progress Note Patient Name: Felicia Vincent DOB: [birth year] MRN: [id number]   Date of Service  11/10/2014  HPI/Events of Note  Resp acidosis on ABG with pH 7.23/58/77/24.3  eICU Interventions  Plan: Increase RR to 25 Increase TV to 500 cc ABG in 2 hours post vent change     Intervention Category Major Interventions: Acid-Base disturbance - evaluation and management  DETERDING,ELIZABETH 11/10/2014, 11:10 PM

## 2014-11-10 NOTE — Progress Notes (Signed)
[facility] [place] NAME: Felicia Vincent    MR#:  [id number]  DATE OF BIRTH:  02-11-1946  SUBJECTIVE:  CHIEF COMPLAINT:    Has to be placed on BiPAP overnight due to tachypnea and increased work of breathing Continues to be short of breath daughter requesting if patient can be placed on the high flow nasal cannula  REVIEW OF SYSTEMS:  CONSTITUTIONAL: No fever, fatigue   but reportingweakness.  EYES: No blurred or double vision.  EARS, NOSE, AND THROAT: No tinnitus or ear pain.  RESPIRATORY: reporting persistent  cough and shortness of breath,  denieswheezing or hemoptysis.  CARDIOVASCULAR: No chest pain, orthopnea, edema.  GASTROINTESTINAL: No nausea, vomiting. Reporting diarrhea but no  abdominal pain.  GENITOURINARY: No dysuria, hematuria.  ENDOCRINE: No polyuria, nocturia,  HEMATOLOGY: No anemia, easy bruising or bleeding SKIN: No rash or lesion. MUSCULOSKELETAL: No joint pain or arthritis.   NEUROLOGIC: No tingling, numbness, weakness.  PSYCHIATRY: No anxiety or depression.   DRUG ALLERGIES:   Allergies  Allergen Reactions  . Codeine Nausea Only  . Prednisone Other (See Comments)    Pts daughter states that this medication "makes her evil".      VITALS:  Blood pressure 175/59, pulse 83, temperature 98.5 F (36.9 C), temperature source Axillary, resp. rate 33, height 5\' 1"  (1.549 m), weight 88 kg (194 lb 0.1 oz), SpO2 95 %.  PHYSICAL EXAMINATION:  GENERAL:  69 y.o.-year-old patient lying in the bed  critically ill-appearing EYES: Pupils equal, round, reactive to light and accommodation. No scleral icterus. Extraocular muscles intact.  HEENT: Head atraumatic, normocephalic. Oropharynx and nasopharynx clear.  NECK:  Supple, no jugular venous distention. No thyroid enlargement, no tenderness.  LUNGS: coarse  breath sounds bilaterally, with ronchi CARDIOVASCULAR: S1, S2 normal. No murmurs, rubs, or gallops.  ABDOMEN: Soft,  nontender, nondistended. Bowel sounds present. No organomegaly or mass.  EXTREMITIES: No pedal edema, cyanosis, or clubbing.  NEUROLOGIC: Cranial nerves II through XII are intact. Muscle strength 5/5 in all extremities. Sensation intact. Gait not checked.  PSYCHIATRIC: The patient is alert and oriented x 3.  SKIN: No obvious rash, lesion, or ulcer.    LABORATORY PANEL:   CBC  Recent Labs Lab 11/10/14 0511  WBC 27.5*  HGB 8.5*  HCT 23.8*  PLT 372   ------------------------------------------------------------------------------------------------------------------  Chemistries   Recent Labs Lab 11/07/14 1230  11/10/14 1302  NA 126*  < > 138  K 5.6*  < > 4.2  CL 93*  < > 106  CO2 23  < > 25  GLUCOSE 575*  < > 205*  BUN 71*  < > 41*  CREATININE 4.63*  < > 2.75*  CALCIUM 7.9*  < > 8.3*  MG  --   --  1.6*  AST 12*  --   --   ALT 10*  --   --   ALKPHOS 78  --   --   BILITOT 0.9  --   --   < > = values in this interval not displayed. ------------------------------------------------------------------------------------------------------------------  Cardiac Enzymes  Recent Labs Lab 11/07/14 1230  TROPONINI <0.03   ------------------------------------------------------------------------------------------------------------------  RADIOLOGY:  Dg Chest Port 1 View  11/10/2014   CLINICAL DATA:  Evaluate pneumonia.  EXAM: PORTABLE CHEST - 1 VIEW  COMPARISON:  11/09/2014  FINDINGS: The normal heart size. Aortic atherosclerosis identified. Persistent bilateral pulmonary opacities identified. There is slightly improved aeration to the right upper lobe with worsening aeration to the  right lower lobe.  IMPRESSION: 1. Persistent bilateral airspace opacities with slightly improved aeration to the right upper lobe.   Electronically Signed   By: Kerby Moors M.D.   On: 11/10/2014 09:31   Dg Chest Port 1 View  11/09/2014   CLINICAL DATA:  Respiratory failure.  EXAM: PORTABLE CHEST - 1  VIEW  COMPARISON:  11/07/2014 .  FINDINGS: Mediastinum is stable. Stable cardiomegaly. Persistent multifocal pulmonary infiltrates particularly prominent in the right upper lobe again noted. Partial atelectasis of the right upper lobe noted. Pleural fluid in the right major fissure cannot be excluded. No pneumothorax. No acute osseus abnormality .  IMPRESSION: 1. Persistent multifocal bilateral pulmonary infiltrates, particular prominent in the right upper lobe. Partial atelectasis of the right upper lobe present . Findings are most consistent with multifocal pneumonia. Small amount of pleural fluid in the right major fissure cannot be excluded.  2.  Stable cardiomegaly.   Electronically Signed   By: Marcello Moores  Register   On: 11/09/2014 08:05    EKG:   Orders placed or performed during the hospital encounter of 11/07/14  . EKG 12-Lead  . EKG 12-Lead  . ED EKG    ASSESSMENT AND PLAN:    Patient is a 69 year old with bilateral pneumonia  1.  acute hypoxic respiratory failure secondary to bilateral pneumonia:  continue with  Ceftriaxone, azithromycin Chest x-rays with persistent bilateral infiltrates now concern for CHF Started on IV Lasix therapy Intensivist following the patient, at high risk of intubation    2. Acute renal failure on chronic renal failure due to dehydration: Baseline seems to be at 3.0  improved with IV fluids   avoid nephrotoxins Received Kayexalate for hyperkalemia  3. Hypertension continue amlodipine and hydralazine   4. Diabetes type 2 with hyperglycemia blood glucose started increasing again so we'll go ahead and increase her Levemir to 23 units  5. History of CVA continue aspirin  6. Diarrhea, can be antibiotic associated Will provide probiotics  C. difficile toxin is negative  6. Miscellaneous heparin for DVT prophylaxis   All the records are reviewed and case discussed with ED provider. Management plans discussed with the patient, family and they are in  agreement.       CODE STATUS: Full code  TOTAL TIME TAKING CARE OF THIS PATIENT: 35 minutes. Critical care time     Dustin Flock M.D on 11/10/2014 at 1:38 PM  Between 7am to 6pm - Pager - [phone] After 6pm go to [email or web address] - password [email or web address] [facility]  Office  [phone]  CC: Primary care physician; Lavera Guise, MD

## 2014-11-10 NOTE — Progress Notes (Signed)
Pt obs to be anxious pulling off BiPap, medication ordered, however daughter refuses to give anxiety medication, pt sats 85% on BiPap 85%O2

## 2014-11-10 NOTE — Consult Note (Signed)
After further evaluation and discussion with family, Patient will be emergently intubated, sedated and placed on full MV support    Overall, patient is critically ill, prognosis is guarded.  Patient with Multiorgan failure and at high risk for cardiac arrest and death.    Corrin Parker, M.D.  [facility]  Medical Director [place] Director [facility] Cardio-Pulmonary Department

## 2014-11-10 NOTE — Progress Notes (Signed)
Critical value K+ 2.9.  E-link MD informed.  New orders received.

## 2014-11-10 NOTE — Progress Notes (Signed)
PULMONARY / CRITICAL CARE MEDICINE   Name: Felicia Vincent MRN: [id number] DOB: 23-Sep-1945    ADMISSION DATE:  11/07/2014  INITIAL PRESENTATION:  12 F initially seen as outpt one week PTA with complaints of weakness, dyspnea, cough and treated for PNA as outpt with levofloxacin. Was intolerant of this medication due to nausea and was changed to amoxicillin 4 days PTA. Seen in ED 9/13 with worsening dyspnea and admitted with dx of acute hypoxic resp failure due to CAP  MAJOR EVENTS/TEST RESULTS: 9/13 Admitted with above history. Transferred one unit PRBCs for Hgb 6.8.  9/14 Nephrology consultation 9/14 GI consultation. No indication for endoscopy 9/14 PCCM consultation for worsening hypoxic failure. CXR deemed c/w CAP 9/15 Pt improved with less dyspnea. Cr improving 9/16 Worsening hypoxemia. CXR c/w edema. Furosemide gtt initiated by Renal service. Cycling between BiPAP and high flow O2. Intermittently agitated. Low dose lorazepam ordered  INDWELLING DEVICES::   MICRO DATA: MRSA PCR 9/13 >> NEG C diff PCR 9/14 >> NEG Blood 9/13 >>   ANTIMICROBIALS:  Pip-tazo 9/13 >> 9/14 Vanc 9/13 >> 9/16  Azithromycin 9/13 >>  Ceftriaxone 9/14 >>    SUBJECTIVE:  More dyspneic. Intermittently agitated. Does not tolerate BiPAP well  VITAL SIGNS: Temp:  [96.2 F (35.7 C)-98.5 F (36.9 C)] 98.5 F (36.9 C) (09/16 1155) Pulse Rate:  [75-87] 83 (09/16 1000) Resp:  [27-34] 33 (09/16 1000) BP: (161-186)/(59-76) 175/59 mmHg (09/16 1000) SpO2:  [86 %-97 %] 95 % (09/16 1000) FiO2 (%):  [60 %-100 %] 100 % (09/16 1335) HEMODYNAMICS:   VENTILATOR SETTINGS: Vent Mode:  [-]  FiO2 (%):  [60 %-100 %] 100 % INTAKE / OUTPUT:  Intake/Output Summary (Last 24 hours) at 11/10/14 1438 Last data filed at 11/10/14 0800  Gross per 24 hour  Intake    830 ml  Output   1900 ml  Net  -1070 ml    PHYSICAL EXAMINATION: General: Mildly dyspneic Neuro: CNs intact, motor/sens intact, DTRs symmetric HEENT:  NCAT Necy: no jvd Cardiovascular: reg, no M Lungs: coarse rales throughout, no wheezes Abdomen: Soft, NT, +BS Ext: no C/C/E  LABS:  CBC  Recent Labs Lab 11/08/14 0554 11/09/14 0433 11/10/14 0511  WBC 14.5* 22.1* 27.5*  HGB 8.5* 8.2* 8.5*  HCT 23.9* 24.0* 23.8*  PLT 280 351 372   Coag's No results for input(s): APTT, INR in the last 168 hours. BMET  Recent Labs Lab 11/09/14 0433 11/10/14 0511 11/10/14 1302  NA 138 138 138  K 5.7* 2.9* 4.2  CL 106 105 106  CO2 26 26 25   BUN 48* 40* 41*  CREATININE 3.09* 2.76* 2.75*  GLUCOSE 51* 139* 205*   Electrolytes  Recent Labs Lab 11/09/14 0433 11/10/14 0511 11/10/14 1302  CALCIUM 7.9* 8.1* 8.3*  MG  --   --  1.6*   Sepsis Markers  Recent Labs Lab 11/07/14 1248 11/07/14 1802  LATICACIDVEN 1.0 1.5   ABG  Recent Labs Lab 11/07/14 1340  PHART 7.39  PCO2ART 38  PO2ART 71*   Liver Enzymes  Recent Labs Lab 11/07/14 1230  AST 12*  ALT 10*  ALKPHOS 78  BILITOT 0.9  ALBUMIN 2.4*   Cardiac Enzymes  Recent Labs Lab 11/07/14 1230  TROPONINI <0.03   Glucose  Recent Labs Lab 11/09/14 1150 11/09/14 1411 11/09/14 1738 11/09/14 2142 11/10/14 0744 11/10/14 1203  GLUCAP 185* 168* 260* 224* 110* 155*    CXR: edema pattern  ASSESSMENT / PLAN:  PULMONARY A: Acute hypoxic resp failure  due to CAP P:   Cont high flow O2 by [place] Cont PRN BiPAP Cont nebulized bronchodilators  CARDIOVASCULAR A:  Hypertension P:  Cont current antihypertensive regimen Goal SBP < 170 mmHg PRN hydralazine ordered 9/16  RENAL A:  AKI, nonoliguric  CKD Hyperkalemia, resolved Hypokalemia Hypervolemia P:   Monitor BMET intermittently Monitor I/Os Correct electrolytes as indicated Avoid nephrotoxins Renal Service following - furosemide infusion ordered 9/16 Repeat BMET 9/16 afternoon  GASTROINTESTINAL A:   No issues P:   SUP: N/I presently Cont carb mod diet if able to remain off  BiPAP  HEMATOLOGIC A:   Anemia, unclear etiology. No overt bleeding presently P:  DVT px: SCDs Monitor CBC intermittently Transfuse per usual guidelines  INFECTIOUS A:   CAP, NOS P:   Monitor temp, WBC count Micro and abx as above  ENDOCRINE A:  DM2, improved control Episode of hypoglycemia 9/15 P:   Cont Levemir Cont oral agents Cont SSI Goal CBG < 180  NEUROLOGIC A:  Agitation, BiPAP intolerance P:   RASS goal: 0 Low dose lorazepam ordered    Merton Border, MD PCCM service Mobile [phone] Pager [phone]   11/10/2014, 2:38 PM

## 2014-11-10 NOTE — Care Management Note (Signed)
Case Management Note  Patient Details  Name: Felicia Vincent MRN: [id number] Date of Birth: 02-19-1946  Subjective/Objective:   Admitted with bilateral PNA. Patient was treated by the ED and her PCP, Dr. Humphrey Rolls for right lobe PNA but did not take the antibiotics as prescribed. Met with patient and daughter, Felicia Vincent at bedside. She states patient lives at home. Her son lives with her. Prior to admission patient was independent, requiring no assistive devices or home O2. She does not drive. No current home health. Daughter expressed concerns that her and her brother feel that patient should be showing more improvement after 3 days on antibiotics. Felicia Vincent states she would like to speak further with the MD and if she continues to show no improvement the family would like her transferred to [facility]. Will discuss with team in progression and Dr. Leonidas Romberg.  List of home health agencies provided.                 Action/Plan:   Expected Discharge Date:                  Expected Discharge Plan:  [place]  In-House Referral:     Discharge planning Services  CM Consult  Post Acute Care Choice:  Home Health Choice offered to:  Adult Children  DME Arranged:    DME Agency:     HH Arranged:    HH Agency:     Status of Service:  In process, will continue to follow  Medicare Important Message Given:    Date Medicare IM Given:    Medicare IM give by:    Date Additional Medicare IM Given:    Additional Medicare Important Message give by:     If discussed at [place] of Stay Meetings, dates discussed:    Additional Comments:  Jolly Mango, RN 11/10/2014, 9:04 AM

## 2014-11-11 ENCOUNTER — Inpatient Hospital Stay: Payer: Medicare Other

## 2014-11-11 LAB — GLUCOSE, CAPILLARY
GLUCOSE-CAPILLARY: 106 mg/dL — AB (ref 65–99)
GLUCOSE-CAPILLARY: 136 mg/dL — AB (ref 65–99)
GLUCOSE-CAPILLARY: 139 mg/dL — AB (ref 65–99)
GLUCOSE-CAPILLARY: 158 mg/dL — AB (ref 65–99)
GLUCOSE-CAPILLARY: 160 mg/dL — AB (ref 65–99)
GLUCOSE-CAPILLARY: 192 mg/dL — AB (ref 65–99)
GLUCOSE-CAPILLARY: 219 mg/dL — AB (ref 65–99)
GLUCOSE-CAPILLARY: 220 mg/dL — AB (ref 65–99)
GLUCOSE-CAPILLARY: 248 mg/dL — AB (ref 65–99)
GLUCOSE-CAPILLARY: 260 mg/dL — AB (ref 65–99)
GLUCOSE-CAPILLARY: 300 mg/dL — AB (ref 65–99)
GLUCOSE-CAPILLARY: 375 mg/dL — AB (ref 65–99)
GLUCOSE-CAPILLARY: 377 mg/dL — AB (ref 65–99)
Glucose-Capillary: 381 mg/dL — ABNORMAL HIGH (ref 65–99)
Glucose-Capillary: 347 mg/dL — ABNORMAL HIGH (ref 65–99)
Glucose-Capillary: 170 mg/dL — ABNORMAL HIGH (ref 65–99)
Glucose-Capillary: 145 mg/dL — ABNORMAL HIGH (ref 65–99)
Glucose-Capillary: 113 mg/dL — ABNORMAL HIGH (ref 65–99)
Glucose-Capillary: 132 mg/dL — ABNORMAL HIGH (ref 65–99)
Glucose-Capillary: 226 mg/dL — ABNORMAL HIGH (ref 65–99)
Glucose-Capillary: 200 mg/dL — ABNORMAL HIGH (ref 65–99)

## 2014-11-11 LAB — BLOOD GAS, ARTERIAL
ACID-BASE DEFICIT: 7.9 mmol/L — AB (ref 0.0–2.0)
Bicarbonate: 17.7 mEq/L — ABNORMAL LOW (ref 21.0–28.0)
FIO2: 0.85
MECHVT: 500 mL
Mechanical Rate: 25
O2 Saturation: 99.5 %
PEEP/CPAP: 5 cmH2O
PH ART: 7.3 — AB (ref 7.350–7.450)
PO2 ART: 178 mmHg — AB (ref 83.0–108.0)
Patient temperature: 37
RATE: 25 resp/min
pCO2 arterial: 36 mmHg (ref 32.0–48.0)

## 2014-11-11 LAB — CBC
HEMATOCRIT: 19.3 % — AB (ref 35.0–47.0)
Hemoglobin: 6.4 g/dL — ABNORMAL LOW (ref 12.0–16.0)
MCH: 28.9 pg (ref 26.0–34.0)
MCHC: 33.3 g/dL (ref 32.0–36.0)
MCV: 86.9 fL (ref 80.0–100.0)
PLATELETS: 335 10*3/uL (ref 150–440)
RBC: 2.22 MIL/uL — ABNORMAL LOW (ref 3.80–5.20)
RDW: 16.6 % — AB (ref 11.5–14.5)
WBC: 17.7 10*3/uL — AB (ref 3.6–11.0)

## 2014-11-11 LAB — PREPARE RBC (CROSSMATCH)

## 2014-11-11 LAB — HEMOGLOBIN: HEMOGLOBIN: 6.5 g/dL — AB (ref 12.0–16.0)

## 2014-11-11 LAB — BASIC METABOLIC PANEL
Anion gap: 11 (ref 5–15)
BUN: 61 mg/dL — AB (ref 6–20)
CHLORIDE: 105 mmol/L (ref 101–111)
CO2: 21 mmol/L — AB (ref 22–32)
Calcium: 8.1 mg/dL — ABNORMAL LOW (ref 8.9–10.3)
Creatinine, Ser: 3.21 mg/dL — ABNORMAL HIGH (ref 0.44–1.00)
GFR calc Af Amer: 16 mL/min — ABNORMAL LOW (ref >60)
GFR calc non Af Amer: 14 mL/min — ABNORMAL LOW (ref >60)
GLUCOSE: 349 mg/dL — AB (ref 65–99)
POTASSIUM: 3.6 mmol/L (ref 3.5–5.1)
Sodium: 137 mmol/L (ref 135–145)

## 2014-11-11 MED ORDER — PRO-STAT SUGAR FREE PO LIQD
30.0000 mL | Freq: Every day | ORAL | Status: DC
  Administered 2014-11-11 – 2014-11-12 (×2): 30 mL via ORAL

## 2014-11-11 MED ORDER — VITAL HIGH PROTEIN PO LIQD
1000.0000 mL | ORAL | Status: DC
  Administered 2014-11-11 (×3)
  Administered 2014-11-11: 1000 mL
  Administered 2014-11-11 – 2014-11-12 (×3)
  Administered 2014-11-12: 1000 mL
  Administered 2014-11-12 (×6)

## 2014-11-11 MED ORDER — FREE WATER
25.0000 mL | Status: DC
  Administered 2014-11-11 – 2014-11-13 (×11): 25 mL

## 2014-11-11 MED ORDER — SODIUM CHLORIDE 0.9 % IV SOLN
Freq: Once | INTRAVENOUS | Status: AC
  Administered 2014-11-11: 17:00:00 via INTRAVENOUS

## 2014-11-11 MED ORDER — IPRATROPIUM-ALBUTEROL 0.5-2.5 (3) MG/3ML IN SOLN
3.0000 mL | RESPIRATORY_TRACT | Status: DC
  Administered 2014-11-11 – 2014-11-17 (×37): 3 mL via RESPIRATORY_TRACT
  Filled 2014-11-11 (×39): qty 3

## 2014-11-11 MED ORDER — IPRATROPIUM-ALBUTEROL 0.5-2.5 (3) MG/3ML IN SOLN: 3.0000 mL | Freq: Four times a day (QID) | RESPIRATORY_TRACT | Status: DC

## 2014-11-11 MED ORDER — BUDESONIDE 0.25 MG/2ML IN SUSP
0.2500 mg | Freq: Two times a day (BID) | RESPIRATORY_TRACT | Status: DC
  Administered 2014-11-11 – 2014-11-17 (×13): 0.25 mg via RESPIRATORY_TRACT
  Filled 2014-11-11 (×12): qty 2
  Filled 2014-11-11: qty 4
  Filled 2014-11-11 (×2): qty 2

## 2014-11-11 MED ORDER — NOREPINEPHRINE BITARTRATE 1 MG/ML IV SOLN
0.0000 ug/min | INTRAVENOUS | Status: DC
  Administered 2014-11-11: 3 ug/min via INTRAVENOUS
  Filled 2014-11-11 (×2): qty 16

## 2014-11-11 NOTE — Consult Note (Signed)
Reason for Consult: Respiratory failure atrial fibrillation Referring Physician: Clayborn Bigness , hospitalist  Felicia Vincent is an 69 y.o. female.  HPI: 69 year old female known history of hypertension diabetes chronic renal insufficiency previous CVA was done reasonably well but started having shortness of breath sound have right-sided pneumonia not able to tolerate Levaquin and developed nausea and sickness seen by primary care physician is listed to amoxicillin since then patient got progressively worse and her breathing was shortness of breath and subsequently had a dry cough intermittent fevers she was brought to the emergency room placed on BiPAP for impending listed failure subsequently required intubation broad-spectrum antibiotic therapy critical care treatment. Patient found to be in atrial fibrillation intubated sedated at this point  Past Medical History  Diagnosis Date  . Hypertension   . Diabetes mellitus without complication   . TIA (transient ischemic attack)   . Cataract   . Stroke   . Dizziness   . Anemia   . Pneumonia     Past Surgical History  Procedure Laterality Date  . Cholecystectomy    . Lithotripsy    . Abdominal hysterectomy    . Breast surgery      No family history on file.  Social History:  reports that she has never smoked. She has never used smokeless tobacco. She reports that she does not drink alcohol or use illicit drugs.  Allergies:  Allergies  Allergen Reactions  . Codeine Nausea Only  . Prednisone Other (See Comments)    Pts daughter states that this medication "makes her evil".      Medications: I have reviewed the patient's current medications.  Results for orders placed or performed during the hospital encounter of 11/07/14 (from the past 48 hour(s))  Glucose, capillary     Status: Abnormal   Collection Time: 11/09/14 11:50 AM  Result Value Ref Range   Glucose-Capillary 185 (H) 65 - 99 mg/dL  Glucose, capillary     Status: Abnormal    Collection Time: 11/09/14  2:11 PM  Result Value Ref Range   Glucose-Capillary 168 (H) 65 - 99 mg/dL  Glucose, capillary     Status: Abnormal   Collection Time: 11/09/14  5:38 PM  Result Value Ref Range   Glucose-Capillary 260 (H) 65 - 99 mg/dL  Glucose, capillary     Status: Abnormal   Collection Time: 11/09/14  9:42 PM  Result Value Ref Range   Glucose-Capillary 224 (H) 65 - 99 mg/dL  Basic metabolic panel     Status: Abnormal   Collection Time: 11/10/14  5:11 AM  Result Value Ref Range   Sodium 138 135 - 145 mmol/L   Potassium 2.9 (LL) 3.5 - 5.1 mmol/L    Comment: CRITICAL RESULT CALLED TO, READ BACK BY AND VERIFIED WITH DALE HOPKINS 11/10/2014 0548 [facility]   Chloride 105 101 - 111 mmol/L   CO2 26 22 - 32 mmol/L   Glucose, Bld 139 (H) 65 - 99 mg/dL   BUN 40 (H) 6 - 20 mg/dL   Creatinine, Ser 2.76 (H) 0.44 - 1.00 mg/dL   Calcium 8.1 (L) 8.9 - 10.3 mg/dL   GFR calc non Af Amer 16 (L) >60 mL/min   GFR calc Af Amer 19 (L) >60 mL/min    Comment: (NOTE) The eGFR has been calculated using the CKD EPI equation. This calculation has not been validated in all clinical situations. eGFR's persistently <60 mL/min signify possible Chronic Kidney Disease.    Anion gap 7 5 - 15  CBC     Status: Abnormal   Collection Time: 11/10/14  5:11 AM  Result Value Ref Range   WBC 27.5 (H) 3.6 - 11.0 K/uL   RBC 2.42 (L) 3.80 - 5.20 MIL/uL   Hemoglobin 8.5 (L) 12.0 - 16.0 g/dL   HCT 23.8 (L) 35.0 - 47.0 %   MCV 98.3 80.0 - 100.0 fL   MCH 35.3 (H) 26.0 - 34.0 pg   MCHC 35.9 32.0 - 36.0 g/dL   RDW 16.4 (H) 11.5 - 14.5 %   Platelets 372 150 - 440 K/uL  Brain natriuretic peptide     Status: Abnormal   Collection Time: 11/10/14  5:11 AM  Result Value Ref Range   B Natriuretic Peptide 436.0 (H) 0.0 - 100.0 pg/mL  Glucose, capillary     Status: Abnormal   Collection Time: 11/10/14  7:44 AM  Result Value Ref Range   Glucose-Capillary 110 (H) 65 - 99 mg/dL  Glucose, capillary     Status: Abnormal    Collection Time: 11/10/14 12:03 PM  Result Value Ref Range   Glucose-Capillary 155 (H) 65 - 99 mg/dL  Basic metabolic panel     Status: Abnormal   Collection Time: 11/10/14  1:02 PM  Result Value Ref Range   Sodium 138 135 - 145 mmol/L   Potassium 4.2 3.5 - 5.1 mmol/L   Chloride 106 101 - 111 mmol/L   CO2 25 22 - 32 mmol/L   Glucose, Bld 205 (H) 65 - 99 mg/dL   BUN 41 (H) 6 - 20 mg/dL   Creatinine, Ser 2.75 (H) 0.44 - 1.00 mg/dL   Calcium 8.3 (L) 8.9 - 10.3 mg/dL   GFR calc non Af Amer 16 (L) >60 mL/min   GFR calc Af Amer 19 (L) >60 mL/min    Comment: (NOTE) The eGFR has been calculated using the CKD EPI equation. This calculation has not been validated in all clinical situations. eGFR's persistently <60 mL/min signify possible Chronic Kidney Disease.    Anion gap 7 5 - 15  Magnesium     Status: Abnormal   Collection Time: 11/10/14  1:02 PM  Result Value Ref Range   Magnesium 1.6 (L) 1.7 - 2.4 mg/dL  Blood gas, arterial     Status: Abnormal (Preliminary result)   Collection Time: 11/10/14  4:55 PM  Result Value Ref Range   FIO2 0.85    Delivery systems BILEVEL POSITIVE AIRWAY PRESSURE    Inspiratory PAP 12    Expiratory PAP 6.0    pH, Arterial 7.32 (L) 7.350 - 7.450   pCO2 arterial 45 32.0 - 48.0 mmHg   pO2, Arterial 69 (L) 83.0 - 108.0 mmHg   Bicarbonate 23.2 21.0 - 28.0 mEq/L   Acid-base deficit 2.8 (H) 0.0 - 2.0 mmol/L   O2 Saturation 91.9 %   Patient temperature 37.0    Oxygen index PENDING    Collection site RIGHT RADIAL    Sample type ARTERIAL DRAW    Allens test (pass/fail) YES (A) PASS  Glucose, capillary     Status: Abnormal   Collection Time: 11/10/14  4:58 PM  Result Value Ref Range   Glucose-Capillary 256 (H) 65 - 99 mg/dL  Basic metabolic panel     Status: Abnormal   Collection Time: 11/10/14  5:27 PM  Result Value Ref Range   Sodium 137 135 - 145 mmol/L   Potassium 4.7 3.5 - 5.1 mmol/L   Chloride 105 101 - 111 mmol/L   CO2 21 (  L) 22 - 32 mmol/L    Glucose, Bld 336 (H) 65 - 99 mg/dL   BUN 45 (H) 6 - 20 mg/dL   Creatinine, Ser 2.79 (H) 0.44 - 1.00 mg/dL   Calcium 8.5 (L) 8.9 - 10.3 mg/dL   GFR calc non Af Amer 16 (L) >60 mL/min   GFR calc Af Amer 19 (L) >60 mL/min    Comment: (NOTE) The eGFR has been calculated using the CKD EPI equation. This calculation has not been validated in all clinical situations. eGFR's persistently <60 mL/min signify possible Chronic Kidney Disease.    Anion gap 11 5 - 15  Glucose, capillary     Status: Abnormal   Collection Time: 11/10/14  9:51 PM  Result Value Ref Range   Glucose-Capillary 342 (H) 65 - 99 mg/dL  Blood gas, arterial     Status: Abnormal   Collection Time: 11/10/14 10:20 PM  Result Value Ref Range   FIO2 0.85    Delivery systems VENTILATOR    Mode PRESSURE REGULATED VOLUME CONTROL    VT 450 mL   LHR 16 resp/min   Peep/cpap 5.0 cm H20   pH, Arterial 7.23 (L) 7.350 - 7.450   pCO2 arterial 58 (H) 32.0 - 48.0 mmHg   pO2, Arterial 77 (L) 83.0 - 108.0 mmHg   Bicarbonate 24.3 21.0 - 28.0 mEq/L   Acid-base deficit 4.1 (H) 0.0 - 2.0 mmol/L   O2 Saturation 92.4 %   Patient temperature 37.0    Collection site LEFT RADIAL    Sample type ARTERIAL DRAW    Allens test (pass/fail) PASS PASS   Mechanical Rate 16.0   Glucose, capillary     Status: Abnormal   Collection Time: 11/11/14 12:28 AM  Result Value Ref Range   Glucose-Capillary 377 (H) 65 - 99 mg/dL  Blood gas, arterial     Status: Abnormal   Collection Time: 11/11/14  1:21 AM  Result Value Ref Range   FIO2 0.85    Delivery systems VENTILATOR    Mode PRESSURE REGULATED VOLUME CONTROL    VT 500 mL   LHR 25 resp/min   Peep/cpap 5.0 cm H20   pH, Arterial 7.30 (L) 7.350 - 7.450   pCO2 arterial 36 32.0 - 48.0 mmHg   pO2, Arterial 178 (H) 83.0 - 108.0 mmHg   Bicarbonate 17.7 (L) 21.0 - 28.0 mEq/L   Acid-base deficit 7.9 (H) 0.0 - 2.0 mmol/L   O2 Saturation 99.5 %   Patient temperature 37.0    Collection site RIGHT RADIAL     Sample type ARTERIAL DRAW    Allens test (pass/fail) PASS PASS   Mechanical Rate 25   Glucose, capillary     Status: Abnormal   Collection Time: 11/11/14  1:30 AM  Result Value Ref Range   Glucose-Capillary 381 (H) 65 - 99 mg/dL   Comment 1 Glucose Stabilizer   Glucose, capillary     Status: Abnormal   Collection Time: 11/11/14  2:39 AM  Result Value Ref Range   Glucose-Capillary 375 (H) 65 - 99 mg/dL   Comment 1 Glucose Stabilizer   Glucose, capillary     Status: Abnormal   Collection Time: 11/11/14  3:55 AM  Result Value Ref Range   Glucose-Capillary 347 (H) 65 - 99 mg/dL  Basic metabolic panel     Status: Abnormal   Collection Time: 11/11/14  4:39 AM  Result Value Ref Range   Sodium 137 135 - 145 mmol/L   Potassium 3.6 3.5 -  5.1 mmol/L   Chloride 105 101 - 111 mmol/L   CO2 21 (L) 22 - 32 mmol/L   Glucose, Bld 349 (H) 65 - 99 mg/dL   BUN 61 (H) 6 - 20 mg/dL   Creatinine, Ser 3.21 (H) 0.44 - 1.00 mg/dL   Calcium 8.1 (L) 8.9 - 10.3 mg/dL   GFR calc non Af Amer 14 (L) >60 mL/min   GFR calc Af Amer 16 (L) >60 mL/min    Comment: (NOTE) The eGFR has been calculated using the CKD EPI equation. This calculation has not been validated in all clinical situations. eGFR's persistently <60 mL/min signify possible Chronic Kidney Disease.    Anion gap 11 5 - 15  Glucose, capillary     Status: Abnormal   Collection Time: 11/11/14  5:03 AM  Result Value Ref Range   Glucose-Capillary 300 (H) 65 - 99 mg/dL   Comment 1 Glucose Stabilizer   CBC     Status: Abnormal   Collection Time: 11/11/14  5:43 AM  Result Value Ref Range   WBC 17.7 (H) 3.6 - 11.0 K/uL   RBC 2.22 (L) 3.80 - 5.20 MIL/uL   Hemoglobin 6.4 (L) 12.0 - 16.0 g/dL   HCT 19.3 (L) 35.0 - 47.0 %   MCV 86.9 80.0 - 100.0 fL   MCH 28.9 26.0 - 34.0 pg   MCHC 33.3 32.0 - 36.0 g/dL   RDW 16.6 (H) 11.5 - 14.5 %   Platelets 335 150 - 440 K/uL  Glucose, capillary     Status: Abnormal   Collection Time: 11/11/14  6:12 AM  Result  Value Ref Range   Glucose-Capillary 260 (H) 65 - 99 mg/dL   Comment 1 Glucose Stabilizer   Glucose, capillary     Status: Abnormal   Collection Time: 11/11/14  7:07 AM  Result Value Ref Range   Glucose-Capillary 248 (H) 65 - 99 mg/dL  Glucose, capillary     Status: Abnormal   Collection Time: 11/11/14  8:13 AM  Result Value Ref Range   Glucose-Capillary 219 (H) 65 - 99 mg/dL  Blood gas, arterial     Status: Abnormal (Preliminary result)   Collection Time: 11/11/14  8:30 AM  Result Value Ref Range   FIO2 0.50    Delivery systems VENTILATOR    Mode PRESSURE REGULATED VOLUME CONTROL    VT 500 mL   LHR 25 resp/min   Peep/cpap 5.0 cm H20   Inspiratory PAP PENDING    Expiratory PAP PENDING    pH, Arterial 7.36 7.350 - 7.450   pCO2 arterial 36 32.0 - 48.0 mmHg   pO2, Arterial 50 (L) 83.0 - 108.0 mmHg   Bicarbonate 20.3 (L) 21.0 - 28.0 mEq/L   Acid-base deficit 4.7 (H) 0.0 - 2.0 mmol/L   O2 Saturation 83.3 %   Patient temperature 37.0    Oxygen index PENDING    Collection site RIGHT RADIAL    Sample type ARTERIAL DRAW    Allens test (pass/fail) YES (A) PASS  Hemoglobin     Status: Abnormal   Collection Time: 11/11/14  8:54 AM  Result Value Ref Range   Hemoglobin 6.5 (L) 12.0 - 16.0 g/dL  Glucose, capillary     Status: Abnormal   Collection Time: 11/11/14  9:39 AM  Result Value Ref Range   Glucose-Capillary 170 (H) 65 - 99 mg/dL  Glucose, capillary     Status: Abnormal   Collection Time: 11/11/14 10:43 AM  Result Value Ref Range  Glucose-Capillary 145 (H) 65 - 99 mg/dL    Dg Abd 1 View  11/10/2014   CLINICAL DATA:  Encounter for intubation Z01.818 (ICD-10-CM) Encounter for orogastric (OG) tube placement Z46.59 (ICD-10-CM)  EXAM: ABDOMEN - 1 VIEW  COMPARISON:  None.  FINDINGS: Orogastric tube passes well below the diaphragm into the distal stomach, projecting in the right mid abdomen.  There is a paucity of bowel gas.  Vascular calcifications are noted.  Lung bases irregular  interstitial airspace opacities.  IMPRESSION: Orogastric tube tip projects in the distal stomach.   Electronically Signed   By: Lajean Manes M.D.   On: 11/10/2014 20:29   Dg Chest Port 1 View  11/11/2014   CLINICAL DATA:  Respiratory failure, ventilatory support  EXAM: PORTABLE CHEST - 1 VIEW  COMPARISON:  11/10/2014  FINDINGS: Endotracheal tube 1.6 cm above the carina. NG tube extends into the stomach with the tip not visualized. Right IJ central line tip proximal right atrium as before. Severe patchy bilateral airspace process throughout both lungs with some sparing of the left upper lobe compatible with pneumonia versus alveolar edema or hemorrhage. No enlarging effusion or pneumothorax. No significant interval change.  IMPRESSION: Stable support apparatus  Persistent asymmetric bilateral airspace disease compatible with pneumonia. Little interval change.   Electronically Signed   By: Jerilynn Mages.  Shick M.D.   On: 11/11/2014 09:31   Dg Chest Port 1 View  11/10/2014   CLINICAL DATA:  Intubated  EXAM: PORTABLE CHEST - 1 VIEW  COMPARISON:  11/10/2014  FINDINGS: Cardiomediastinal silhouette is stable. Bilateral airspace opacities are again noted without change in aeration. Endotracheal tube in place with tip 2.8 cm above the carina. NG tube in place with tip in proximal stomach. There is no pneumothorax. There is right IJ central line with tip in SVC right atrium junction.  IMPRESSION: Endotracheal and NG tube in place. Again noted bilateral airspace opacities without change in aeration. No pneumothorax.   Electronically Signed   By: Lahoma Crocker M.D.   On: 11/10/2014 20:20   Dg Chest Port 1 View  11/10/2014   CLINICAL DATA:  Evaluate pneumonia.  EXAM: PORTABLE CHEST - 1 VIEW  COMPARISON:  11/09/2014  FINDINGS: The normal heart size. Aortic atherosclerosis identified. Persistent bilateral pulmonary opacities identified. There is slightly improved aeration to the right upper lobe with worsening aeration to the right  lower lobe.  IMPRESSION: 1. Persistent bilateral airspace opacities with slightly improved aeration to the right upper lobe.   Electronically Signed   By: Kerby Moors M.D.   On: 11/10/2014 09:31    Review of Systems  Unable to perform ROS  Blood pressure 115/47, pulse 96, temperature 97.2 F (36.2 C), temperature source Axillary, resp. rate 26, height $RemoveBe'5\' 1"'[id number]$  (1.549 m), weight 88 kg (194 lb 0.1 oz), SpO2 98 %. Physical Exam  Constitutional: She appears well-developed and well-nourished. She is intubated.  HENT:  Head: Normocephalic and atraumatic.  Eyes: Conjunctivae are normal. Pupils are equal, round, and reactive to light.  Neck: Normal range of motion. Neck supple.  Cardiovascular: Normal pulses.  An irregularly irregular rhythm present. Tachycardia present.  Exam reveals distant heart sounds and friction rub.   Murmur heard.  Systolic murmur is present with a grade of 2/6  Respiratory: Tachypnea noted. She is intubated.  GI: Soft. Bowel sounds are normal.  Neurological: She is unresponsive.  Skin: Skin is warm and dry.    Assessment/Plan: Respiratory failure Comprehensive to pneumonia Atrial fibrillation Diabetes History of CVA  Acute on chronic renal insufficiency Hyperlipidemia Anemia . PLAN Agree with ICU level care Continue vent support and critical care involvement Continue broad-spectrum antibiotic therapy for pneumonia Continue inhalers as necessary Rate control for atrial fibrillation Continue sotalol for antiarrhythmic control Consider long-term anticoagulation Continue hypertension control GERD therapy with Prevacid Continue insulin therapy for diabetes  CALLWOOD,DWAYNE D. 11/11/2014, 11:29 AM

## 2014-11-11 NOTE — Progress Notes (Signed)
Pt am labs hemoglobin 6.4, was 8.5 yesterday.  VSS, pt asymptomatic, no signs of bleeding.  Notified Dr. Reece Levy of hemoglobin, per Dr. Reece Levy day rounding physician can enter orders.  Pt also converted to a-fib, no new orders regarding Dr. Reece Levy.

## 2014-11-11 NOTE — Progress Notes (Signed)
On this morning's am labs, creatinine has risen to 3.21, was 2.79.  Pt is currently on lasix drip that was ordered by nephrology.  Called and spoke with Dr. Reece Levy regarding pt creatinine increase and whether to continue lasix drip.  Per Dr. Reece Levy, nephrology to address drip when they round this morning.

## 2014-11-11 NOTE — Progress Notes (Signed)
[facility] NAME: Cynara Tatham    MR#:  [id number]  DATE OF BIRTH:  15-Jan-1946  SUBJECTIVE:  Got inutbated due to impending respiratory failure  REVIEW OF SYSTEMS:   Review of Systems  Unable to perform ROS: intubated   DRUG ALLERGIES:   Allergies  Allergen Reactions  . Codeine Nausea Only  . Prednisone Other (See Comments)    Pts daughter states that this medication "makes her evil".      VITALS:  Blood pressure 128/53, pulse 60, temperature 98 F (36.7 C), temperature source Axillary, resp. rate 25, height 5\' 1"  (1.549 m), weight 92 kg (202 lb 13.2 oz), SpO2 99 %.  PHYSICAL EXAMINATION:   Physical Exam  GENERAL:  69 y.o.-year-old patient lying in the bed with no acute distress. critically ill EYES: Pupils equal, round, reactive to light and accommodation. No scleral icterus. Extraocular muscles intact.  HEENT: Head atraumatic, normocephalic. Oropharynx and nasopharynx clear. Intubated and on the vent NECK:  Supple, no jugular venous distention. No thyroid enlargement, no tenderness.  LUNGS: Normal breath sounds bilaterally, no wheezing, rales, rhonchi. No use of accessory muscles of respiration.  CARDIOVASCULAR: S1, S2 normal. No murmurs, rubs, or gallops.  ABDOMEN: Soft, nontender, nondistended. Bowel sounds present. No organomegaly or mass.  EXTREMITIES: No cyanosis, clubbing or edema b/l.    NEUROLOGIC: unable to assess, on the vent PSYCHIATRIC: unable to assess, on the vent SKIN: No obvious rash, lesion, or ulcer.   LABORATORY PANEL:   CBC  Recent Labs Lab 11/11/14 0543 11/11/14 0854  WBC 17.7*  --   HGB 6.4* 6.5*  HCT 19.3*  --   PLT 335  --     Chemistries   Recent Labs Lab 11/07/14 1230  11/10/14 1302  11/11/14 0439  NA 126*  < > 138  < > 137  K 5.6*  < > 4.2  < > 3.6  CL 93*  < > 106  < > 105  CO2 23  < > 25  < > 21*  GLUCOSE 575*  < > 205*  < > 349*  BUN 71*  < > 41*  < > 61*   CREATININE 4.63*  < > 2.75*  < > 3.21*  CALCIUM 7.9*  < > 8.3*  < > 8.1*  MG  --   --  1.6*  --   --   AST 12*  --   --   --   --   ALT 10*  --   --   --   --   ALKPHOS 78  --   --   --   --   BILITOT 0.9  --   --   --   --   < > = values in this interval not displayed.  Cardiac Enzymes  Recent Labs Lab 11/07/14 1230  TROPONINI <0.03    RADIOLOGY:  Dg Abd 1 View  11/10/2014   CLINICAL DATA:  Encounter for intubation Z01.818 (ICD-10-CM) Encounter for orogastric (OG) tube placement Z46.59 (ICD-10-CM)  EXAM: ABDOMEN - 1 VIEW  COMPARISON:  None.  FINDINGS: Orogastric tube passes well below the diaphragm into the distal stomach, projecting in the right mid abdomen.  There is a paucity of bowel gas.  Vascular calcifications are noted.  Lung bases irregular interstitial airspace opacities.  IMPRESSION: Orogastric tube tip projects in the distal stomach.   Electronically Signed   By: Lajean Manes M.D.   On:  11/10/2014 20:29   Dg Chest Port 1 View  11/11/2014   CLINICAL DATA:  Respiratory failure, ventilatory support  EXAM: PORTABLE CHEST - 1 VIEW  COMPARISON:  11/10/2014  FINDINGS: Endotracheal tube 1.6 cm above the carina. NG tube extends into the stomach with the tip not visualized. Right IJ central line tip proximal right atrium as before. Severe patchy bilateral airspace process throughout both lungs with some sparing of the left upper lobe compatible with pneumonia versus alveolar edema or hemorrhage. No enlarging effusion or pneumothorax. No significant interval change.  IMPRESSION: Stable support apparatus  Persistent asymmetric bilateral airspace disease compatible with pneumonia. Little interval change.   Electronically Signed   By: Jerilynn Mages.  Shick M.D.   On: 11/11/2014 09:31   Dg Chest Port 1 View  11/10/2014   CLINICAL DATA:  Intubated  EXAM: PORTABLE CHEST - 1 VIEW  COMPARISON:  11/10/2014  FINDINGS: Cardiomediastinal silhouette is stable. Bilateral airspace opacities are again noted  without change in aeration. Endotracheal tube in place with tip 2.8 cm above the carina. NG tube in place with tip in proximal stomach. There is no pneumothorax. There is right IJ central line with tip in SVC right atrium junction.  IMPRESSION: Endotracheal and NG tube in place. Again noted bilateral airspace opacities without change in aeration. No pneumothorax.   Electronically Signed   By: Lahoma Crocker M.D.   On: 11/10/2014 20:20   Dg Chest Port 1 View  11/10/2014   CLINICAL DATA:  Evaluate pneumonia.  EXAM: PORTABLE CHEST - 1 VIEW  COMPARISON:  11/09/2014  FINDINGS: The normal heart size. Aortic atherosclerosis identified. Persistent bilateral pulmonary opacities identified. There is slightly improved aeration to the right upper lobe with worsening aeration to the right lower lobe.  IMPRESSION: 1. Persistent bilateral airspace opacities with slightly improved aeration to the right upper lobe.   Electronically Signed   By: Kerby Moors M.D.   On: 11/10/2014 09:31     ASSESSMENT AND PLAN:   69 year old with bilateral pneumonia  1.  acute hypoxic respiratory failure secondary to bilateral pneumonia: and now with possible CHF acute systolic/diastolic continue with  Ceftriaxone, azithromycin Chest x-rays with persistent bilateral infiltrates now concern for CHF Started on IV Lasix therapy appreciate cardiology and nephrology input  2. Acute renal failure on chronic renal failure due to dehydration: Baseline seems to be at 3.0 On IV lasix gtt  avoid nephrotoxins Received Kayexalate for hyperkalemia -poor uop  3. Hypertension continue imdur and hydralazine   4. Diabetes type 2 with hyperglycemia  -now on insulin gtt  5. History of CVA continue aspirin  6. Diarrhea C. difficile toxin is negative  6. Miscellaneous heparin for DVT prophylaxis  Case discussed with Care Management/Social Worker.  CODE STATUS: full  DVT Prophylaxis: heparin  TOTAL critical TIME TAKING CARE OF THIS  PATIENT: 4minutes.  >50% time spent on counselling and coordination of care Dr Mickel Duhamel Nadeen Landau M.D on 11/11/2014 at 4:26 PM  Between 7am to 6pm - Pager - [phone]  After 6pm go to [email or web address] - password [email or web address] [facility]  Office  [phone]  CC: Primary care physician; Lavera Guise, MD

## 2014-11-11 NOTE — Progress Notes (Signed)
Initial Nutrition Assessment     INTERVENTION:  EN: Dr Mortimer Fries wanting to start enteral nutrition at this time via OG tube.  Recommend vital high protein at goal rate of 72ml/hr with prostat q daily.  Will provide 1180 kcals, 110 g of protein and 959ml free water (from tube feeding).  Recommend minimal flush of 25 q 4 hr at this time.    NUTRITION DIAGNOSIS:   Inadequate oral intake related to acute illness as evidenced by NPO status.    GOAL:   Patient will meet greater than or equal to 90% of their needs    MONITOR:    (Energy intake, Pulmonary profile, Electrolyte and renal profile)  REASON FOR ASSESSMENT:   Ventilator    ASSESSMENT:      Pt admitted with acute hypoxia, pneumonia, acute kidney injury, intubated  Past Medical History  Diagnosis Date  . Hypertension   . Diabetes mellitus without complication   . TIA (transient ischemic attack)   . Cataract   . Stroke   . Dizziness   . Anemia   . Pneumonia      Current Nutrition: NPO  Food/Nutrition-Related History: unable to obtain at this time   Medications: IV lasix, solumedrol, levophed, KCL, psyllium, vit b 12, regular insulin  Electrolyte/Renal Profile and Glucose Profile:   Recent Labs Lab 11/10/14 1302 11/10/14 1727 11/11/14 0439  NA 138 137 137  K 4.2 4.7 3.6  CL 106 105 105  CO2 25 21* 21*  BUN 41* 45* 61*  CREATININE 2.75* 2.79* 3.21*  CALCIUM 8.3* 8.5* 8.1*  MG 1.6*  --   --   GLUCOSE 205* 336* 349*   Protein Profile:  Recent Labs Lab 11/07/14 1230  ALBUMIN 2.4*    Gastrointestinal Profile: Last BM: PTA   Nutrition-Focused Physical Exam Findings:  Unable to complete Nutrition-Focused physical exam at this time.     Weight Change: no wt loss per wt encounters     Diet Order:  Diet NPO time specified  Skin:   reviewed   Height:   Ht Readings from Last 1 Encounters:  11/07/14 5\' 1"  (1.549 m)    Weight:   Wt Readings from Last 1 Encounters:  11/07/14 194 lb  0.1 oz (88 kg)   BMI:  Body mass index is 36.68 kg/(m^2).  Estimated Nutritional Needs:   Kcal:  (11-14 kcals/kg) Using ABW of 88kg.  347-4259 kcals/d  Protein:  (2.0-2.5 g/kg) Using IBW of 48 kg 96-120 g/d  Fluid:  (25-58ml/kg) 1200-1460ml/d  EDUCATION NEEDS:   No education needs identified at this time  HIGH Care Level  Joli B. Zenia Resides, [place], [place] (pager)

## 2014-11-11 NOTE — Progress Notes (Signed)
Subjective:   Patient is critically ill Was intubated last evening due to increased work of breathing S cr worsened to 3.21 worsened from 2.79 UOP 825 cc   Objective:  Vital signs in last 24 hours:  Temp:  [97.2 F (36.2 C)-98.5 F (36.9 C)] 97.2 F (36.2 C) (09/17 0700) Pulse Rate:  [64-98] 96 (09/17 0900) Resp:  [15-39] 26 (09/17 0900) BP: (94-184)/(38-116) 115/47 mmHg (09/17 0900) SpO2:  [91 %-100 %] 98 % (09/17 0900) FiO2 (%):  [50 %-100 %] 50 % (09/17 0809)  Weight change:  Filed Weights   11/07/14 1207 11/07/14 1725  Weight: 86.864 kg (191 lb 8 oz) 88 kg (194 lb 0.1 oz)    Intake/Output:    Intake/Output Summary (Last 24 hours) at 11/11/14 0911 Last data filed at 11/11/14 0206  Gross per 24 hour  Intake     50 ml  Output    825 ml  Net   -775 ml     Physical Exam: General: frail, elderly, chronically ill-appearing, laying in the bed   HEENT ETT in place  Neck supple  Pulm/lungs bilateral basilar crackles- improved,    CVS/Heart irregular rhythm, no rub or gallop  Abdomen:  soft, nontender   Extremities: ++ peripheral edema   Neurologic: sedated  Skin: No acute rashes          Basic Metabolic Panel:   Recent Labs Lab 11/09/14 0433 11/10/14 0511 11/10/14 1302 11/10/14 1727 11/11/14 0439  NA 138 138 138 137 137  K 5.7* 2.9* 4.2 4.7 3.6  CL 106 105 106 105 105  CO2 26 26 25  21* 21*  GLUCOSE 51* 139* 205* 336* 349*  BUN 48* 40* 41* 45* 61*  CREATININE 3.09* 2.76* 2.75* 2.79* 3.21*  CALCIUM 7.9* 8.1* 8.3* 8.5* 8.1*  MG  --   --  1.6*  --   --      CBC:  Recent Labs Lab 11/07/14 1230 11/07/14 1410 11/08/14 0554 11/09/14 0433 11/10/14 0511 11/11/14 0543  WBC 11.8* 11.2* 14.5* 22.1* 27.5* 17.7*  NEUTROABS 10.2*  --   --   --   --   --   HGB 7.2* 6.8* 8.5* 8.2* 8.5* 6.4*  HCT 21.2* 20.8* 23.9* 24.0* 23.8* 19.3*  MCV 88.8 87.1 93.5 92.2 98.3 86.9  PLT 242 294 280 351 372 335      Microbiology:  Recent Results (from the past  720 hour(s))  Culture, blood (routine x 2)     Status: None (Preliminary result)   Collection Time: 11/07/14 12:30 PM  Result Value Ref Range Status   Specimen Description BLOOD RIGHT ASSIST CONTROL  Final   Special Requests   Final    BOTTLES DRAWN AEROBIC AND ANAEROBIC  2CC AERO 1 CC ANAERO   Culture NO GROWTH 4 DAYS  Final   Report Status PENDING  Incomplete  Culture, blood (routine x 2)     Status: None (Preliminary result)   Collection Time: 11/07/14 12:48 PM  Result Value Ref Range Status   Specimen Description BLOOD LEFT FATTY CASTS  Final   Special Requests BOTTLES DRAWN AEROBIC AND ANAEROBIC  1CC  Final   Culture NO GROWTH 4 DAYS  Final   Report Status PENDING  Incomplete  MRSA PCR Screening     Status: None   Collection Time: 11/07/14  6:06 PM  Result Value Ref Range Status   MRSA by PCR NEGATIVE NEGATIVE Final    Comment:  The GeneXpert MRSA Assay (FDA approved for NASAL specimens only), is one component of a comprehensive MRSA colonization surveillance program. It is not intended to diagnose MRSA infection nor to guide or monitor treatment for MRSA infections.   C difficile quick scan w PCR reflex     Status: None   Collection Time: 11/08/14  4:15 AM  Result Value Ref Range Status   C Diff antigen NEGATIVE NEGATIVE Final   C Diff toxin NEGATIVE NEGATIVE Final   C Diff interpretation Negative for C. difficile  Final    Coagulation Studies: No results for input(s): LABPROT, INR in the last 72 hours.  Urinalysis: No results for input(s): COLORURINE, LABSPEC, PHURINE, GLUCOSEU, HGBUR, BILIRUBINUR, KETONESUR, PROTEINUR, UROBILINOGEN, NITRITE, LEUKOCYTESUR in the last 72 hours.  Invalid input(s): APPERANCEUR    Imaging: Dg Abd 1 View  11/10/2014   CLINICAL DATA:  Encounter for intubation Z01.818 (ICD-10-CM) Encounter for orogastric (OG) tube placement Z46.59 (ICD-10-CM)  EXAM: ABDOMEN - 1 VIEW  COMPARISON:  None.  FINDINGS: Orogastric tube passes well  below the diaphragm into the distal stomach, projecting in the right mid abdomen.  There is a paucity of bowel gas.  Vascular calcifications are noted.  Lung bases irregular interstitial airspace opacities.  IMPRESSION: Orogastric tube tip projects in the distal stomach.   Electronically Signed   By: Lajean Manes M.D.   On: 11/10/2014 20:29   Dg Chest Port 1 View  11/10/2014   CLINICAL DATA:  Intubated  EXAM: PORTABLE CHEST - 1 VIEW  COMPARISON:  11/10/2014  FINDINGS: Cardiomediastinal silhouette is stable. Bilateral airspace opacities are again noted without change in aeration. Endotracheal tube in place with tip 2.8 cm above the carina. NG tube in place with tip in proximal stomach. There is no pneumothorax. There is right IJ central line with tip in SVC right atrium junction.  IMPRESSION: Endotracheal and NG tube in place. Again noted bilateral airspace opacities without change in aeration. No pneumothorax.   Electronically Signed   By: Lahoma Crocker M.D.   On: 11/10/2014 20:20   Dg Chest Port 1 View  11/10/2014   CLINICAL DATA:  Evaluate pneumonia.  EXAM: PORTABLE CHEST - 1 VIEW  COMPARISON:  11/09/2014  FINDINGS: The normal heart size. Aortic atherosclerosis identified. Persistent bilateral pulmonary opacities identified. There is slightly improved aeration to the right upper lobe with worsening aeration to the right lower lobe.  IMPRESSION: 1. Persistent bilateral airspace opacities with slightly improved aeration to the right upper lobe.   Electronically Signed   By: Kerby Moors M.D.   On: 11/10/2014 09:31     Medications:   . fentaNYL infusion INTRAVENOUS 50 mcg/hr (11/11/14 0727)  . furosemide (LASIX) infusion 10 mg/hr (11/11/14 0600)  . insulin (NOVOLIN-R) infusion 12.7 Units/hr (11/11/14 0832)  . norepinephrine (LEVOPHED) Adult infusion Stopped (11/11/14 0800)   . sodium chloride   Intravenous Once  . sodium chloride   Intravenous Once  . antiseptic oral rinse  7 mL Mouth Rinse QID  .  aspirin EC  81 mg Oral Daily  . atorvastatin  10 mg Oral QHS  . azithromycin  500 mg Intravenous Q24H  . budesonide (PULMICORT) nebulizer solution  0.25 mg Nebulization BID  . cefTRIAXone (ROCEPHIN)  IV  1 g Intravenous Q24H  . chlorhexidine gluconate  15 mL Mouth Rinse BID  . citalopram  20 mg Oral QPM  . donepezil  5 mg Oral Daily  . famotidine (PEPCID) IV  20 mg Intravenous Q24H  .  Gerhardt's butt cream   Topical QID  . guaiFENesin  600 mg Oral BID  . hydrALAZINE  50 mg Oral TID  . ipratropium-albuterol  3 mL Nebulization Q4H  . isosorbide mononitrate  90 mg Oral BID  . Liraglutide  1.8 mg Subcutaneous Daily  . methylPREDNISolone (SOLU-MEDROL) injection  20 mg Intravenous Q12H  . pneumococcal 23 valent vaccine  0.5 mL Intramuscular Tomorrow-1000  . psyllium  1 packet Oral Daily  . saccharomyces boulardii  250 mg Oral BID  . sodium chloride  10-40 mL Intracatheter Q12H  . sotalol  120 mg Oral BID  . vitamin B-12  1,000 mcg Oral Daily   acetaminophen **OR** acetaminophen, benzonatate, guaiFENesin-dextromethorphan, hydrALAZINE, LORazepam, midazolam, ondansetron **OR** ondansetron (ZOFRAN) IV, sodium chloride  Assessment/ Plan:  69 y.o. female with diabetic kidney disease, hypertension, diabetic retinopathy, bilateral cataracts, history of TIA and chronic kidney disease stage V is admitted for pneumonia  1. Acute renal failure on chronic kidney disease stage V - Likely ATN due to concurrent illness. Recent worsening due to hypotension yesterday Serum creatinine slightly worse today Electrolytes and Volume status are acceptable No acute indication for Dialysis at present   2. Acute respiratory failure secondary to bilateral pneumonia -  Vent support at present - IV Lasix infusion  3. Hypertension  - Avoid Ace inhibitor or ARB at this time due to advanced renal failure and acute renal failure - Sotalol, hydralazine, Imdur, furosemide  4. Hyperkalemia, now hypokalemia -  corrected  5. Diabetes type 2 with CKD - Difficult to achieve better blood sugar control likely due to underlying infection   LOS: 4 SINGH,HARMEET 9/17/20169:11 AM

## 2014-11-11 NOTE — Progress Notes (Signed)
E-link physician Dr. Jimmy Footman has entered orders for pt to receive one unit RBCs.

## 2014-11-11 NOTE — Progress Notes (Signed)
[facility] Progress Note Patient Name: Felicia Vincent DOB: [birth year] MRN: [id number]   Date of Service  11/11/2014  HPI/Events of Note  Hgb drop from 8.5 to 6.5 on subq heparin in the setting diuresis net negative.  eICU Interventions  Plan: Transfuse 1 unit pRBC Post-transfusion CBC D/C heparin On SCDs     Intervention Category Intermediate Interventions: Bleeding - evaluation and treatment with blood products  DETERDING,ELIZABETH 11/11/2014, 6:41 AM

## 2014-11-11 NOTE — Progress Notes (Signed)
Pt was intubated at [place] last night due to increased work of breathing, assessory muscle use.  Pt on PRVC mode:25,500,50%,5 peep.  Pt is sedated with 50 mcg fentanyl, pt will arouse and follow commands.  NSR, lungs coarse.  Flexiseal and foley remain in place.  Pt on 2 mcg levophed to maintain map 65.  Pt on insulin drip and lasix drip.

## 2014-11-11 NOTE — Progress Notes (Signed)
PULMONARY / CRITICAL CARE MEDICINE   Name: Felicia Vincent MRN: [id number] DOB: 10/27/1945    ADMISSION DATE:  11/07/2014  INITIAL PRESENTATION:  5 F initially seen as outpt one week PTA with complaints of weakness, dyspnea, cough and treated for PNA as outpt with levofloxacin. Was intolerant of this medication due to nausea and was changed to amoxicillin 4 days PTA. Seen in ED 9/13 with worsening dyspnea and admitted with dx of acute hypoxic resp failure due to CAP  MAJOR EVENTS/TEST RESULTS: 9/13 Admitted with above history. Transferred one unit PRBCs for Hgb 6.8.  9/14 Nephrology consultation 9/14 GI consultation. No indication for endoscopy 9/14 PCCM consultation for worsening hypoxic failure. CXR deemed c/w CAP 9/15 Pt improved with less dyspnea. Cr improving 9/16 Worsening hypoxemia. CXR c/w edema. Furosemide gtt initiated by Renal service. Cycling between BiPAP and high flow O2. Intermittently agitated. Low dose lorazepam ordered 9/16 worsening resp status-intubated  INDWELLING DEVICES:: RT IJ CVL>>9/16   MICRO DATA: MRSA PCR 9/13 >> NEG C diff PCR 9/14 >> NEG Blood 9/13 >>   ANTIMICROBIALS:  Pip-tazo 9/13 >> 9/14 Vanc 9/13 >> 9/16  Azithromycin 9/13 >>  Ceftriaxone 9/14 >>    SUBJECTIVE:  More dyspneic. Intermittently agitated. Does not tolerate BiPAP well  VITAL SIGNS: Temp:  [96.2 F (35.7 C)-98.5 F (36.9 C)] 97.2 F (36.2 C) (09/17 0700) Pulse Rate:  [64-98] 64 (09/17 0600) Resp:  [15-39] 25 (09/17 0600) BP: (94-184)/(38-116) 121/48 mmHg (09/17 0600) SpO2:  [91 %-100 %] 99 % (09/17 0600) FiO2 (%):  [50 %-100 %] 50 % (09/17 0809) HEMODYNAMICS:   VENTILATOR SETTINGS: Vent Mode:  [-] PRVC FiO2 (%):  [50 %-100 %] 50 % Set Rate:  [16 bmp-25 bmp] 25 bmp Vt Set:  [450 mL-500 mL] 500 mL PEEP:  [5 cmH20] 5 cmH20 INTAKE / OUTPUT:  Intake/Output Summary (Last 24 hours) at 11/11/14 4098 Last data filed at 11/11/14 0206  Gross per 24 hour  Intake     50 ml   Output    825 ml  Net   -775 ml    Review of Systems  Unable to perform ROS: critical illness     PHYSICAL EXAMINATION: Physical Exam  Constitutional: No distress.  HENT:  Head: Normocephalic and atraumatic.  Eyes: Pupils are equal, round, and reactive to light. No scleral icterus.  Neck: Normal range of motion. Neck supple.  Cardiovascular: Normal rate and regular rhythm.   No murmur heard. Pulmonary/Chest: No respiratory distress. She has no wheezes. She has rales.  resp distress  Abdominal: Soft. She exhibits no distension. There is no tenderness.  Musculoskeletal: She exhibits no edema.  Neurological: She displays normal reflexes. Coordination normal.  gcs<8T  Skin: Skin is warm. No rash noted. She is not diaphoretic.    LABS:  CBC  Recent Labs Lab 11/09/14 0433 11/10/14 0511 11/11/14 0543  WBC 22.1* 27.5* 17.7*  HGB 8.2* 8.5* 6.4*  HCT 24.0* 23.8* 19.3*  PLT 351 372 335   Coag's No results for input(s): APTT, INR in the last 168 hours. BMET  Recent Labs Lab 11/10/14 1302 11/10/14 1727 11/11/14 0439  NA 138 137 137  K 4.2 4.7 3.6  CL 106 105 105  CO2 25 21* 21*  BUN 41* 45* 61*  CREATININE 2.75* 2.79* 3.21*  GLUCOSE 205* 336* 349*   Electrolytes  Recent Labs Lab 11/10/14 1302 11/10/14 1727 11/11/14 0439  CALCIUM 8.3* 8.5* 8.1*  MG 1.6*  --   --  Sepsis Markers  Recent Labs Lab 11/07/14 1248 11/07/14 1802  LATICACIDVEN 1.0 1.5   ABG  Recent Labs Lab 11/10/14 1655 11/10/14 2220 11/11/14 0121  PHART 7.32* 7.23* 7.30*  PCO2ART 45 58* 36  PO2ART 69* 77* 178*   Liver Enzymes  Recent Labs Lab 11/07/14 1230  AST 12*  ALT 10*  ALKPHOS 78  BILITOT 0.9  ALBUMIN 2.4*   Cardiac Enzymes  Recent Labs Lab 11/07/14 1230  TROPONINI <0.03   Glucose  Recent Labs Lab 11/11/14 0239 11/11/14 0355 11/11/14 0503 11/11/14 0612 11/11/14 0707 11/11/14 0813  GLUCAP 375* 347* 300* 260* 248* 219*    CXR: edema  pattern  ASSESSMENT / PLAN: 69 yo white female with acute and progressive hypoxic resp failure  From pneumonia with progressive renal failure  PULMONARY A: -Respiratory Failure -continue Full MV support -continue Bronchodilator Therapy -Wean Fio2 and PEEP as tolerated -will perform SAT/SBt when respiratory parameters are met  CARDIOVASCULAR -continue icu monitoring  RENAL A:  AKI, nonoliguric  Monitor BMET intermittently Monitor I/Os Correct electrolytes as indicated Avoid nephrotoxins Renal Service following - furosemide infusion ordered 9/16  GASTROINTESTINAL -OG in place, start TF's  HEMATOLOGIC A:   Anemia, unclear etiology. No overt bleeding presently P:  DVT px: SCDs Monitor CBC intermittently Transfuse per usual guidelines  INFECTIOUS A:   CAP, NOS P:   Monitor temp, WBC count Micro and abx as above  ENDOCRINE A:  DM2, improved control Episode of hypoglycemia 9/15 insulin drip if needed  NEUROLOGIC GCS<8T -- intubated and sedated - minimal sedation to achieve a RASS goal: -1  Remains critically ill, continue ICU monitoring  I have personally obtained a history, examined the patient, evaluated Pertinent laboratory and RadioGraphic/imaging results, and  formulated the assessment and plan   The Patient requires high complexity decision making for assessment and support, frequent evaluation and titration of therapies, application of advanced monitoring technologies and extensive interpretation of multiple databases. Critical Care Time devoted to patient care services described in this note is 40 minutes.   Overall, patient is critically ill, prognosis is guarded.  Patient with Multiorgan failure and at high risk for cardiac arrest and death.    Corrin Parker, M.D.  [facility]  Medical Director [place] Director [facility] Cardio-Pulmonary Department

## 2014-11-12 LAB — GLUCOSE, CAPILLARY
GLUCOSE-CAPILLARY: 105 mg/dL — AB (ref 65–99)
GLUCOSE-CAPILLARY: 108 mg/dL — AB (ref 65–99)
GLUCOSE-CAPILLARY: 140 mg/dL — AB (ref 65–99)
GLUCOSE-CAPILLARY: 141 mg/dL — AB (ref 65–99)
GLUCOSE-CAPILLARY: 143 mg/dL — AB (ref 65–99)
GLUCOSE-CAPILLARY: 148 mg/dL — AB (ref 65–99)
GLUCOSE-CAPILLARY: 149 mg/dL — AB (ref 65–99)
GLUCOSE-CAPILLARY: 157 mg/dL — AB (ref 65–99)
GLUCOSE-CAPILLARY: 164 mg/dL — AB (ref 65–99)
GLUCOSE-CAPILLARY: 165 mg/dL — AB (ref 65–99)
GLUCOSE-CAPILLARY: 175 mg/dL — AB (ref 65–99)
GLUCOSE-CAPILLARY: 177 mg/dL — AB (ref 65–99)
GLUCOSE-CAPILLARY: 179 mg/dL — AB (ref 65–99)
GLUCOSE-CAPILLARY: 221 mg/dL — AB (ref 65–99)
Glucose-Capillary: 151 mg/dL — ABNORMAL HIGH (ref 65–99)
Glucose-Capillary: 160 mg/dL — ABNORMAL HIGH (ref 65–99)
Glucose-Capillary: 163 mg/dL — ABNORMAL HIGH (ref 65–99)
Glucose-Capillary: 164 mg/dL — ABNORMAL HIGH (ref 65–99)
Glucose-Capillary: 167 mg/dL — ABNORMAL HIGH (ref 65–99)
Glucose-Capillary: 169 mg/dL — ABNORMAL HIGH (ref 65–99)
Glucose-Capillary: 170 mg/dL — ABNORMAL HIGH (ref 65–99)
Glucose-Capillary: 184 mg/dL — ABNORMAL HIGH (ref 65–99)
Glucose-Capillary: 194 mg/dL — ABNORMAL HIGH (ref 65–99)
Glucose-Capillary: 197 mg/dL — ABNORMAL HIGH (ref 65–99)
Glucose-Capillary: 222 mg/dL — ABNORMAL HIGH (ref 65–99)

## 2014-11-12 LAB — CULTURE, BLOOD (ROUTINE X 2)
CULTURE: NO GROWTH
CULTURE: NO GROWTH

## 2014-11-12 LAB — BASIC METABOLIC PANEL
Anion gap: 11 (ref 5–15)
BUN: 82 mg/dL — AB (ref 6–20)
CALCIUM: 8.8 mg/dL — AB (ref 8.9–10.3)
CO2: 23 mmol/L (ref 22–32)
CREATININE: 3.62 mg/dL — AB (ref 0.44–1.00)
Chloride: 105 mmol/L (ref 101–111)
GFR, EST AFRICAN AMERICAN: 14 mL/min — AB (ref >60)
GFR, EST NON AFRICAN AMERICAN: 12 mL/min — AB (ref >60)
Glucose, Bld: 148 mg/dL — ABNORMAL HIGH (ref 65–99)
Potassium: 3.4 mmol/L — ABNORMAL LOW (ref 3.5–5.1)
SODIUM: 139 mmol/L (ref 135–145)

## 2014-11-12 LAB — HEMOGLOBIN: HEMOGLOBIN: 7.5 g/dL — AB (ref 12.0–16.0)

## 2014-11-12 LAB — ALBUMIN: ALBUMIN: 1.7 g/dL — AB (ref 3.5–5.0)

## 2014-11-12 LAB — PHOSPHORUS: PHOSPHORUS: 3.8 mg/dL (ref 2.5–4.6)

## 2014-11-12 MED ORDER — INSULIN GLARGINE 100 UNIT/ML ~~LOC~~ SOLN
40.0000 [IU] | Freq: Every day | SUBCUTANEOUS | Status: DC
  Administered 2014-11-12: 40 [IU] via SUBCUTANEOUS
  Filled 2014-11-12 (×2): qty 0.4

## 2014-11-12 MED ORDER — PANTOPRAZOLE SODIUM 40 MG PO PACK
40.0000 mg | PACK | Freq: Every day | ORAL | Status: DC
  Administered 2014-11-12 – 2014-11-13 (×2): 40 mg
  Filled 2014-11-12 (×2): qty 20

## 2014-11-12 NOTE — Progress Notes (Signed)
PULMONARY / CRITICAL CARE MEDICINE   Name: Felicia Vincent MRN: [id number] DOB: 1945/06/29    ADMISSION DATE:  11/07/2014  INITIAL PRESENTATION:  65 F initially seen as outpt one week PTA with complaints of weakness, dyspnea, cough and treated for PNA as outpt with levofloxacin. Was intolerant of this medication due to nausea and was changed to amoxicillin 4 days PTA. Seen in ED 9/13 with worsening dyspnea and admitted with dx of acute hypoxic resp failure due to CAP  MAJOR EVENTS/TEST RESULTS: 9/13 Admitted with above history. Transferred one unit PRBCs for Hgb 6.8.  9/14 Nephrology consultation 9/14 GI consultation. No indication for endoscopy 9/14 PCCM consultation for worsening hypoxic failure. CXR deemed c/w CAP 9/15 Pt improved with less dyspnea. Cr improving 9/16 Worsening hypoxemia. CXR c/w edema. Furosemide gtt initiated by Renal service. Cycling between BiPAP and high flow O2. Intermittently agitated. Low dose lorazepam ordered 9/16 worsening resp status-intubated,sedated 9/17-9/18 wean fio2 as tolerated  INDWELLING DEVICES:: RT IJ CVL>>9/16   MICRO DATA: MRSA PCR 9/13 >> NEG C diff PCR 9/14 >> NEG Blood 9/13 >>   ANTIMICROBIALS:  Pip-tazo 9/13 >> 9/14 Vanc 9/13 >> 9/16  Azithromycin 9/13 >>  Ceftriaxone 9/14 >>    SUBJECTIVE:  Remains critically ill, remains on vent, Continue TF's Plan for SAT/SBT in next 24hrs, wean fio2 as tolerated  VITAL SIGNS: Temp:  [97.1 F (36.2 C)-98 F (36.7 C)] 97.7 F (36.5 C) (09/18 0730) Pulse Rate:  [60-101] 67 (09/18 0809) Resp:  [15-26] 25 (09/18 0809) BP: (108-152)/(43-59) 152/59 mmHg (09/18 0809) SpO2:  [88 %-100 %] 99 % (09/18 0809) FiO2 (%):  [40 %-50 %] 40 % (09/18 0809) Weight:  [202 lb 13.2 oz (92 kg)-205 lb 11 oz (93.3 kg)] 205 lb 11 oz (93.3 kg) (09/18 0500) HEMODYNAMICS:   VENTILATOR SETTINGS: Vent Mode:  [-] PRVC FiO2 (%):  [40 %-50 %] 40 % Set Rate:  [25 bmp] 25 bmp Vt Set:  [500 mL] 500 mL PEEP:  [5  cmH20] 5 cmH20 Plateau Pressure:  [24 cmH20] 24 cmH20 INTAKE / OUTPUT:  Intake/Output Summary (Last 24 hours) at 11/12/14 2774 Last data filed at 11/12/14 0730  Gross per 24 hour  Intake 2040.99 ml  Output    695 ml  Net 1345.99 ml    Review of Systems  Unable to perform ROS: critical illness     PHYSICAL EXAMINATION: Physical Exam  Constitutional: No distress.  HENT:  Head: Normocephalic and atraumatic.  Eyes: Pupils are equal, round, and reactive to light. No scleral icterus.  Neck: Normal range of motion. Neck supple.  Cardiovascular: Normal rate and regular rhythm.   No murmur heard. Pulmonary/Chest: No respiratory distress. She has no wheezes. She has rales.  resp distress  Abdominal: Soft. She exhibits no distension. There is no tenderness.  Musculoskeletal: She exhibits no edema.  Neurological: She displays normal reflexes. Coordination normal.  gcs<8T  Skin: Skin is warm. No rash noted. She is not diaphoretic.    LABS:  CBC  Recent Labs Lab 11/09/14 0433 11/10/14 0511 11/11/14 0543 11/11/14 0854 11/12/14 0614  WBC 22.1* 27.5* 17.7*  --   --   HGB 8.2* 8.5* 6.4* 6.5* 7.5*  HCT 24.0* 23.8* 19.3*  --   --   PLT 351 372 335  --   --    Coag's No results for input(s): APTT, INR in the last 168 hours. BMET  Recent Labs Lab 11/10/14 1302 11/10/14 1727 11/11/14 0439  NA 138 137 137  K 4.2 4.7 3.6  CL 106 105 105  CO2 25 21* 21*  BUN 41* 45* 61*  CREATININE 2.75* 2.79* 3.21*  GLUCOSE 205* 336* 349*   Electrolytes  Recent Labs Lab 11/10/14 1302 11/10/14 1727 11/11/14 0439  CALCIUM 8.3* 8.5* 8.1*  MG 1.6*  --   --   PHOS  --   --  3.8   Sepsis Markers  Recent Labs Lab 11/07/14 1248 11/07/14 1802  LATICACIDVEN 1.0 1.5   ABG  Recent Labs Lab 11/10/14 2220 11/11/14 0121 11/11/14 0830  PHART 7.23* 7.30* 7.36  PCO2ART 58* 36 36  PO2ART 77* 178* 50*   Liver Enzymes  Recent Labs Lab 11/07/14 1230 11/11/14 0439  AST 12*  --    ALT 10*  --   ALKPHOS 78  --   BILITOT 0.9  --   ALBUMIN 2.4* 1.7*   Cardiac Enzymes  Recent Labs Lab 11/07/14 1230  TROPONINI <0.03   Glucose  Recent Labs Lab 11/11/14 2228 11/11/14 2328 11/12/14 0029 11/12/14 0134 11/12/14 0228 11/12/14 0331  GLUCAP 179* 200* 197* 169* 170* 143*    CXR: edema pattern  ASSESSMENT / PLAN: 69 yo white female with acute and progressive hypoxic resp failure  From pneumonia with progressive renal failure  PULMONARY A: -Respiratory Failure -continue Full MV support -continue Bronchodilator Therapy -Wean Fio2 and PEEP as tolerated -will perform SAT/SBt when respiratory parameters are met-will attempt SAT/SBT in next 24 hrs  CARDIOVASCULAR -continue icu monitoring  RENAL A:  AKI, nonoliguric  Monitor BMET intermittently Monitor I/Os Correct electrolytes as indicated Avoid nephrotoxins Renal Service following - furosemide infusion ordered 9/16  GASTROINTESTINAL -OG in place, started TF's  HEMATOLOGIC A:   Anemia, unclear etiology. No overt bleeding presently P:  DVT px: SCDs Monitor CBC intermittently Transfuse per usual guidelines  INFECTIOUS Pneumonia -continue abx as prescribed  ENDOCRINE A:  DM2, improved control Episode of hypoglycemia 9/15 insulin drip  NEUROLOGIC GCS<8T - intubated and sedated - minimal sedation to achieve a RASS goal: -1  Remains critically ill, continue ICU monitoring  I have personally obtained a history, examined the patient, evaluated Pertinent laboratory and RadioGraphic/imaging results, and  formulated the assessment and plan   The Patient requires high complexity decision making for assessment and support, frequent evaluation and titration of therapies, application of advanced monitoring technologies and extensive interpretation of multiple databases. Critical Care Time devoted to patient care services described in this note is 40 minutes.   Overall, patient is critically  ill, prognosis is guarded.  Patient with Multiorgan failure and at high risk for cardiac arrest and death.    Corrin Parker, M.D.  [facility]  Medical Director [place] Director [facility] Cardio-Pulmonary Department

## 2014-11-12 NOTE — Progress Notes (Signed)
E-link MD Melvyn Novas) contacted for Lantus orders as Pt transitioning from phase 2 to phase 3 of ICU glycemic control protocol.  New orders entered by Dr Melvyn Novas.

## 2014-11-12 NOTE — Progress Notes (Signed)
eLink Physician-Brief Progress Note Patient Name: GRACIELA PLATO DOB: [birth year] MRN: [id number]   Date of Service  11/12/2014  HPI/Events of Note  Attempting to wean off insulin drip while on tube feeds and had been getting 35 units of levmir q pm at hom  eICU Interventions  Try lantus 40 units qpm and contiue to wean the insulin drip per protocol     Intervention Category Major Interventions: Hyperglycemia - active titration of insulin therapy  Christinia Gully 11/12/2014, 10:34 PM

## 2014-11-12 NOTE — Progress Notes (Signed)
Nutrition Follow-up     INTERVENTION:  EN: Continue vital high protein at goal rate of 55ml/hr with prostat q daily to meet nutritional needs   NUTRITION DIAGNOSIS:   Inadequate oral intake related to acute illness as evidenced by NPO status,being addressed with tube feeding    GOAL:   Patient will meet greater than or equal to 90% of their needs    MONITOR:    (Energy intake, Pulmonary profile, Electrolyte and renal profile)  REASON FOR ASSESSMENT:   Ventilator    ASSESSMENT:      Pt remains on vent  Current Nutrition: Tolerating vital high protein at goal rate   Gastrointestinal Profile: noted abdomen soft, bowel sounds active Last BM: rectal tube in place noted 134ml output   Medications: reviewed  Electrolyte/Renal Profile and Glucose Profile:   Recent Labs Lab 11/10/14 1302 11/10/14 1727 11/11/14 0439 11/12/14 0856  NA 138 137 137 139  K 4.2 4.7 3.6 3.4*  CL 106 105 105 105  CO2 25 21* 21* 23  BUN 41* 45* 61* 82*  CREATININE 2.75* 2.79* 3.21* 3.62*  CALCIUM 8.3* 8.5* 8.1* 8.8*  MG 1.6*  --   --   --   PHOS  --   --  3.8  --   GLUCOSE 205* 336* 349* 148*   Protein Profile:  Recent Labs Lab 11/07/14 1230 11/11/14 0439  ALBUMIN 2.4* 1.7*      Weight Trend since Admission: Filed Weights   11/07/14 1725 11/11/14 1300 11/12/14 0500  Weight: 194 lb 0.1 oz (88 kg) 202 lb 13.2 oz (92 kg) 205 lb 11 oz (93.3 kg)      Diet Order:  Diet NPO time specified  Skin:   reviewed      Height:   Ht Readings from Last 1 Encounters:  11/07/14 5\' 1"  (1.549 m)    Weight:   Wt Readings from Last 1 Encounters:  11/12/14 205 lb 11 oz (93.3 kg)    Ideal Body Weight:     BMI:  Body mass index is 38.88 kg/(m^2).  Estimated Nutritional Needs:   Kcal:  (11-14 kcals/kg) Using ABW of 88kg.  282-0601 kcals/d  Protein:  (2.0-2.5 g/kg) Using IBW of 48 kg 96-120 g/d  Fluid:  (25-42ml/kg) 1200-1447ml/d  EDUCATION NEEDS:   No education  needs identified at this time  HIGH Care Level  Joli B. Zenia Resides, [place], [place] (pager)

## 2014-11-12 NOTE — Progress Notes (Signed)
[facility] [place] NAME: Felicia Vincent    MR#:  [id number]  DATE OF BIRTH:  1945/09/29  SUBJECTIVE:  Got inutbated due to impending respiratory failure day 3  REVIEW OF SYSTEMS:   Review of Systems  Unable to perform ROS: intubated   DRUG ALLERGIES:   Allergies  Allergen Reactions  . Codeine Nausea Only  . Prednisone Other (See Comments)    Pts daughter states that this medication "makes her evil".      VITALS:  Blood pressure 149/54, pulse 71, temperature 97.9 F (36.6 C), temperature source Oral, resp. rate 25, height 5\' 1"  (1.549 m), weight 93.3 kg (205 lb 11 oz), SpO2 99 %.  PHYSICAL EXAMINATION:   Physical Exam  GENERAL:  69 y.o.-year-old patient lying in the bed with no acute distress. critically ill EYES: Pupils equal, round, reactive to light and accommodation. No scleral icterus. Extraocular muscles intact.  HEENT: Head atraumatic, normocephalic. Oropharynx and nasopharynx clear. Intubated and on the vent NECK:  Supple, no jugular venous distention. No thyroid enlargement, no tenderness.  LUNGS: Normal breath sounds bilaterally, no wheezing, rales, rhonchi. No use of accessory muscles of respiration.  CARDIOVASCULAR: S1, S2 normal. No murmurs, rubs, or gallops.  ABDOMEN: Soft, nontender, nondistended. Bowel sounds present. No organomegaly or mass.  EXTREMITIES: No cyanosis, clubbing or edema b/l.    NEUROLOGIC: unable to assess, on the vent PSYCHIATRIC: unable to assess, on the vent SKIN: No obvious rash, lesion, or ulcer.   LABORATORY PANEL:   CBC  Recent Labs Lab 11/11/14 0543  11/12/14 0614  WBC 17.7*  --   --   HGB 6.4*  < > 7.5*  HCT 19.3*  --   --   PLT 335  --   --   < > = values in this interval not displayed.  Chemistries   Recent Labs Lab 11/07/14 1230  11/10/14 1302  11/12/14 0856  NA 126*  < > 138  < > 139  K 5.6*  < > 4.2  < > 3.4*  CL 93*  < > 106  < > 105  CO2 23  < > 25  < >  23  GLUCOSE 575*  < > 205*  < > 148*  BUN 71*  < > 41*  < > 82*  CREATININE 4.63*  < > 2.75*  < > 3.62*  CALCIUM 7.9*  < > 8.3*  < > 8.8*  MG  --   --  1.6*  --   --   AST 12*  --   --   --   --   ALT 10*  --   --   --   --   ALKPHOS 78  --   --   --   --   BILITOT 0.9  --   --   --   --   < > = values in this interval not displayed.  Cardiac Enzymes  Recent Labs Lab 11/07/14 1230  TROPONINI <0.03    RADIOLOGY:  Dg Abd 1 View  11/10/2014   CLINICAL DATA:  Encounter for intubation Z01.818 (ICD-10-CM) Encounter for orogastric (OG) tube placement Z46.59 (ICD-10-CM)  EXAM: ABDOMEN - 1 VIEW  COMPARISON:  None.  FINDINGS: Orogastric tube passes well below the diaphragm into the distal stomach, projecting in the right mid abdomen.  There is a paucity of bowel gas.  Vascular calcifications are noted.  Lung bases irregular interstitial airspace opacities.  IMPRESSION: Orogastric tube tip projects in the distal stomach.   Electronically Signed   By: Lajean Manes M.D.   On: 11/10/2014 20:29   Dg Chest Port 1 View  11/11/2014   CLINICAL DATA:  Respiratory failure, ventilatory support  EXAM: PORTABLE CHEST - 1 VIEW  COMPARISON:  11/10/2014  FINDINGS: Endotracheal tube 1.6 cm above the carina. NG tube extends into the stomach with the tip not visualized. Right IJ central line tip proximal right atrium as before. Severe patchy bilateral airspace process throughout both lungs with some sparing of the left upper lobe compatible with pneumonia versus alveolar edema or hemorrhage. No enlarging effusion or pneumothorax. No significant interval change.  IMPRESSION: Stable support apparatus  Persistent asymmetric bilateral airspace disease compatible with pneumonia. Little interval change.   Electronically Signed   By: Jerilynn Mages.  Shick M.D.   On: 11/11/2014 09:31   Dg Chest Port 1 View  11/10/2014   CLINICAL DATA:  Intubated  EXAM: PORTABLE CHEST - 1 VIEW  COMPARISON:  11/10/2014  FINDINGS: Cardiomediastinal  silhouette is stable. Bilateral airspace opacities are again noted without change in aeration. Endotracheal tube in place with tip 2.8 cm above the carina. NG tube in place with tip in proximal stomach. There is no pneumothorax. There is right IJ central line with tip in SVC right atrium junction.  IMPRESSION: Endotracheal and NG tube in place. Again noted bilateral airspace opacities without change in aeration. No pneumothorax.   Electronically Signed   By: Lahoma Crocker M.D.   On: 11/10/2014 20:20     ASSESSMENT AND PLAN:   69 year old with bilateral pneumonia  1.  acute hypoxic respiratory failure secondary to bilateral pneumonia: and now with possible CHF acute systolic/diastolic continue with  Ceftriaxone, azithromycin Chest x-rays with persistent bilateral infiltrates now concern for CHF Started on IV Lasix therapy appreciate cardiology and nephrology input -uop picking up some Rising creatinine  2. Acute renal failure on chronic renal failure due to dehydration: Baseline seems to be at 3.0 On IV lasix gtt  avoid nephrotoxins Received Kayexalate for hyperkalemia  3. Hypertension continue imdur and hydralazine   4. Diabetes type 2 with hyperglycemia  -now on insulin gtt  5. History of CVA continue aspirin  6. Diarrhea C. difficile toxin is negative  6. Miscellaneous heparin for DVT prophylaxis  Case discussed with Care Management/Social Worker.  CODE STATUS: full  DVT Prophylaxis: heparin  TOTAL critical TIME TAKING CARE OF THIS PATIENT: 61minutes.  >50% time spent on counselling and coordination of care Dr Mickel Duhamel Nadeen Landau M.D on 11/12/2014 at 1:57 PM  Between 7am to 6pm - Pager - [phone]  After 6pm go to [email or web address] - password [email or web address]  Office  [phone]  CC: Primary care physician; Lavera Guise, MD

## 2014-11-12 NOTE — Progress Notes (Signed)
Subjective:   Patient is critically ill Was intubated 9/16 due to increased work of breathing S cr worsened to 3.62 . Urine output seems to be picking up this morning Patient's daughter at bedside   Objective:  Vital signs in last 24 hours:  Temp:  [97.1 F (36.2 C)-98 F (36.7 C)] 97.7 F (36.5 C) (09/18 0730) Pulse Rate:  [60-101] 71 (09/18 0900) Resp:  [15-25] 25 (09/18 0900) BP: (108-152)/(43-59) 139/55 mmHg (09/18 0900) SpO2:  [88 %-100 %] 97 % (09/18 0900) FiO2 (%):  [40 %-50 %] 40 % (09/18 0809) Weight:  [92 kg (202 lb 13.2 oz)-93.3 kg (205 lb 11 oz)] 93.3 kg (205 lb 11 oz) (09/18 0500)  Weight change:  Filed Weights   11/07/14 1725 11/11/14 1300 11/12/14 0500  Weight: 88 kg (194 lb 0.1 oz) 92 kg (202 lb 13.2 oz) 93.3 kg (205 lb 11 oz)    Intake/Output:    Intake/Output Summary (Last 24 hours) at 11/12/14 0938 Last data filed at 11/12/14 0730  Gross per 24 hour  Intake 2040.99 ml  Output    695 ml  Net 1345.99 ml     Physical Exam: General: frail, elderly, chronically ill-appearing, laying in the bed   HEENT ETT in place  Neck supple  Pulm/lungs bilateral basilar crackles- improved,  ventilator assisted   CVS/Heart irregular rhythm, no rub or gallop  Abdomen:  soft, nontender   Extremities: ++ peripheral edema   Neurologic: sedated  Skin: No acute rashes    Foley present        Basic Metabolic Panel:   Recent Labs Lab 11/10/14 0511 11/10/14 1302 11/10/14 1727 11/11/14 0439 11/12/14 0856  NA 138 138 137 137 139  K 2.9* 4.2 4.7 3.6 3.4*  CL 105 106 105 105 105  CO2 26 25 21* 21* 23  GLUCOSE 139* 205* 336* 349* 148*  BUN 40* 41* 45* 61* 82*  CREATININE 2.76* 2.75* 2.79* 3.21* 3.62*  CALCIUM 8.1* 8.3* 8.5* 8.1* 8.8*  MG  --  1.6*  --   --   --   PHOS  --   --   --  3.8  --      CBC:  Recent Labs Lab 11/07/14 1230 11/07/14 1410 11/08/14 0554 11/09/14 0433 11/10/14 0511 11/11/14 0543 11/11/14 0854 11/12/14 0614  WBC 11.8*  11.2* 14.5* 22.1* 27.5* 17.7*  --   --   NEUTROABS 10.2*  --   --   --   --   --   --   --   HGB 7.2* 6.8* 8.5* 8.2* 8.5* 6.4* 6.5* 7.5*  HCT 21.2* 20.8* 23.9* 24.0* 23.8* 19.3*  --   --   MCV 88.8 87.1 93.5 92.2 98.3 86.9  --   --   PLT 242 294 280 351 372 335  --   --       Microbiology:  Recent Results (from the past 720 hour(s))  Culture, blood (routine x 2)     Status: None (Preliminary result)   Collection Time: 11/07/14 12:30 PM  Result Value Ref Range Status   Specimen Description BLOOD RIGHT ASSIST CONTROL  Final   Special Requests   Final    BOTTLES DRAWN AEROBIC AND ANAEROBIC  2CC AERO 1 CC ANAERO   Culture NO GROWTH 4 DAYS  Final   Report Status PENDING  Incomplete  Culture, blood (routine x 2)     Status: None (Preliminary result)   Collection Time: 11/07/14 12:48 PM  Result  Value Ref Range Status   Specimen Description BLOOD LEFT FATTY CASTS  Final   Special Requests BOTTLES DRAWN AEROBIC AND ANAEROBIC  1CC  Final   Culture NO GROWTH 4 DAYS  Final   Report Status PENDING  Incomplete  MRSA PCR Screening     Status: None   Collection Time: 11/07/14  6:06 PM  Result Value Ref Range Status   MRSA by PCR NEGATIVE NEGATIVE Final    Comment:        The GeneXpert MRSA Assay (FDA approved for NASAL specimens only), is one component of a comprehensive MRSA colonization surveillance program. It is not intended to diagnose MRSA infection nor to guide or monitor treatment for MRSA infections.   C difficile quick scan w PCR reflex     Status: None   Collection Time: 11/08/14  4:15 AM  Result Value Ref Range Status   C Diff antigen NEGATIVE NEGATIVE Final   C Diff toxin NEGATIVE NEGATIVE Final   C Diff interpretation Negative for C. difficile  Final    Coagulation Studies: No results for input(s): LABPROT, INR in the last 72 hours.  Urinalysis: No results for input(s): COLORURINE, LABSPEC, PHURINE, GLUCOSEU, HGBUR, BILIRUBINUR, KETONESUR, PROTEINUR, UROBILINOGEN,  NITRITE, LEUKOCYTESUR in the last 72 hours.  Invalid input(s): APPERANCEUR    Imaging: Dg Abd 1 View  11/10/2014   CLINICAL DATA:  Encounter for intubation Z01.818 (ICD-10-CM) Encounter for orogastric (OG) tube placement Z46.59 (ICD-10-CM)  EXAM: ABDOMEN - 1 VIEW  COMPARISON:  None.  FINDINGS: Orogastric tube passes well below the diaphragm into the distal stomach, projecting in the right mid abdomen.  There is a paucity of bowel gas.  Vascular calcifications are noted.  Lung bases irregular interstitial airspace opacities.  IMPRESSION: Orogastric tube tip projects in the distal stomach.   Electronically Signed   By: Lajean Manes M.D.   On: 11/10/2014 20:29   Dg Chest Port 1 View  11/11/2014   CLINICAL DATA:  Respiratory failure, ventilatory support  EXAM: PORTABLE CHEST - 1 VIEW  COMPARISON:  11/10/2014  FINDINGS: Endotracheal tube 1.6 cm above the carina. NG tube extends into the stomach with the tip not visualized. Right IJ central line tip proximal right atrium as before. Severe patchy bilateral airspace process throughout both lungs with some sparing of the left upper lobe compatible with pneumonia versus alveolar edema or hemorrhage. No enlarging effusion or pneumothorax. No significant interval change.  IMPRESSION: Stable support apparatus  Persistent asymmetric bilateral airspace disease compatible with pneumonia. Little interval change.   Electronically Signed   By: Jerilynn Mages.  Shick M.D.   On: 11/11/2014 09:31   Dg Chest Port 1 View  11/10/2014   CLINICAL DATA:  Intubated  EXAM: PORTABLE CHEST - 1 VIEW  COMPARISON:  11/10/2014  FINDINGS: Cardiomediastinal silhouette is stable. Bilateral airspace opacities are again noted without change in aeration. Endotracheal tube in place with tip 2.8 cm above the carina. NG tube in place with tip in proximal stomach. There is no pneumothorax. There is right IJ central line with tip in SVC right atrium junction.  IMPRESSION: Endotracheal and NG tube in place.  Again noted bilateral airspace opacities without change in aeration. No pneumothorax.   Electronically Signed   By: Lahoma Crocker M.D.   On: 11/10/2014 20:20     Medications:   . fentaNYL infusion INTRAVENOUS 50 mcg/hr (11/12/14 0730)  . furosemide (LASIX) infusion 4 mg/hr (11/12/14 0730)  . insulin (NOVOLIN-R) infusion 7.2 mL/hr at 11/12/14 0730  .  norepinephrine (LEVOPHED) Adult infusion Stopped (11/11/14 0800)   . sodium chloride   Intravenous Once  . antiseptic oral rinse  7 mL Mouth Rinse QID  . aspirin EC  81 mg Oral Daily  . atorvastatin  10 mg Oral QHS  . azithromycin  500 mg Intravenous Q24H  . budesonide (PULMICORT) nebulizer solution  0.25 mg Nebulization BID  . cefTRIAXone (ROCEPHIN)  IV  1 g Intravenous Q24H  . chlorhexidine gluconate  15 mL Mouth Rinse BID  . citalopram  20 mg Oral QPM  . donepezil  5 mg Oral Daily  . famotidine (PEPCID) IV  20 mg Intravenous Q24H  . feeding supplement (PRO-STAT SUGAR FREE 64)  30 mL Oral Daily  . feeding supplement (VITAL HIGH PROTEIN)  1,000 mL Per Tube Q24H  . free water  25 mL Per Tube Q4H  . Gerhardt's butt cream   Topical QID  . guaiFENesin  600 mg Oral BID  . hydrALAZINE  50 mg Oral TID  . ipratropium-albuterol  3 mL Nebulization Q4H  . isosorbide mononitrate  90 mg Oral BID  . Liraglutide  1.8 mg Subcutaneous Daily  . methylPREDNISolone (SOLU-MEDROL) injection  20 mg Intravenous Q12H  . pneumococcal 23 valent vaccine  0.5 mL Intramuscular Tomorrow-1000  . psyllium  1 packet Oral Daily  . saccharomyces boulardii  250 mg Oral BID  . sodium chloride  10-40 mL Intracatheter Q12H  . sotalol  120 mg Oral BID  . vitamin B-12  1,000 mcg Oral Daily   acetaminophen **OR** acetaminophen, benzonatate, guaiFENesin-dextromethorphan, hydrALAZINE, LORazepam, midazolam, ondansetron **OR** ondansetron (ZOFRAN) IV, sodium chloride  Assessment/ Plan:  69 y.o. female with diabetic kidney disease, hypertension, diabetic retinopathy, bilateral  cataracts, history of TIA and chronic kidney disease stage V is admitted for pneumonia  1. Acute renal failure on chronic kidney disease stage V - Likely ATN due to concurrent illness. Recent worsening due to hypotension prior to intubation Serum creatinine slightly worse today Electrolytes and Volume status are acceptable No acute indication for Dialysis at present   2. Acute respiratory failure secondary to bilateral pneumonia and Pulm edema -  Vent support at present -  Continued on  low-dose IV Lasix infusion  3. Hypertension  - Avoid Ace inhibitor or ARB at this time due to advanced renal failure and acute renal failure - Sotalol, hydralazine, Imdur, furosemide  4. Hyperkalemia, now hypokalemia - Potassium supplementation when necessary  5. Diabetes type 2 with CKD     LOS: 5 Manami Tutor 9/18/20169:38 AM

## 2014-11-13 ENCOUNTER — Inpatient Hospital Stay: Payer: Medicare Other

## 2014-11-13 DIAGNOSIS — J9601 Acute respiratory failure with hypoxia: Secondary | ICD-10-CM

## 2014-11-13 DIAGNOSIS — J189 Pneumonia, unspecified organism: Principal | ICD-10-CM

## 2014-11-13 LAB — BLOOD GAS, ARTERIAL
ACID-BASE DEFICIT: 0.6 mmol/L (ref 0.0–2.0)
Allens test (pass/fail): POSITIVE — AB
Bicarbonate: 24.9 mEq/L (ref 21.0–28.0)
FIO2: 0.28
O2 SAT: 95.7 %
PCO2 ART: 43 mmHg (ref 32.0–48.0)
PEEP/CPAP: 5 cmH2O
PH ART: 7.37 (ref 7.350–7.450)
PO2 ART: 82 mmHg — AB (ref 83.0–108.0)
PRESSURE SUPPORT: 5 cmH2O
Patient temperature: 37

## 2014-11-13 LAB — GLUCOSE, CAPILLARY
GLUCOSE-CAPILLARY: 154 mg/dL — AB (ref 65–99)
GLUCOSE-CAPILLARY: 163 mg/dL — AB (ref 65–99)
GLUCOSE-CAPILLARY: 239 mg/dL — AB (ref 65–99)
GLUCOSE-CAPILLARY: 293 mg/dL — AB (ref 65–99)
GLUCOSE-CAPILLARY: 311 mg/dL — AB (ref 65–99)
Glucose-Capillary: 273 mg/dL — ABNORMAL HIGH (ref 65–99)
Glucose-Capillary: 291 mg/dL — ABNORMAL HIGH (ref 65–99)
Glucose-Capillary: 313 mg/dL — ABNORMAL HIGH (ref 65–99)
Glucose-Capillary: 292 mg/dL — ABNORMAL HIGH (ref 65–99)
Glucose-Capillary: 279 mg/dL — ABNORMAL HIGH (ref 65–99)
Glucose-Capillary: 197 mg/dL — ABNORMAL HIGH (ref 65–99)
Glucose-Capillary: 186 mg/dL — ABNORMAL HIGH (ref 65–99)

## 2014-11-13 LAB — CBC
HCT: 26.6 % — ABNORMAL LOW (ref 35.0–47.0)
Hemoglobin: 8.8 g/dL — ABNORMAL LOW (ref 12.0–16.0)
MCH: 28 pg (ref 26.0–34.0)
MCHC: 33 g/dL (ref 32.0–36.0)
MCV: 84.8 fL (ref 80.0–100.0)
PLATELETS: 356 10*3/uL (ref 150–440)
RBC: 3.14 MIL/uL — AB (ref 3.80–5.20)
RDW: 16.4 % — AB (ref 11.5–14.5)
WBC: 19.1 10*3/uL — AB (ref 3.6–11.0)

## 2014-11-13 LAB — BASIC METABOLIC PANEL
Anion gap: 14 (ref 5–15)
BUN: 98 mg/dL — AB (ref 6–20)
CHLORIDE: 103 mmol/L (ref 101–111)
CO2: 23 mmol/L (ref 22–32)
CREATININE: 3.66 mg/dL — AB (ref 0.44–1.00)
Calcium: 8.4 mg/dL — ABNORMAL LOW (ref 8.9–10.3)
GFR, EST AFRICAN AMERICAN: 13 mL/min — AB (ref >60)
GFR, EST NON AFRICAN AMERICAN: 12 mL/min — AB (ref >60)
Glucose, Bld: 301 mg/dL — ABNORMAL HIGH (ref 65–99)
POTASSIUM: 3.2 mmol/L — AB (ref 3.5–5.1)
SODIUM: 140 mmol/L (ref 135–145)

## 2014-11-13 MED ORDER — PREDNISONE 20 MG PO TABS
30.0000 mg | ORAL_TABLET | Freq: Once | ORAL | Status: AC
  Administered 2014-11-14: 30 mg via ORAL
  Filled 2014-11-13: qty 1

## 2014-11-13 MED ORDER — PREDNISONE 10 MG PO TABS
10.0000 mg | ORAL_TABLET | Freq: Once | ORAL | Status: AC
  Administered 2014-11-16: 10 mg via ORAL
  Filled 2014-11-13: qty 1

## 2014-11-13 MED ORDER — INSULIN DETEMIR 100 UNIT/ML ~~LOC~~ SOLN
20.0000 [IU] | Freq: Two times a day (BID) | SUBCUTANEOUS | Status: DC
  Administered 2014-11-13 – 2014-11-17 (×8): 20 [IU] via SUBCUTANEOUS
  Filled 2014-11-13 (×14): qty 0.2

## 2014-11-13 MED ORDER — INSULIN DETEMIR 100 UNIT/ML ~~LOC~~ SOLN
30.0000 [IU] | Freq: Every day | SUBCUTANEOUS | Status: DC
  Administered 2014-11-13: 30 [IU] via SUBCUTANEOUS
  Filled 2014-11-13 (×2): qty 0.3

## 2014-11-13 MED ORDER — PREDNISONE 20 MG PO TABS
20.0000 mg | ORAL_TABLET | Freq: Once | ORAL | Status: AC
  Administered 2014-11-15: 20 mg via ORAL
  Filled 2014-11-13: qty 1

## 2014-11-13 MED ORDER — POTASSIUM CHLORIDE 20 MEQ PO PACK
40.0000 meq | PACK | Freq: Once | ORAL | Status: AC
  Administered 2014-11-13: 40 meq via ORAL
  Filled 2014-11-13: qty 2

## 2014-11-13 MED ORDER — INSULIN ASPART 100 UNIT/ML ~~LOC~~ SOLN
0.0000 [IU] | SUBCUTANEOUS | Status: DC
  Administered 2014-11-13: 5 [IU] via SUBCUTANEOUS
  Administered 2014-11-13: 8 [IU] via SUBCUTANEOUS
  Administered 2014-11-14: 2 [IU] via SUBCUTANEOUS
  Administered 2014-11-14: 3 [IU] via SUBCUTANEOUS
  Filled 2014-11-13: qty 5
  Filled 2014-11-13: qty 2
  Filled 2014-11-13: qty 8
  Filled 2014-11-13: qty 3

## 2014-11-13 NOTE — Care Management Note (Signed)
Case Management Note  Patient Details  Name: Felicia Vincent MRN: [id number] Date of Birth: January 31, 1946  Subjective/Objective:  Extubated now on 2L. Tolerating well. Plan is to wean insulin and lasix gtt.             Action/Plan:   Expected Discharge Date:                  Expected Discharge Plan:  [name]  In-House Referral:     Discharge planning Services  CM Consult  Post Acute Care Choice:  Home Health Choice offered to:  Adult Children  DME Arranged:    DME Agency:     HH Arranged:    HH Agency:     Status of Service:  In process, will continue to follow  Medicare Important Message Given:  Yes-third notification given Date Medicare IM Given:    Medicare IM give by:    Date Additional Medicare IM Given:    Additional Medicare Important Message give by:     If discussed at [place] of Stay Meetings, dates discussed:    Additional Comments:  Jolly Mango, RN 11/13/2014, 2:37 PM

## 2014-11-13 NOTE — Progress Notes (Signed)
Subjective:  Pt seen at bedside.  Possible extubation this AM.  Cr was up to 3.62 yesterday. Remains on lasix gtt.  UOP 1.6 liters over the past 24 hours.  New Cr this AM pending.  Objective:  Vital signs in last 24 hours:  Temp:  [97.4 F (36.3 C)-97.9 F (36.6 C)] 97.4 F (36.3 C) (09/18 1930) Pulse Rate:  [65-78] 73 (09/19 0000) Resp:  [17-25] 25 (09/19 0000) BP: (106-162)/(49-97) 156/52 mmHg (09/19 0000) SpO2:  [91 %-99 %] 91 % (09/19 0751) FiO2 (%):  [28 %-35 %] 28 % (09/19 0753) Weight:  [93.1 kg (205 lb 4 oz)] 93.1 kg (205 lb 4 oz) (09/19 0500)  Weight change: 1.1 kg (2 lb 6.8 oz) Filed Weights   11/11/14 1300 11/12/14 0500 11/13/14 0500  Weight: 92 kg (202 lb 13.2 oz) 93.3 kg (205 lb 11 oz) 93.1 kg (205 lb 4 oz)    Intake/Output:    Intake/Output Summary (Last 24 hours) at 11/13/14 0919 Last data filed at 11/13/14 0815  Gross per 24 hour  Intake 1529.85 ml  Output   1625 ml  Net -95.15 ml     Physical Exam: General: frail, elderly, critically ill appearing  HEENT ETT in place  Neck supple  Pulm/lungs Minimal rales at bases, on vent  CVS/Heart irregular rhythm, no rub or gallop  Abdomen:  soft, nontender, BS present  Extremities: Trace edema  Neurologic: Awake, alert, following commands readily  Skin: No acute rashes    Foley present        Basic Metabolic Panel:   Recent Labs Lab 11/10/14 0511 11/10/14 1302 11/10/14 1727 11/11/14 0439 11/12/14 0856  NA 138 138 137 137 139  K 2.9* 4.2 4.7 3.6 3.4*  CL 105 106 105 105 105  CO2 26 25 21* 21* 23  GLUCOSE 139* 205* 336* 349* 148*  BUN 40* 41* 45* 61* 82*  CREATININE 2.76* 2.75* 2.79* 3.21* 3.62*  CALCIUM 8.1* 8.3* 8.5* 8.1* 8.8*  MG  --  1.6*  --   --   --   PHOS  --   --   --  3.8  --      CBC:  Recent Labs Lab 11/07/14 1230 11/07/14 1410 11/08/14 0554 11/09/14 0433 11/10/14 0511 11/11/14 0543 11/11/14 0854 11/12/14 0614  WBC 11.8* 11.2* 14.5* 22.1* 27.5* 17.7*  --   --    NEUTROABS 10.2*  --   --   --   --   --   --   --   HGB 7.2* 6.8* 8.5* 8.2* 8.5* 6.4* 6.5* 7.5*  HCT 21.2* 20.8* 23.9* 24.0* 23.8* 19.3*  --   --   MCV 88.8 87.1 93.5 92.2 98.3 86.9  --   --   PLT 242 294 280 351 372 335  --   --       Microbiology:  Recent Results (from the past 720 hour(s))  Culture, blood (routine x 2)     Status: None   Collection Time: 11/07/14 12:30 PM  Result Value Ref Range Status   Specimen Description BLOOD RIGHT ASSIST CONTROL  Final   Special Requests   Final    BOTTLES DRAWN AEROBIC AND ANAEROBIC  2CC AERO 1 CC ANAERO   Culture NO GROWTH 5 DAYS  Final   Report Status 11/12/2014 FINAL  Final  Culture, blood (routine x 2)     Status: None   Collection Time: 11/07/14 12:48 PM  Result Value Ref Range Status  Specimen Description BLOOD LEFT FATTY CASTS  Final   Special Requests BOTTLES DRAWN AEROBIC AND ANAEROBIC  1CC  Final   Culture NO GROWTH 5 DAYS  Final   Report Status 11/12/2014 FINAL  Final  MRSA PCR Screening     Status: None   Collection Time: 11/07/14  6:06 PM  Result Value Ref Range Status   MRSA by PCR NEGATIVE NEGATIVE Final    Comment:        The GeneXpert MRSA Assay (FDA approved for NASAL specimens only), is one component of a comprehensive MRSA colonization surveillance program. It is not intended to diagnose MRSA infection nor to guide or monitor treatment for MRSA infections.   C difficile quick scan w PCR reflex     Status: None   Collection Time: 11/08/14  4:15 AM  Result Value Ref Range Status   C Diff antigen NEGATIVE NEGATIVE Final   C Diff toxin NEGATIVE NEGATIVE Final   C Diff interpretation Negative for C. difficile  Final    Coagulation Studies: No results for input(s): LABPROT, INR in the last 72 hours.  Urinalysis: No results for input(s): COLORURINE, LABSPEC, PHURINE, GLUCOSEU, HGBUR, BILIRUBINUR, KETONESUR, PROTEINUR, UROBILINOGEN, NITRITE, LEUKOCYTESUR in the last 72 hours.  Invalid input(s):  APPERANCEUR    Imaging: Dg Chest Port 1 View  11/13/2014   CLINICAL DATA:  Followup acute hypoxic respiratory failure secondary to bilateral pneumonia. Cough. Now possible CHF.  EXAM: PORTABLE CHEST - 1 VIEW  COMPARISON:  11/11/2014  FINDINGS: Support devices remain in stable position. Improving aeration of the lungs with decreasing bilateral airspace disease. Cardiomegaly. No visible effusions. No acute bony abnormality.  IMPRESSION: Bilateral airspace opacities have improved, likely improving edema and/or pneumonia.   Electronically Signed   By: Rolm Baptise M.D.   On: 11/13/2014 08:57     Medications:   . fentaNYL infusion INTRAVENOUS 25 mcg/hr (11/12/14 1151)  . furosemide (LASIX) infusion 4 mg/hr (11/12/14 1754)  . insulin (NOVOLIN-R) infusion 9.2 Units/hr (11/13/14 0907)   . sodium chloride   Intravenous Once  . antiseptic oral rinse  7 mL Mouth Rinse QID  . aspirin EC  81 mg Oral Daily  . atorvastatin  10 mg Oral QHS  . azithromycin  500 mg Intravenous Q24H  . budesonide (PULMICORT) nebulizer solution  0.25 mg Nebulization BID  . cefTRIAXone (ROCEPHIN)  IV  1 g Intravenous Q24H  . chlorhexidine gluconate  15 mL Mouth Rinse BID  . citalopram  20 mg Oral QPM  . donepezil  5 mg Oral Daily  . feeding supplement (PRO-STAT SUGAR FREE 64)  30 mL Oral Daily  . feeding supplement (VITAL HIGH PROTEIN)  1,000 mL Per Tube Q24H  . free water  25 mL Per Tube Q4H  . Gerhardt's butt cream   Topical QID  . hydrALAZINE  50 mg Oral TID  . insulin glargine  40 Units Subcutaneous QHS  . ipratropium-albuterol  3 mL Nebulization Q4H  . Liraglutide  1.8 mg Subcutaneous Daily  . methylPREDNISolone (SOLU-MEDROL) injection  20 mg Intravenous Q12H  . pantoprazole sodium  40 mg Per Tube Daily  . pneumococcal 23 valent vaccine  0.5 mL Intramuscular Tomorrow-1000  . psyllium  1 packet Oral Daily  . saccharomyces boulardii  250 mg Oral BID  . sodium chloride  10-40 mL Intracatheter Q12H  . sotalol  120  mg Oral BID  . vitamin B-12  1,000 mcg Oral Daily   acetaminophen **OR** acetaminophen, benzonatate, guaiFENesin-dextromethorphan, hydrALAZINE, ondansetron **OR**  ondansetron (ZOFRAN) IV, sodium chloride  Assessment/ Plan:  69 y.o. female with diabetic kidney disease, hypertension, diabetic retinopathy, bilateral cataracts, history of TIA and chronic kidney disease stage V is admitted for pneumonia  1. Acute renal failure on chronic kidney disease stage V/Diabetes mellitus type 2 with CKD V. - Likely ATN due to concurrent illness. Recent worsening due to hypotension prior to intubation BUN and Cr were up yesterday, awaiting new Cr this AM, but will go ahead and stop lasix gtt for now as azotemia was worsening, minimal edema on exam.   2. Acute respiratory failure secondary to bilateral pneumonia and Pulm edema -  To be extubated this AM, discontinue lasix.   3. Hypertension  -BP 156/52, continue hydralazine for now.  4. Hyperkalemia, now hypokalemia - K was 3.4, awaiting new K today.       LOS: 6 Lupie Sawa 9/19/20169:19 AM

## 2014-11-13 NOTE — Progress Notes (Addendum)
PULMONARY / CRITICAL CARE MEDICINE   Name: Felicia Vincent MRN: [id number] DOB: Mar 19, 1945    ASSESSMENT / PLAN: 69 yo white female with acute and progressive hypoxic resp failure  From pneumonia with progressive renal failure  PULMONARY A: -Respiratory Failure due to pneumonia -doing better today, will perform weaning trial on vent and extubate if tolerated.  -continue Bronchodilator Therapy -Wean Fio2 and PEEP as tolerated   CARDIOVASCULAR -continue icu monitoring  RENAL A:  AKI, nonoliguric  Monitor BMET intermittently Monitor I/Os Correct electrolytes as indicated Avoid nephrotoxins Renal Service following - furosemide infusion currently running at 4 mg/hour.   GASTROINTESTINAL -OG in place, started TF's  HEMATOLOGIC A:   Anemia, unclear etiology. No overt bleeding presently P:  DVT px: SCDs Monitor CBC intermittently Transfuse per usual guidelines  INFECTIOUS Pneumonia -continue abx as prescribed  ENDOCRINE A:  DM2, improved control Weaning off insulin drip. This morning the patient is being started on long-acting insulin.   NEUROLOGIC --Awake and alert this morning on the vent.     ADMISSION DATE:  11/07/2014  INITIAL PRESENTATION:  2 F initially seen as outpt one week PTA with complaints of weakness, dyspnea, cough and treated for PNA as outpt with levofloxacin. Was intolerant of this medication due to nausea and was changed to amoxicillin 4 days PTA. Seen in ED 9/13 with worsening dyspnea and admitted with dx of acute hypoxic resp failure due to CAP  MAJOR EVENTS/TEST RESULTS: 9/13 Admitted with above history. Transferred one unit PRBCs for Hgb 6.8.  9/14 Nephrology consultation 9/14 GI consultation. No indication for endoscopy 9/14 PCCM consultation for worsening hypoxic failure. CXR deemed c/w CAP 9/15 Pt improved with less dyspnea. Cr improving 9/16 Worsening hypoxemia. CXR c/w edema. Furosemide gtt initiated by Renal service. Cycling between  BiPAP and high flow O2. Intermittently agitated. Low dose lorazepam ordered 9/16 worsening resp status-intubated,sedated 9/17-9/18 wean fio2 as tolerated  INDWELLING DEVICES:: RT IJ CVL>>9/16   MICRO DATA: MRSA PCR 9/13 >> NEG C diff PCR 9/14 >> NEG Blood 9/13 >>   ANTIMICROBIALS:  Pip-tazo 9/13 >> 9/14 Vanc 9/13 >> 9/16  Azithromycin 9/13 >>  Ceftriaxone 9/14 >>    SUBJECTIVE:  Remains critically ill, remains on vent, Could not obtain review of systems as patient is currently intubated and on the ventilator. However, she appears to be awake and responsive this morning, she follows commands on examination.  VITAL SIGNS: Temp:  [97.4 F (36.3 C)-97.9 F (36.6 C)] 97.4 F (36.3 C) (09/18 1930) Pulse Rate:  [65-78] 73 (09/19 0000) Resp:  [17-25] 25 (09/19 0000) BP: (106-162)/(49-97) 156/52 mmHg (09/19 0000) SpO2:  [91 %-99 %] 91 % (09/19 0751) FiO2 (%):  [28 %-35 %] 28 % (09/19 0753) Weight:  [93.1 kg (205 lb 4 oz)] 93.1 kg (205 lb 4 oz) (09/19 0500) HEMODYNAMICS:   VENTILATOR SETTINGS: Vent Mode:  [-] Spontaneous FiO2 (%):  [28 %-35 %] 28 % Set Rate:  [25 bmp] 25 bmp Vt Set:  [500 mL] 500 mL PEEP:  [5 cmH20] 5 cmH20 Pressure Support:  [5 cmH20] 5 cmH20 INTAKE / OUTPUT:  Intake/Output Summary (Last 24 hours) at 11/13/14 1191 Last data filed at 11/13/14 0815  Gross per 24 hour  Intake 1529.85 ml  Output   1625 ml  Net -95.15 ml    Review of Systems  Unable to perform ROS: critical illness     PHYSICAL EXAMINATION: Physical Exam  Constitutional: No distress.  HENT:  Head: Normocephalic and atraumatic.  Eyes:  Pupils are equal, round, and reactive to light. No scleral icterus.  Neck: Normal range of motion. Neck supple.  Cardiovascular: Normal rate and regular rhythm.   No murmur heard. Pulmonary/Chest: No respiratory distress. She has no wheezes. She has rales.  resp distress  Abdominal: Soft. She exhibits no distension. There is no tenderness.   Musculoskeletal: She exhibits no edema.  Neurological: She displays normal reflexes. Coordination normal.  gcs<8T  Skin: Skin is warm. No rash noted. She is not diaphoretic.    LABS:  CBC  Recent Labs Lab 11/09/14 0433 11/10/14 0511 11/11/14 0543 11/11/14 0854 11/12/14 0614  WBC 22.1* 27.5* 17.7*  --   --   HGB 8.2* 8.5* 6.4* 6.5* 7.5*  HCT 24.0* 23.8* 19.3*  --   --   PLT 351 372 335  --   --    Coag's No results for input(s): APTT, INR in the last 168 hours. BMET  Recent Labs Lab 11/10/14 1727 11/11/14 0439 11/12/14 0856  NA 137 137 139  K 4.7 3.6 3.4*  CL 105 105 105  CO2 21* 21* 23  BUN 45* 61* 82*  CREATININE 2.79* 3.21* 3.62*  GLUCOSE 336* 349* 148*   Electrolytes  Recent Labs Lab 11/10/14 1302 11/10/14 1727 11/11/14 0439 11/12/14 0856  CALCIUM 8.3* 8.5* 8.1* 8.8*  MG 1.6*  --   --   --   PHOS  --   --  3.8  --    Sepsis Markers  Recent Labs Lab 11/07/14 1248 11/07/14 1802  LATICACIDVEN 1.0 1.5   ABG  Recent Labs Lab 11/10/14 2220 11/11/14 0121 11/11/14 0830  PHART 7.23* 7.30* 7.36  PCO2ART 58* 36 36  PO2ART 77* 178* 50*   Liver Enzymes  Recent Labs Lab 11/07/14 1230 11/11/14 0439  AST 12*  --   ALT 10*  --   ALKPHOS 78  --   BILITOT 0.9  --   ALBUMIN 2.4* 1.7*   Cardiac Enzymes  Recent Labs Lab 11/07/14 1230  TROPONINI <0.03   Glucose  Recent Labs Lab 11/12/14 2350 11/13/14 0409 11/13/14 0556 11/13/14 0654 11/13/14 0759 11/13/14 0903  GLUCAP 164* 273* 291* 313* 311* 292*    CXR: edema pattern    Remains critically ill, continue ICU monitoring  Deep Ashby Dawes, M.D.  [facility]    Critical Care Attestation.  I have personally obtained a history, examined the patient, evaluated laboratory and imaging results, formulated the assessment and plan and placed orders. The Patient requires high complexity decision making for assessment and support, frequent evaluation  and titration of therapies, application of advanced monitoring technologies and extensive interpretation of multiple databases. The patient has critical illness that could lead imminently to failure of 1 or more organ systems and requires the highest level of physician preparedness to intervene.  Critical Care Time devoted to patient care services described in this note is 35 minutes and is exclusive of time spent in procedures.

## 2014-11-13 NOTE — Care Management Important Message (Signed)
Important Message  Patient Details  Name: Felicia Vincent MRN: [id number] Date of Birth: Jul 19, 1945   Medicare Important Message Given:  Yes-third notification given    Darius Bump Allmond 11/13/2014, 2:24 PM

## 2014-11-13 NOTE — Progress Notes (Addendum)
Inpatient Diabetes Program Recommendations  AACE/ADA: New Consensus Statement on Inpatient Glycemic Control (2015)  Target Ranges:  Prepandial:   less than 140 mg/dL      Peak postprandial:   less than 180 mg/dL (1-2 hours)      Critically ill patients:  140 - 180 mg/dL    Results for SAIDY, ORMAND (MRN [id number]) as of 11/13/2014 11:39  Ref. Range 11/12/2014 23:50 11/13/2014 04:09 11/13/2014 05:56 11/13/2014 06:54 11/13/2014 07:59 11/13/2014 09:03 11/13/2014 10:13 11/13/2014 11:19  Glucose-Capillary Latest Ref Range: 65-99 mg/dL 164 (H) 273 (H) 291 (H) 313 (H) 311 (H) 292 (H) 279 (H) 197 (H)    Home DM Meds: Levemir 30-35 units QHS       Amaryl 2 mg bid       Victoza 1.8 mg daily  Current DM Orders: Lantus 40 units QHS            Levemir 30 units QAM            IV Insulin drip     -Note IV insulin drip was initiated at midnight on 09/17.  RN was attempting to transition patient off IV insulin drip last night (09/18) to Phase 3 of the ICU Glycemic Control Order set, however, IV insulin drip rate was too high per ICU Glycemic Control Protocol order set (in order to transition patient to SQ insulin per ICU Glycemic Control Order set, IV insulin drip rate must be less than or equal to 4 units per hour.  IV insulin drip rate last night was greater than 6 units per hour).  Patient likely needed to stay on IV insulin for an extended period to allow her insulin needs to stabilize.  -Note patient was given Lantus 40 units last night at 11pm.  -Patient also getting Levemir 30 units daily.  -Patient currently has orders for Lantus 40 units QHS, Levemir 30 units daily, and IV Insulin drip.  -It will be difficult to determine patient's actual insulin needs as patient is getting both SQ insulin and IV insulin at the same time.    -Note IV Solumedrol stopped today.  Last dose given today at 5am.  Patient now getting Prednisone taper.    MD- Please consider the following when transitioning patient  off the IV insulin drip:  1. Stop Lantus 40 units QHS  2. Continue Levemir 30 units daily  3. Start Novolog Moderate SSI (0-15 units) Q4 hours per Glycemic Control Order set   Addendum from 1230pm: Spoke with RN caring for pt today.  Pt extubated and tube feeds off.  Discussed with RN that patient is currently getting two basal insulins (Lantus and Levemir).  RN relayed to me that plan is to stop IV insulin drip in 2 hours.  Asked RN to please call MD and ask for order to stop Lantus 40 units QHS, continue Levemir 30 units daily, and Start Novolog Moderate SSI Q4 hours.    Will follow Wyn Quaker RN, MSN, CDE Diabetes Coordinator Inpatient Glycemic Control Team Team Pager: [phone] (8a-5p)

## 2014-11-13 NOTE — Progress Notes (Signed)
An extubation order from Dr. Alphonsus Sias was given. Pt. Was suctioned prior to extubation for a scant amount of clear secretions. She was extubated without incident and placed on 2 L nasal cannula. Pt. Is voicing and without stridor.

## 2014-11-13 NOTE — Progress Notes (Signed)
Nutrition Follow-up    INTERVENTION:   Meals and Snacks: Cater to patient preferences, diet progression as tolerated Medical Food Supplement Therapy: if po intake inadequate on follow-up, recommend addition of nutritional supplement  NUTRITION DIAGNOSIS:   Inadequate oral intake related to acute illness as evidenced by NPO status. Being addressed as diet advanced post extubation  GOAL:   Patient will meet greater than or equal to 90% of their needs  MONITOR:    (Energy intake, Pulmonary profile, Electrolyte and renal profile)  ASSESSMENT:    Pt s/p extubation this AM  Diet Order:  Diet full liquid Room service appropriate?: Yes; Fluid consistency:: Thin   Energy Intake: diet just advanced post extubation this AM, receiving TF at goal rate while intubated,discontinued with extubation  Digestive System: rectal tube in place, liquid stool but no documentation of amount since 9/16  Electrolyte and Renal Profile:  Recent Labs Lab 11/10/14 1302  11/11/14 0439 11/12/14 0856 11/13/14 0856  BUN 41*  < > 61* 82* 98*  CREATININE 2.75*  < > 3.21* 3.62* 3.66*  NA 138  < > 137 139 140  K 4.2  < > 3.6 3.4* 3.2*  MG 1.6*  --   --   --   --   PHOS  --   --  3.8  --   --   < > = values in this interval not displayed. Glucose Profile:  Recent Labs  11/13/14 1013 11/13/14 1119 11/13/14 1221  GLUCAP 279* 197* 154*   Protein Profile:  Recent Labs Lab 11/07/14 1230 11/11/14 0439  ALBUMIN 2.4* 1.7*   Meds: lantus, levemir, transitioning off insulin drip, prednisone  Height:   Ht Readings from Last 1 Encounters:  11/07/14 5\' 1"  (1.549 m)    Weight:   Wt Readings from Last 1 Encounters:  11/13/14 205 lb 4 oz (93.1 kg)    BMI:  Body mass index is 38.8 kg/(m^2).  Estimated Nutritional Needs:   Kcal:  (11-14 kcals/kg) Using ABW of 88kg.  888-9169 kcals/d  Protein:  (2.0-2.5 g/kg) Using IBW of 48 kg 96-120 g/d  Fluid:  (25-54ml/kg) 1200-1456ml/d  HIGH Care  Level  Kerman Passey MS, RD, LDN [phone] Pager

## 2014-11-13 NOTE — Progress Notes (Signed)
[place] [facility] [place] NAME: Felicia Vincent    MR#:  [id number]  DATE OF BIRTH:  March 27, 1945  SUBJECTIVE:  Got inutbated due to impending respiratory failure day 4, awake and alert and communicative  REVIEW OF SYSTEMS:   Review of Systems  Unable to perform ROS: intubated   DRUG ALLERGIES:   Allergies  Allergen Reactions  . Codeine Nausea Only  . Prednisone Other (See Comments)    Pts daughter states that this medication "makes her evil".      VITALS:  Blood pressure 168/62, pulse 71, temperature 97.4 F (36.3 C), temperature source Axillary, resp. rate 19, height 5\' 1"  (1.549 m), weight 93.1 kg (205 lb 4 oz), SpO2 94 %.  PHYSICAL EXAMINATION:   Physical Exam  GENERAL:  69-year-old patient lying in the bed with no acute distress. critically ill EYES: Pupils equal, round, reactive to light and accommodation. No scleral icterus. Extraocular muscles intact.  HEENT: Head atraumatic, normocephalic. Oropharynx and nasopharynx clear. Intubated and on the vent NECK:  Supple, no jugular venous distention. No thyroid enlargement, no tenderness.  LUNGS: Normal breath sounds bilaterally, no wheezing, rales, rhonchi. No use of accessory muscles of respiration.  CARDIOVASCULAR: S1, S2 normal. No murmurs, rubs, or gallops.  ABDOMEN: Soft, nontender, nondistended. Bowel sounds present. No organomegaly or mass.  EXTREMITIES: No cyanosis, clubbing or edema b/l.    NEUROLOGIC: unable to assess, on the vent PSYCHIATRIC: unable to assess, on the vent SKIN: No obvious rash, lesion, or ulcer.   LABORATORY PANEL:   CBC  Recent Labs Lab 11/13/14 0856  WBC 19.1*  HGB 8.8*  HCT 26.6*  PLT 356    Chemistries   Recent Labs Lab 11/07/14 1230  11/10/14 1302  11/13/14 0856  NA 126*  < > 138  < > 140  K 5.6*  < > 4.2  < > 3.2*  CL 93*  < > 106  < > 103  CO2 23  < > 25  < > 23  GLUCOSE 575*  < > 205*  < > 301*  BUN 71*  < > 41*  < >  98*  CREATININE 4.63*  < > 2.75*  < > 3.66*  CALCIUM 7.9*  < > 8.3*  < > 8.4*  MG  --   --  1.6*  --   --   AST 12*  --   --   --   --   ALT 10*  --   --   --   --   ALKPHOS 78  --   --   --   --   BILITOT 0.9  --   --   --   --   < > = values in this interval not displayed.  Cardiac Enzymes  Recent Labs Lab 11/07/14 1230  TROPONINI <0.03    RADIOLOGY:  Dg Chest Port 1 View  11/13/2014   CLINICAL DATA:  Followup acute hypoxic respiratory failure secondary to bilateral pneumonia. Cough. Now possible CHF.  EXAM: PORTABLE CHEST - 1 VIEW  COMPARISON:  11/11/2014  FINDINGS: Support devices remain in stable position. Improving aeration of the lungs with decreasing bilateral airspace disease. Cardiomegaly. No visible effusions. No acute bony abnormality.  IMPRESSION: Bilateral airspace opacities have improved, likely improving edema and/or pneumonia.   Electronically Signed   By: Rolm Baptise M.D.   On: 11/13/2014 08:57     ASSESSMENT AND PLAN:   69-year old with bilateral  pneumonia  1.  acute hypoxic respiratory failure secondary to bilateral pneumonia: and now with possible CHF acute systolic/diastolic continue with  Ceftriaxone, azithromycin Chest x-rays with persistent bilateral infiltrates now concern for CHF  on IV Lasix therapy, good uop, repeat cxr today shows improvement in infiltrates appreciate cardiology and nephrology input Rising creatinine  2. Acute renal failure on chronic renal failure due to dehydration: Baseline seems to be at 3.0 On IV lasix gtt  avoid nephrotoxins Received Kayexalate for hyperkalemia  3. Hypertension continue imdur and hydralazine   4. Diabetes type 2 with hyperglycemia  -now on insulin gtt -added levemir  5. History of CVA continue aspirin  6. Diarrhea C. difficile toxin is negative  6.   DVT prophylaxis heparin  Case discussed with Care Management/Social Worker.  CODE STATUS: full  DVT Prophylaxis: heparin  TOTAL critical  TIME TAKING CARE OF THIS PATIENT: 71minutes.  >50% time spent on counselling and coordination of care Dr Janey Genta M.D on 11/13/2014 at 11:54 AM  Between 7am to 6pm - Pager - [phone]  After 6pm go to [email or web address] - password [email or web address] [facility]  Office  [phone]  CC: Primary care physician; Lavera Guise, MD

## 2014-11-14 ENCOUNTER — Inpatient Hospital Stay: Payer: Medicare Other

## 2014-11-14 ENCOUNTER — Encounter: Payer: Self-pay | Admitting: Hematology and Oncology

## 2014-11-14 DIAGNOSIS — N189 Chronic kidney disease, unspecified: Secondary | ICD-10-CM

## 2014-11-14 DIAGNOSIS — D631 Anemia in chronic kidney disease: Secondary | ICD-10-CM | POA: Insufficient documentation

## 2014-11-14 LAB — BLOOD GAS, ARTERIAL
ACID-BASE DEFICIT: 2.8 mmol/L — AB (ref 0.0–2.0)
ACID-BASE EXCESS: 5.9 mmol/L — AB (ref 0.0–3.0)
Acid-base deficit: 4.7 mmol/L — ABNORMAL HIGH (ref 0.0–2.0)
Allens test (pass/fail): POSITIVE — AB
Bicarbonate: 23.2 mEq/L (ref 21.0–28.0)
Bicarbonate: 20.3 mEq/L — ABNORMAL LOW (ref 21.0–28.0)
Bicarbonate: 29.7 mEq/L — ABNORMAL HIGH (ref 21.0–28.0)
DELIVERY SYSTEMS: POSITIVE
Expiratory PAP: 6
FIO2: 0.85
FIO2: 0.5
FIO2: 0.32
Inspiratory PAP: 12
LHR: 25 {breaths}/min
MECHVT: 500 mL
O2 SAT: 91.9 %
O2 SAT: 93.5 %
O2 Saturation: 83.3 %
PATIENT TEMPERATURE: 37
PCO2 ART: 45 mmHg (ref 32.0–48.0)
PCO2 ART: 36 mmHg (ref 32.0–48.0)
PEEP: 5 cmH2O
PH ART: 7.49 — AB (ref 7.350–7.450)
PO2 ART: 69 mmHg — AB (ref 83.0–108.0)
PO2 ART: 50 mmHg — AB (ref 83.0–108.0)
Patient temperature: 37
Patient temperature: 37
pCO2 arterial: 39 mmHg (ref 32.0–48.0)
pH, Arterial: 7.32 — ABNORMAL LOW (ref 7.350–7.450)
pH, Arterial: 7.36 (ref 7.350–7.450)
pO2, Arterial: 63 mmHg — ABNORMAL LOW (ref 83.0–108.0)

## 2014-11-14 LAB — BASIC METABOLIC PANEL
ANION GAP: 12 (ref 5–15)
BUN: 74 mg/dL — ABNORMAL HIGH (ref 6–20)
CALCIUM: 8.7 mg/dL — AB (ref 8.9–10.3)
CO2: 27 mmol/L (ref 22–32)
Chloride: 103 mmol/L (ref 101–111)
Creatinine, Ser: 3.29 mg/dL — ABNORMAL HIGH (ref 0.44–1.00)
GFR calc non Af Amer: 13 mL/min — ABNORMAL LOW (ref >60)
GFR, EST AFRICAN AMERICAN: 15 mL/min — AB (ref >60)
Glucose, Bld: 62 mg/dL — ABNORMAL LOW (ref 65–99)
Potassium: 3.1 mmol/L — ABNORMAL LOW (ref 3.5–5.1)
Sodium: 142 mmol/L (ref 135–145)

## 2014-11-14 LAB — GLUCOSE, CAPILLARY
Glucose-Capillary: 119 mg/dL — ABNORMAL HIGH (ref 65–99)
Glucose-Capillary: 138 mg/dL — ABNORMAL HIGH (ref 65–99)
Glucose-Capillary: 140 mg/dL — ABNORMAL HIGH (ref 65–99)
Glucose-Capillary: 178 mg/dL — ABNORMAL HIGH (ref 65–99)
Glucose-Capillary: 223 mg/dL — ABNORMAL HIGH (ref 65–99)
Glucose-Capillary: 68 mg/dL (ref 65–99)
Glucose-Capillary: 74 mg/dL (ref 65–99)

## 2014-11-14 LAB — CBC
HEMATOCRIT: 27.8 % — AB (ref 35.0–47.0)
HEMOGLOBIN: 9.3 g/dL — AB (ref 12.0–16.0)
MCH: 28.2 pg (ref 26.0–34.0)
MCHC: 33.3 g/dL (ref 32.0–36.0)
MCV: 84.7 fL (ref 80.0–100.0)
Platelets: 372 10*3/uL (ref 150–440)
RBC: 3.28 MIL/uL — ABNORMAL LOW (ref 3.80–5.20)
RDW: 16.6 % — AB (ref 11.5–14.5)
WBC: 19.8 10*3/uL — AB (ref 3.6–11.0)

## 2014-11-14 LAB — HEMOGLOBIN A1C: Hgb A1c MFr Bld: 7 % — ABNORMAL HIGH (ref 4.0–6.0)

## 2014-11-14 MED ORDER — AMLODIPINE BESYLATE 5 MG PO TABS
5.0000 mg | ORAL_TABLET | Freq: Every day | ORAL | Status: DC
  Administered 2014-11-14 – 2014-11-16 (×3): 5 mg via ORAL
  Filled 2014-11-14 (×3): qty 1

## 2014-11-14 MED ORDER — HEPARIN SOD (PORK) LOCK FLUSH 10 UNIT/ML IV SOLN
INTRAVENOUS | Status: AC
  Filled 2014-11-14: qty 1

## 2014-11-14 MED ORDER — GLIMEPIRIDE 2 MG PO TABS
1.0000 mg | ORAL_TABLET | Freq: Every day | ORAL | Status: DC
  Administered 2014-11-14 – 2014-11-15 (×2): 1 mg via ORAL
  Filled 2014-11-14: qty 2
  Filled 2014-11-14 (×2): qty 1

## 2014-11-14 MED ORDER — PANTOPRAZOLE SODIUM 40 MG PO TBEC
40.0000 mg | DELAYED_RELEASE_TABLET | Freq: Every day | ORAL | Status: DC
  Administered 2014-11-14 – 2014-11-17 (×4): 40 mg via ORAL
  Filled 2014-11-14 (×4): qty 1

## 2014-11-14 MED ORDER — INSULIN ASPART 100 UNIT/ML ~~LOC~~ SOLN
0.0000 [IU] | Freq: Three times a day (TID) | SUBCUTANEOUS | Status: DC
  Administered 2014-11-15: 3 [IU] via SUBCUTANEOUS
  Administered 2014-11-15: 2 [IU] via SUBCUTANEOUS
  Administered 2014-11-15: 15 [IU] via SUBCUTANEOUS
  Filled 2014-11-14: qty 3
  Filled 2014-11-14: qty 2
  Filled 2014-11-14: qty 15

## 2014-11-14 MED ORDER — CEFUROXIME AXETIL 250 MG PO TABS
500.0000 mg | ORAL_TABLET | Freq: Two times a day (BID) | ORAL | Status: DC
  Administered 2014-11-14 – 2014-11-17 (×7): 500 mg via ORAL
  Filled 2014-11-14 (×3): qty 2
  Filled 2014-11-14: qty 1
  Filled 2014-11-14 (×3): qty 2

## 2014-11-14 MED ORDER — HYDRALAZINE HCL 20 MG/ML IJ SOLN
10.0000 mg | INTRAMUSCULAR | Status: DC | PRN
  Administered 2014-11-14: 10 mg via INTRAVENOUS
  Filled 2014-11-14: qty 1

## 2014-11-14 MED ORDER — ISOSORBIDE MONONITRATE ER 60 MG PO TB24
90.0000 mg | ORAL_TABLET | Freq: Every day | ORAL | Status: DC
  Administered 2014-11-14 – 2014-11-17 (×4): 90 mg via ORAL
  Filled 2014-11-14 (×4): qty 1

## 2014-11-14 MED ORDER — FUROSEMIDE 20 MG PO TABS
20.0000 mg | ORAL_TABLET | Freq: Every day | ORAL | Status: DC
  Administered 2014-11-14 – 2014-11-17 (×4): 20 mg via ORAL
  Filled 2014-11-14 (×4): qty 1

## 2014-11-14 MED ORDER — CLOPIDOGREL BISULFATE 75 MG PO TABS
75.0000 mg | ORAL_TABLET | Freq: Every day | ORAL | Status: DC
  Administered 2014-11-14 – 2014-11-16 (×3): 75 mg via ORAL
  Filled 2014-11-14 (×3): qty 1

## 2014-11-14 MED ORDER — POTASSIUM CHLORIDE 10 MEQ/100ML IV SOLN
10.0000 meq | INTRAVENOUS | Status: AC
  Administered 2014-11-14 (×4): 10 meq via INTRAVENOUS
  Filled 2014-11-14 (×4): qty 100

## 2014-11-14 MED ORDER — GLIMEPIRIDE 2 MG PO TABS: 2.0000 mg | ORAL_TABLET | Freq: Two times a day (BID) | ORAL | Status: DC

## 2014-11-14 MED ORDER — GUAIFENESIN ER 600 MG PO TB12
600.0000 mg | ORAL_TABLET | Freq: Two times a day (BID) | ORAL | Status: DC
Start: 2014-11-14 — End: 2014-11-17
  Administered 2014-11-14 – 2014-11-17 (×7): 600 mg via ORAL
  Filled 2014-11-14 (×7): qty 1

## 2014-11-14 MED ORDER — CITALOPRAM HYDROBROMIDE 20 MG PO TABS: 40.0000 mg | ORAL_TABLET | Freq: Every evening | ORAL | Status: DC

## 2014-11-14 MED ORDER — HEPARIN SODIUM (PORCINE) 5000 UNIT/ML IJ SOLN
5000.0000 [IU] | Freq: Two times a day (BID) | INTRAMUSCULAR | Status: DC
  Administered 2014-11-14 – 2014-11-16 (×5): 5000 [IU] via SUBCUTANEOUS
  Filled 2014-11-14 (×5): qty 1

## 2014-11-14 NOTE — Progress Notes (Signed)
PT Cancellation Note  Patient Details Name: Felicia Vincent MRN: [id number] DOB: 1945-12-23   Cancelled Treatment:    Reason Eval/Treat Not Completed: Other (comment) (Pt receiving chest PT in bed.)  Will re-attempt PT eval at a later date/time as able.   [name] 11/14/2014, 11:41 AM Leitha Bleak, [place]

## 2014-11-14 NOTE — Progress Notes (Signed)
Subjective:  Pt resting comfortably. Has improved. Now extubated.  Breathing comfortably. Cr down to 3.29. BUN down to 74. Good UOP noted.  Objective:  Vital signs in last 24 hours:  Temp:  [97.9 F (36.6 C)-98.4 F (36.9 C)] 97.9 F (36.6 C) (09/20 0808) Pulse Rate:  [63-81] 63 (09/20 0800) Resp:  [14-26] 21 (09/20 0800) BP: (156-206)/(56-91) 188/73 mmHg (09/20 0800) SpO2:  [87 %-97 %] 96 % (09/20 0800) Weight:  [90.5 kg (199 lb 8.3 oz)] 90.5 kg (199 lb 8.3 oz) (09/20 0800)  Weight change:  Filed Weights   11/12/14 0500 11/13/14 0500 11/14/14 0800  Weight: 93.3 kg (205 lb 11 oz) 93.1 kg (205 lb 4 oz) 90.5 kg (199 lb 8.3 oz)    Intake/Output:    Intake/Output Summary (Last 24 hours) at 11/14/14 0848 Last data filed at 11/14/14 0800  Gross per 24 hour  Intake    409 ml  Output   4200 ml  Net  -3791 ml     Physical Exam: General: frail, elderly, no acute distress  HEENT Extubated, eyes open, OM moist  Neck supple  Pulm/lungs Scattered rhonchi, normal effort  CVS/Heart irregular rhythm, no rub or gallop  Abdomen:  soft, nontender, BS present  Extremities: Trace edema  Neurologic: Awake, alert, following commands readily  Skin: No acute rashes    Foley present        Basic Metabolic Panel:   Recent Labs Lab 11/10/14 1302 11/10/14 1727 11/11/14 0439 11/12/14 0856 11/13/14 0856 11/14/14 0500  NA 138 137 137 139 140 142  K 4.2 4.7 3.6 3.4* 3.2* 3.1*  CL 106 105 105 105 103 103  CO2 25 21* 21* 23 23 27   GLUCOSE 205* 336* 349* 148* 301* 62*  BUN 41* 45* 61* 82* 98* 74*  CREATININE 2.75* 2.79* 3.21* 3.62* 3.66* 3.29*  CALCIUM 8.3* 8.5* 8.1* 8.8* 8.4* 8.7*  MG 1.6*  --   --   --   --   --   PHOS  --   --  3.8  --   --   --      CBC:  Recent Labs Lab 11/07/14 1230  11/09/14 0433 11/10/14 0511 11/11/14 0543 11/11/14 0854 11/12/14 0614 11/13/14 0856 11/14/14 0500  WBC 11.8*  < > 22.1* 27.5* 17.7*  --   --  19.1* 19.8*  NEUTROABS 10.2*  --    --   --   --   --   --   --   --   HGB 7.2*  < > 8.2* 8.5* 6.4* 6.5* 7.5* 8.8* 9.3*  HCT 21.2*  < > 24.0* 23.8* 19.3*  --   --  26.6* 27.8*  MCV 88.8  < > 92.2 98.3 86.9  --   --  84.8 84.7  PLT 242  < > 351 372 335  --   --  356 372  < > = values in this interval not displayed.    Microbiology:  Recent Results (from the past 720 hour(s))  Culture, blood (routine x 2)     Status: None   Collection Time: 11/07/14 12:30 PM  Result Value Ref Range Status   Specimen Description BLOOD RIGHT ASSIST CONTROL  Final   Special Requests   Final    BOTTLES DRAWN AEROBIC AND ANAEROBIC  2CC AERO 1 CC ANAERO   Culture NO GROWTH 5 DAYS  Final   Report Status 11/12/2014 FINAL  Final  Culture, blood (routine x 2)  Status: None   Collection Time: 11/07/14 12:48 PM  Result Value Ref Range Status   Specimen Description BLOOD LEFT FATTY CASTS  Final   Special Requests BOTTLES DRAWN AEROBIC AND ANAEROBIC  1CC  Final   Culture NO GROWTH 5 DAYS  Final   Report Status 11/12/2014 FINAL  Final  MRSA PCR Screening     Status: None   Collection Time: 11/07/14  6:06 PM  Result Value Ref Range Status   MRSA by PCR NEGATIVE NEGATIVE Final    Comment:        The GeneXpert MRSA Assay (FDA approved for NASAL specimens only), is one component of a comprehensive MRSA colonization surveillance program. It is not intended to diagnose MRSA infection nor to guide or monitor treatment for MRSA infections.   C difficile quick scan w PCR reflex     Status: None   Collection Time: 11/08/14  4:15 AM  Result Value Ref Range Status   C Diff antigen NEGATIVE NEGATIVE Final   C Diff toxin NEGATIVE NEGATIVE Final   C Diff interpretation Negative for C. difficile  Final    Coagulation Studies: No results for input(s): LABPROT, INR in the last 72 hours.  Urinalysis: No results for input(s): COLORURINE, LABSPEC, PHURINE, GLUCOSEU, HGBUR, BILIRUBINUR, KETONESUR, PROTEINUR, UROBILINOGEN, NITRITE, LEUKOCYTESUR in  the last 72 hours.  Invalid input(s): APPERANCEUR    Imaging: Dg Chest 1 View  11/14/2014   CLINICAL DATA:  Dyspnea.  EXAM: CHEST  1 VIEW  COMPARISON:  11/13/2014 and 11/10/2014  FINDINGS: Right IJ central venous catheter with tip just below the cavoatrial junction without significant change. Interval removal of nasogastric tube and endotracheal tube.  Lungs are adequately inflated demonstrate persistent bilateral patchy airspace opacification which may be slightly worse over the right upper lobe compared to 11/13/2014, although greatly improved compared to 11/10/2014. No evidence of effusion. Mild stable cardiomegaly. Remainder of the exam is unchanged.  IMPRESSION: Patchy bilateral airspace process with marked improvement compared to 11/10/2014.  Stable cardiomegaly.  Right IJ central venous catheter with tip just below the cavoatrial junction.   Electronically Signed   By: Marin Olp M.D.   On: 11/14/2014 08:13   Dg Chest Port 1 View  11/13/2014   CLINICAL DATA:  Followup acute hypoxic respiratory failure secondary to bilateral pneumonia. Cough. Now possible CHF.  EXAM: PORTABLE CHEST - 1 VIEW  COMPARISON:  11/11/2014  FINDINGS: Support devices remain in stable position. Improving aeration of the lungs with decreasing bilateral airspace disease. Cardiomegaly. No visible effusions. No acute bony abnormality.  IMPRESSION: Bilateral airspace opacities have improved, likely improving edema and/or pneumonia.   Electronically Signed   By: Rolm Baptise M.D.   On: 11/13/2014 08:57     Medications:   . insulin (NOVOLIN-R) infusion 9.2 Units/hr (11/13/14 0907)   . sodium chloride   Intravenous Once  . amLODipine  5 mg Oral QHS  . aspirin EC  81 mg Oral Daily  . atorvastatin  10 mg Oral QHS  . budesonide (PULMICORT) nebulizer solution  0.25 mg Nebulization BID  . cefUROXime  500 mg Oral BID WC  . chlorhexidine gluconate  15 mL Mouth Rinse BID  . citalopram  20 mg Oral QPM  . clopidogrel  75 mg  Oral QHS  . donepezil  5 mg Oral Daily  . furosemide  20 mg Oral Daily  . Gerhardt's butt cream   Topical QID  . glimepiride  1 mg Oral Daily  . guaiFENesin  600  mg Oral BID  . hydrALAZINE  50 mg Oral TID  . insulin aspart  0-15 Units Subcutaneous 6 times per day  . insulin detemir  20 Units Subcutaneous BID  . ipratropium-albuterol  3 mL Nebulization Q4H  . isosorbide mononitrate  90 mg Oral Daily  . pantoprazole sodium  40 mg Per Tube Daily  . pneumococcal 23 valent vaccine  0.5 mL Intramuscular Tomorrow-1000  . [START ON 11/16/2014] predniSONE  10 mg Oral Once  . [START ON 11/15/2014] predniSONE  20 mg Oral Once  . predniSONE  30 mg Oral Once  . psyllium  1 packet Oral Daily  . saccharomyces boulardii  250 mg Oral BID  . sodium chloride  10-40 mL Intracatheter Q12H  . sotalol  120 mg Oral BID  . vitamin B-12  1,000 mcg Oral Daily   acetaminophen **OR** acetaminophen, benzonatate, guaiFENesin-dextromethorphan, hydrALAZINE, ondansetron **OR** ondansetron (ZOFRAN) IV, sodium chloride  Assessment/ Plan:  69 y.o. female with diabetic kidney disease, hypertension, diabetic retinopathy, bilateral cataracts, history of TIA and chronic kidney disease stage V is admitted for pneumonia  1. Acute renal failure on chronic kidney disease stage V/Diabetes mellitus type 2 with CKD V. - Likely ATN due to concurrent illness. Recent worsening due to hypotension prior to intubation -Renal function improving, Cr down to 3.29 this AM, UOP 3.2 liters over the past 24 hours.  Continue to monitor closely.  No indication for HD at present.  2. Acute respiratory failure secondary to bilateral pneumonia and Pulm edema -  Pt extubated 11/13/14, doing well thus far, breathing comfortably, probable transition to floor care today.   3. Hypertension  -BP 188/73, pt now on amlodipine and hydralazine.  4. Hypokalemia:  Give KCL 62meq IV x one today.       LOS: 7 LATEEF, MUNSOOR 9/20/20168:48 AM

## 2014-11-14 NOTE — Progress Notes (Signed)
Most recent FSBS 68.  4 oz juice given.  Will continue to monitor.

## 2014-11-14 NOTE — Progress Notes (Addendum)
[name] Progess Note    ASSESSMENT/PLAN    69 yo white female with acute and progressive hypoxic resp failure From pneumonia with progressive renal failure  PULMONARY A: -Respiratory Failure due to pneumonia -doing better today, the patient was extubated today. She continues to do well today. -continue Bronchodilator Therapy   CXR reviewed; improvement in patchy airspace disease.   CARDIOVASCULAR -continue icu monitoring  RENAL A:  AKI, nonoliguric  Monitor BMET intermittently Monitor I/Os Correct electrolytes as indicated Avoid nephrotoxins   GASTROINTESTINAL -OG in place, started TF's  HEMATOLOGIC A:  Anemia, unclear etiology. No overt bleeding presently P:  DVT px: SCDs Monitor CBC intermittently   INFECTIOUS Pneumonia -continue abx as prescribed  ENDOCRINE A:  DM2, improved control Weaned off insulin drip yesterday. The patient was converted to long-acting insulin. Glucose control is now improved today.  NEUROLOGIC --Awake and alert this morning on the vent.     ADMISSION DATE: 11/07/2014  INITIAL PRESENTATION:  49 F initially seen as outpt one week PTA with complaints of weakness, dyspnea, cough and treated for PNA as outpt with levofloxacin. Was intolerant of this medication due to nausea and was changed to amoxicillin 4 days PTA. Seen in ED 9/13 with worsening dyspnea and admitted with dx of acute hypoxic resp failure due to CAP  MAJOR EVENTS/TEST RESULTS: 9/13 Admitted with above history. Transferred one unit PRBCs for Hgb 6.8.  9/14 Nephrology consultation 9/14 GI consultation. No indication for endoscopy 9/14 PCCM consultation for worsening hypoxic failure. CXR deemed c/w CAP 9/15 Pt improved with less dyspnea. Cr improving 9/16 Worsening hypoxemia. CXR c/w edema. Furosemide gtt initiated by Renal service. Cycling between BiPAP and high flow O2. Intermittently agitated. Low dose lorazepam ordered 9/16  worsening resp status-intubated,sedated 9/17-9/18 wean fio2 as tolerated  INDWELLING DEVICES:: RT IJ CVL>>9/16   MICRO DATA: MRSA PCR 9/13 >> NEG C diff PCR 9/14 >> NEG Blood 9/13 >>   ANTIMICROBIALS:  Pip-tazo 9/13 >> 9/14 Vanc 9/13 >> 9/16 Azithromycin 9/13 >>  Ceftriaxone 9/14 >>  ---------------------------------------   ----------------------------------------   Name: Felicia Vincent MRN: [id number] DOB: 11-Oct-1945    ADMISSION DATE:  11/07/2014    CHIEF COMPLAINT:  Dyspnea   STUDIES:  Chest x-ray improved as above   SUBJECTIVE:  Patient feeling and looking better today. No new complaints.  Review of Systems:  Constitutional: Feels well. Cardiovascular: No chest pain.  Pulmonary: Denies dyspnea.   The remainder of systems were reviewed and were found to be negative other than what is documented in the HPI.    VITAL SIGNS: Temp:  [97.9 F (36.6 C)-98.4 F (36.9 C)] 97.9 F (36.6 C) (09/20 0808) Pulse Rate:  [63-78] 65 (09/20 0900) Resp:  [14-25] 25 (09/20 0900) BP: (156-206)/(56-91) 180/61 mmHg (09/20 0900) SpO2:  [87 %-97 %] 94 % (09/20 0900) Weight:  [90.5 kg (199 lb 8.3 oz)] 90.5 kg (199 lb 8.3 oz) (09/20 0800) HEMODYNAMICS:   VENTILATOR SETTINGS:   INTAKE / OUTPUT:  Intake/Output Summary (Last 24 hours) at 11/14/14 0909 Last data filed at 11/14/14 0800  Gross per 24 hour  Intake    409 ml  Output   4200 ml  Net  -3791 ml    PHYSICAL EXAMINATION: Physical Examination:   VS: BP 180/61 mmHg  Pulse 65  Temp(Src) 97.9 F (36.6 C) (Oral)  Resp 25  Ht 5\' 1"  (1.549 m)  Wt 90.5 kg (199 lb 8.3 oz)  BMI 37.72 kg/m2  SpO2 94%  General Appearance: No distress  Neuro:without focal findings, mental status normal. HEENT: PERRLA, EOM intact. Pulmonary: normal breath sounds   CardiovascularNormal S1,S2.  No m/r/g.   Abdomen: Benign, Soft, non-tender. Renal:  No costovertebral tenderness  GU:  Not performed at this time. Endocrine: No  evident thyromegaly. Skin:   warm, no rashes, no ecchymosis  Extremities: normal, no cyanosis, clubbing.   LABS:   LABORATORY PANEL:   CBC  Recent Labs Lab 11/14/14 0500  WBC 19.8*  HGB 9.3*  HCT 27.8*  PLT 372    Chemistries   Recent Labs Lab 11/07/14 1230  11/10/14 1302  11/11/14 0439  11/14/14 0500  NA 126*  < > 138  < > 137  < > 142  K 5.6*  < > 4.2  < > 3.6  < > 3.1*  CL 93*  < > 106  < > 105  < > 103  CO2 23  < > 25  < > 21*  < > 27  GLUCOSE 575*  < > 205*  < > 349*  < > 62*  BUN 71*  < > 41*  < > 61*  < > 74*  CREATININE 4.63*  < > 2.75*  < > 3.21*  < > 3.29*  CALCIUM 7.9*  < > 8.3*  < > 8.1*  < > 8.7*  MG  --   --  1.6*  --   --   --   --   PHOS  --   --   --   --  3.8  --   --   AST 12*  --   --   --   --   --   --   ALT 10*  --   --   --   --   --   --   ALKPHOS 78  --   --   --   --   --   --   BILITOT 0.9  --   --   --   --   --   --   < > = values in this interval not displayed.   Recent Labs Lab 11/13/14 1327 11/13/14 1608 11/13/14 2002 11/14/14 0011 11/14/14 0401 11/14/14 0735  GLUCAP 186* 293* 239* 178* 68 74    Recent Labs Lab 11/11/14 0830 11/13/14 0905 11/14/14 0434  PHART 7.36 7.37 7.49*  PCO2ART 36 43 39  PO2ART 50* 82* 63*    Recent Labs Lab 11/07/14 1230 11/11/14 0439  AST 12*  --   ALT 10*  --   ALKPHOS 78  --   BILITOT 0.9  --   ALBUMIN 2.4* 1.7*    Cardiac Enzymes  Recent Labs Lab 11/07/14 1230  TROPONINI <0.03    RADIOLOGY:  Dg Chest 1 View  11/14/2014   CLINICAL DATA:  Dyspnea.  EXAM: CHEST  1 VIEW  COMPARISON:  11/13/2014 and 11/10/2014  FINDINGS: Right IJ central venous catheter with tip just below the cavoatrial junction without significant change. Interval removal of nasogastric tube and endotracheal tube.  Lungs are adequately inflated demonstrate persistent bilateral patchy airspace opacification which may be slightly worse over the right upper lobe compared to 11/13/2014, although greatly  improved compared to 11/10/2014. No evidence of effusion. Mild stable cardiomegaly. Remainder of the exam is unchanged.  IMPRESSION: Patchy bilateral airspace process with marked improvement compared to 11/10/2014.  Stable cardiomegaly.  Right IJ central venous catheter with tip just below the cavoatrial junction.  Electronically Signed   By: Marin Olp M.D.   On: 11/14/2014 08:13   Dg Chest Port 1 View  11/13/2014   CLINICAL DATA:  Followup acute hypoxic respiratory failure secondary to bilateral pneumonia. Cough. Now possible CHF.  EXAM: PORTABLE CHEST - 1 VIEW  COMPARISON:  11/11/2014  FINDINGS: Support devices remain in stable position. Improving aeration of the lungs with decreasing bilateral airspace disease. Cardiomegaly. No visible effusions. No acute bony abnormality.  IMPRESSION: Bilateral airspace opacities have improved, likely improving edema and/or pneumonia.   Electronically Signed   By: Rolm Baptise M.D.   On: 11/13/2014 08:57       --Marda Stalker, MD.  Pager [phone] [facility] Office Number: [phone]  Patricia Pesa, M.D.  Vilinda Boehringer, M.D.  Merton Border, M.D

## 2014-11-14 NOTE — Evaluation (Signed)
Physical Therapy Evaluation Patient Details Name: Felicia Vincent MRN: [id number] DOB: 12/13/1945 Today's Date: 11/14/2014   History of Present Illness  Pt is a 69 y.o. female presenting to hospital with SOB and placed on BiPAP.  Pt admitted to hospital with B PNA with acute and progressive hypoxic respiratory failure from PNA with progressive renal failure.  Pt s/p 1 unit PRBC's d/t decrease in Hgb.  Pt transitioned to high flow nasal cannula but was emergently intubated 9/16 and extubated 11/13/14.  PMH includes HTN, DM type 2, chronic kidney disease, h/o previous CVA, TIA.  Clinical Impression  Currently pt demonstrates impairments with activity tolerance and limitations with functional mobility and respiratory status.  Prior to admission, pt was independent without AD (except does not drive).  Pt's son lives with her in 1 level home with ramp to enter.  Currently pt demonstrates increased work of breathing and SOB with LE ex's in bed (pt desaturated to 89% on 2 L/min via nasal cannula and required vc's for purse-lip breathing to increase O2 sats back to 92% or above).  D/t pt's respiratory status appearing challenged with general LE bed ex's, pt did not appear appropriate to attempt more strenuous OOB mobility this afternoon.  Pt would benefit from skilled PT to address above noted impairments and functional limitations.  D/t respiratory status/impaired activity tolerance, anticipate pt may need STR, but will further assess pt next session for updated recommendations.     Follow Up Recommendations Other (comment) (Anticipate STR but pending further assessment)    Equipment Recommendations  Other (comment) (To be determined)    Recommendations for Other Services       Precautions / Restrictions Precautions Precautions: Fall Precaution Comments: rectal tube Restrictions Weight Bearing Restrictions: No      Mobility  Bed Mobility               General bed mobility comments:  Deferred OOB mobility d/t increased work of breathing and SOB with LE ex's in bed  Transfers                    Ambulation/Gait                Stairs            Wheelchair Mobility    Modified Rankin (Stroke Patients Only)       Balance                                             Pertinent Vitals/Pain Pain Assessment: No/denies pain  See O2 vitals above; HR 60-70's during session    Home Living Family/patient expects to be discharged to:: Private residence Living Arrangements: Children (pt's son lives with pt) Available Help at Discharge: Family Type of Home: House Home Access: Ramped entrance     Home Layout: One level Home Equipment: None      Prior Function Level of Independence: Independent         Comments: pt does not drive     Hand Dominance        Extremity/Trunk Assessment   Upper Extremity Assessment: Generalized weakness           Lower Extremity Assessment: Generalized weakness         Communication   Communication: No difficulties  Cognition Arousal/Alertness: Awake/alert Behavior During Therapy: WFL for tasks assessed/performed Overall  Cognitive Status: Within Functional Limits for tasks assessed                      General Comments   Nursing cleared pt for participation in physical therapy.  Pt agreeable to PT session.  Pt's grandson present during session.    Exercises   Performed semi-supine B LE therapeutic exercise x 10 reps:  Ankle pumps (AROM B LE's); quad sets x3 second holds (AROM B LE's); glute squeezes x3 second holds (AROM B); SAQ's (AROM R; AROM L); heelslides (AROM R; AROM L), hip abd/adduction (AROM R; AROM L).  Pt required vc's and tactile cues for correct technique with exercises.       Assessment/Plan    PT Assessment Patient needs continued PT services  PT Diagnosis Generalized weakness   PT Problem List Decreased activity tolerance;Decreased  mobility;Cardiopulmonary status limiting activity  PT Treatment Interventions DME instruction;Gait training;Functional mobility training;Therapeutic activities;Therapeutic exercise;Balance training;Patient/family education   PT Goals (Current goals can be found in the Care Plan section) Acute Rehab PT Goals Patient Stated Goal: To be able to walk again PT Goal Formulation: With patient Time For Goal Achievement: 11/28/14 Potential to Achieve Goals: Good    Frequency Min 2X/week   Barriers to discharge        Co-evaluation               End of Session Equipment Utilized During Treatment: Oxygen Activity Tolerance: Other (comment) (limited d/t SOB and increased work of breathing with LE ex's in bed) Patient left: in bed;with call bell/phone within reach;with bed alarm set;with family/visitor present           Time: 1550-1615 PT Time Calculation (min) (ACUTE ONLY): 25 min   Charges:   PT Evaluation $Initial PT Evaluation Tier I: 1 Procedure PT Treatments $Therapeutic Exercise: 8-22 mins   PT G CodesLeitha Bleak 29-Nov-2014, 4:30 PM Leitha Bleak, [place]

## 2014-11-14 NOTE — Progress Notes (Signed)
[facility] NAME: Felicia Vincent    MR#:  [id number]  DATE OF BIRTH:  1946-01-27  SUBJECTIVE:  Extubated y'day. Doing well. Ate some in am family at bedisde REVIEW OF SYSTEMS:   Review of Systems  Unable to perform ROS: intubated  Constitutional: Negative for fever, chills and weight loss.  HENT: Negative for ear discharge, ear pain and nosebleeds.   Eyes: Negative for blurred vision, pain and discharge.  Respiratory: Positive for cough, sputum production and shortness of breath. Negative for wheezing and stridor.   Cardiovascular: Negative for chest pain, palpitations, orthopnea and PND.  Gastrointestinal: Negative for nausea, vomiting, abdominal pain and diarrhea.  Genitourinary: Negative for urgency and frequency.  Musculoskeletal: Negative for back pain and joint pain.  Neurological: Positive for weakness. Negative for sensory change, speech change and focal weakness.  Psychiatric/Behavioral: Negative for depression. The patient is not nervous/anxious.   All other systems reviewed and are negative.  DRUG ALLERGIES:   Allergies  Allergen Reactions  . Codeine Nausea Only  . Prednisone Other (See Comments)    Pts daughter states that this medication "makes her evil".      VITALS:  Blood pressure 180/61, pulse 65, temperature 97.9 F (36.6 C), temperature source Oral, resp. rate 25, height 5\' 1"  (1.549 m), weight 90.5 kg (199 lb 8.3 oz), SpO2 94 %.  PHYSICAL EXAMINATION:   Physical Exam  GENERAL:  69 y.o.-year-old patient lying in the bed with no acute distress. critically ill EYES: Pupils equal, round, reactive to light and accommodation. No scleral icterus. Extraocular muscles intact.  HEENT: Head atraumatic, normocephalic. Oropharynx and nasopharynx clear. Intubated and on the vent NECK:  Supple, no jugular venous distention. No thyroid enlargement, no tenderness.  LUNGS: decreased breath sounds bilaterally, no  wheezing, rales, no rhonchi. No use of accessory muscles of respiration.  CARDIOVASCULAR: S1, S2 normal. No murmurs, rubs, or gallops.  ABDOMEN: Soft, nontender, nondistended. Bowel sounds present. No organomegaly or mass.  EXTREMITIES: No cyanosis, clubbing or edema b/l.    NEUROLOGIC: overall grossly non focal. Weak PSYCHIATRIC: alert and Ox3 SKIN: No obvious rash, lesion, or ulcer.   LABORATORY PANEL:   CBC  Recent Labs Lab 11/14/14 0500  WBC 19.8*  HGB 9.3*  HCT 27.8*  PLT 372    Chemistries   Recent Labs Lab 11/10/14 1302  11/14/14 0500  NA 138  < > 142  K 4.2  < > 3.1*  CL 106  < > 103  CO2 25  < > 27  GLUCOSE 205*  < > 62*  BUN 41*  < > 74*  CREATININE 2.75*  < > 3.29*  CALCIUM 8.3*  < > 8.7*  MG 1.6*  --   --   < > = values in this interval not displayed.  Cardiac Enzymes No results for input(s): TROPONINI in the last 168 hours.  RADIOLOGY:  Dg Chest 1 View  11/14/2014   CLINICAL DATA:  Dyspnea.  EXAM: CHEST  1 VIEW  COMPARISON:  11/13/2014 and 11/10/2014  FINDINGS: Right IJ central venous catheter with tip just below the cavoatrial junction without significant change. Interval removal of nasogastric tube and endotracheal tube.  Lungs are adequately inflated demonstrate persistent bilateral patchy airspace opacification which may be slightly worse over the right upper lobe compared to 11/13/2014, although greatly improved compared to 11/10/2014. No evidence of effusion. Mild stable cardiomegaly. Remainder of the exam is unchanged.  IMPRESSION: Patchy bilateral airspace  process with marked improvement compared to 11/10/2014.  Stable cardiomegaly.  Right IJ central venous catheter with tip just below the cavoatrial junction.   Electronically Signed   By: Marin Olp M.D.   On: 11/14/2014 08:13   Dg Chest Port 1 View  11/13/2014   CLINICAL DATA:  Followup acute hypoxic respiratory failure secondary to bilateral pneumonia. Cough. Now possible CHF.  EXAM: PORTABLE  CHEST - 1 VIEW  COMPARISON:  11/11/2014  FINDINGS: Support devices remain in stable position. Improving aeration of the lungs with decreasing bilateral airspace disease. Cardiomegaly. No visible effusions. No acute bony abnormality.  IMPRESSION: Bilateral airspace opacities have improved, likely improving edema and/or pneumonia.   Electronically Signed   By: Rolm Baptise M.D.   On: 11/13/2014 08:57     ASSESSMENT AND PLAN:   69 year old with bilateral pneumonia  1.  acute hypoxic respiratory failure secondary to bilateral pneumonia: and now with possible CHF acute systolic/diastolic continue with  Ceftriaxone, azithromycin Chest x-rays with persistent bilateral infiltrates now concern for CHF  -pt was on IV Lasix therapy, good uop, repeat cxr today shows improvement in infiltrates-->c ahgne to po lasix 20 mg daily appreciate cardiology and nephrology input  creatinine improvong  2. Acute renal failure on chronic renal failure due to dehydration: Baseline seems to be at 3.0 Now off IV lasix gtt  avoid nephrotoxins Received Kayexalate for hyperkalemia  3. Hypertension continue imdur and hydralazine  -resumed other home meds  4. Diabetes type 2 with hyperglycemia  -was on insulin gtt-->levemir and ssi  5. History of CVA continue aspirin  6. Diarrhea C. difficile toxin is negative  7.   DVT prophylaxis heparin  8. Anemia of chornic illness with heme positive stools -hgb trending up after 1 unit BT -GI w/u at later date  Case discussed with Care Management/Social Worker.  CODE STATUS: full  DVT Prophylaxis: heparin  TOTAL TIME TAKING CARE OF THIS PATIENT: 6minutes.  >50% time spent on counselling and coordination of care Dr Juanell Fairly, pt's dter   PATEL,SONA M.D on 11/14/2014 at 1:50 PM  Between 7am to 6pm - Pager - [phone]  After 6pm go to [email or web address] - password [email or web address] [facility]  Office  [phone]  CC: Primary care physician; Lavera Guise, MD

## 2014-11-14 NOTE — Progress Notes (Signed)
Pt tx 2A per MD order, pt VSS, pt verbalized understanding of tx, all questions answered, ptm foley d/c at 1425, report called to receiving RN, updates given all questions answered, nursing will cont to monitor

## 2014-11-14 NOTE — Progress Notes (Signed)
Inpatient Diabetes Program Recommendations  AACE/ADA: New Consensus Statement on Inpatient Glycemic Control (2015)  Target Ranges:  Prepandial:   less than 140 mg/dL      Peak postprandial:   less than 180 mg/dL (1-2 hours)      Critically ill patients:  140 - 180 mg/dL   Results for Felicia Vincent, Felicia Vincent (MRN [id number]) as of 11/14/2014 07:40  Ref. Range 11/13/2014 16:08 11/13/2014 20:02 11/14/2014 00:11 11/14/2014 04:01 11/14/2014 07:35  Glucose-Capillary Latest Ref Range: 65-99 mg/dL 293 (H) 239 (H) 178 (H) 68 74    Review of Glycemic Control  Home DM Meds: Levemir 30-35 units QHS  Amaryl 2 mg bid  Victoza 1.8 mg daily  Current DM Orders: Lantus 20 units bid                                  Novolog 0-15 units q4h                                  Amaryl 2mg  bid  Now that the patient has been ordered a diet, please consider changing her Novolog to tid and hs.  Please consider obtaining an A1C to determine blood sugar control. This will assist in determining discharge meds.    [name], RN, BA, MHA, CDE Diabetes Coordinator Inpatient Diabetes Program  [phone] (Team Pager) [phone] ([place]) 11/14/2014 7:43 AM

## 2014-11-15 LAB — CBC
HCT: 27.6 % — ABNORMAL LOW (ref 35.0–47.0)
Hemoglobin: 9.1 g/dL — ABNORMAL LOW (ref 12.0–16.0)
MCH: 27.8 pg (ref 26.0–34.0)
MCHC: 33 g/dL (ref 32.0–36.0)
MCV: 84.3 fL (ref 80.0–100.0)
PLATELETS: 287 10*3/uL (ref 150–440)
RBC: 3.27 MIL/uL — ABNORMAL LOW (ref 3.80–5.20)
RDW: 16.4 % — AB (ref 11.5–14.5)
WBC: 12.5 10*3/uL — ABNORMAL HIGH (ref 3.6–11.0)

## 2014-11-15 LAB — GLUCOSE, CAPILLARY
GLUCOSE-CAPILLARY: 141 mg/dL — AB (ref 65–99)
GLUCOSE-CAPILLARY: 199 mg/dL — AB (ref 65–99)
Glucose-Capillary: 351 mg/dL — ABNORMAL HIGH (ref 65–99)
Glucose-Capillary: 515 mg/dL — ABNORMAL HIGH (ref 65–99)
Glucose-Capillary: 389 mg/dL — ABNORMAL HIGH (ref 65–99)

## 2014-11-15 LAB — BASIC METABOLIC PANEL
Anion gap: 5 (ref 5–15)
BUN: 98 mg/dL — ABNORMAL HIGH (ref 6–20)
CALCIUM: 8 mg/dL — AB (ref 8.9–10.3)
CO2: 29 mmol/L (ref 22–32)
CREATININE: 3.13 mg/dL — AB (ref 0.44–1.00)
Chloride: 104 mmol/L (ref 101–111)
GFR, EST AFRICAN AMERICAN: 16 mL/min — AB (ref >60)
GFR, EST NON AFRICAN AMERICAN: 14 mL/min — AB (ref >60)
Glucose, Bld: 156 mg/dL — ABNORMAL HIGH (ref 65–99)
Potassium: 4 mmol/L (ref 3.5–5.1)
SODIUM: 138 mmol/L (ref 135–145)

## 2014-11-15 MED ORDER — LOPERAMIDE HCL 2 MG PO CAPS: 2.0000 mg | ORAL_CAPSULE | ORAL | Status: DC | PRN

## 2014-11-15 MED ORDER — INSULIN ASPART 100 UNIT/ML ~~LOC~~ SOLN
0.0000 [IU] | Freq: Three times a day (TID) | SUBCUTANEOUS | Status: DC
  Administered 2014-11-16: 2 [IU] via SUBCUTANEOUS
  Administered 2014-11-16: 3 [IU] via SUBCUTANEOUS
  Administered 2014-11-17: 8 [IU] via SUBCUTANEOUS
  Filled 2014-11-15: qty 8
  Filled 2014-11-15: qty 2
  Filled 2014-11-15: qty 3

## 2014-11-15 MED ORDER — INSULIN ASPART 100 UNIT/ML ~~LOC~~ SOLN
0.0000 [IU] | Freq: Every day | SUBCUTANEOUS | Status: DC
  Administered 2014-11-15: 5 [IU] via SUBCUTANEOUS
  Administered 2014-11-16: 4 [IU] via SUBCUTANEOUS
  Filled 2014-11-15: qty 5
  Filled 2014-11-15: qty 4

## 2014-11-15 MED ORDER — INSULIN ASPART 100 UNIT/ML ~~LOC~~ SOLN
15.0000 [IU] | Freq: Once | SUBCUTANEOUS | Status: AC
  Administered 2014-11-15: 15 [IU] via SUBCUTANEOUS
  Filled 2014-11-15: qty 15

## 2014-11-15 MED ORDER — LOPERAMIDE HCL 2 MG PO CAPS
2.0000 mg | ORAL_CAPSULE | Freq: Two times a day (BID) | ORAL | Status: DC
  Administered 2014-11-15 – 2014-11-17 (×5): 2 mg via ORAL
  Filled 2014-11-15 (×5): qty 1

## 2014-11-15 NOTE — Care Management (Addendum)
Patient transferred from icu to 2A.  She was extubated yesterday.  Prior to this admission, patient was independent in all adls.  Her lives lives in the same home.  Daughter Laurelyn Sickle is adamant that patient will not discharge to a facility.  Discussed care demands and Shirlean Mylar who is a Marine scientist, verbalizes that patient is going to require much assistance initially when discharges home.  Agency preference is [facility] as agency provided service to patient's husband.  Shirlean Mylar denies need of any home DME- such as hospital bed- BSC, walker, wheelchair.  Patient still has liquid stool coming from the rectal tube- thought to be from tube feedings.  Daughter says it is from the IV antibiotics.

## 2014-11-15 NOTE — Progress Notes (Signed)
[facility] Progess Note    ASSESSMENT/PLAN    69 yo white female with acute and progressive hypoxic resp failure From pneumonia.  PULMONARY A: -Respiratory Failure due to pneumonia -doing better today, O2 sat is now 94% on 1 L nasal cannula. -continue Bronchodilator Therapy -wean down steroids. -Currently on antibiotics. Would favor shorter course of 5-7 days. -Advance activity as tolerated, patient can likely discharge soon. From respiratory standpoint.    ADMISSION DATE: 11/07/2014  INITIAL PRESENTATION:  7 F initially seen as outpt one week PTA with complaints of weakness, dyspnea, cough and treated for PNA as outpt with levofloxacin. Was intolerant of this medication due to nausea and was changed to amoxicillin 4 days PTA. Seen in ED 9/13 with worsening dyspnea and admitted with dx of acute hypoxic resp failure due to CAP   ---------------------------------------   ----------------------------------------   Name: Felicia Vincent MRN: [id number] DOB: 1945-07-09    ADMISSION DATE:  11/07/2014    CHIEF COMPLAINT:  Dyspnea     SUBJECTIVE:  Patient feeling and looking better today. No new complaints.  Review of Systems:  Constitutional: Feels well. Cardiovascular: No chest pain.  Pulmonary: Denies dyspnea.   The remainder of systems were reviewed and were found to be negative other than what is documented in the HPI.    VITAL SIGNS: Temp:  [97.6 F (36.4 C)-98.4 F (36.9 C)] 97.6 F (36.4 C) (09/21 1209) Pulse Rate:  [58-64] 58 (09/21 1209) Resp:  [20-24] 20 (09/21 1209) BP: (162-181)/(53-66) 167/53 mmHg (09/21 1209) SpO2:  [89 %-97 %] 90 % (09/21 1209) Weight:  [87.363 kg (192 lb 9.6 oz)-87.68 kg (193 lb 4.8 oz)] 87.363 kg (192 lb 9.6 oz) (09/21 0436) HEMODYNAMICS:   VENTILATOR SETTINGS:   INTAKE / OUTPUT:  Intake/Output Summary (Last 24 hours) at 11/15/14 1317 Last data filed at 11/15/14 1300  Gross per 24 hour    Intake    600 ml  Output    800 ml  Net   -200 ml    PHYSICAL EXAMINATION: Physical Examination:   VS: BP 167/53 mmHg  Pulse 58  Temp(Src) 97.6 F (36.4 C) (Oral)  Resp 20  Ht 5\' 1"  (1.549 m)  Wt 87.363 kg (192 lb 9.6 oz)  BMI 36.41 kg/m2  SpO2 90%  General Appearance: No distress  Neuro:without focal findings, mental status normal. HEENT: PERRLA, EOM intact. Pulmonary: normal breath sounds   CardiovascularNormal S1,S2.  No m/r/g.   Abdomen: Benign, Soft, non-tender. Renal:  No costovertebral tenderness  GU:  Not performed at this time. Endocrine: No evident thyromegaly. Skin:   warm, no rashes, no ecchymosis  Extremities: normal, no cyanosis, clubbing.   LABS:   LABORATORY PANEL:   CBC  Recent Labs Lab 11/15/14 0445  WBC 12.5*  HGB 9.1*  HCT 27.6*  PLT 287    Chemistries   Recent Labs Lab 11/10/14 1302  11/11/14 0439  11/15/14 0445  NA 138  < > 137  < > 138  K 4.2  < > 3.6  < > 4.0  CL 106  < > 105  < > 104  CO2 25  < > 21*  < > 29  GLUCOSE 205*  < > 349*  < > 156*  BUN 41*  < > 61*  < > 98*  CREATININE 2.75*  < > 3.21*  < > 3.13*  CALCIUM 8.3*  < > 8.1*  < > 8.0*  MG 1.6*  --   --   --   --  PHOS  --   --  3.8  --   --   < > = values in this interval not displayed.   Recent Labs Lab 11/14/14 1126 11/14/14 1656 11/14/14 2057 11/14/14 2353 11/15/14 0735 11/15/14 1210  GLUCAP 140* 119* 138* 223* 141* 199*    Recent Labs Lab 11/11/14 0830 11/13/14 0905 11/14/14 0434  PHART 7.36 7.37 7.49*  PCO2ART 36 43 39  PO2ART 50* 82* 63*    Recent Labs Lab 11/11/14 0439  ALBUMIN 1.7*    Cardiac Enzymes No results for input(s): TROPONINI in the last 168 hours.  RADIOLOGY:  Dg Chest 1 View  11/14/2014   CLINICAL DATA:  Dyspnea.  EXAM: CHEST  1 VIEW  COMPARISON:  11/13/2014 and 11/10/2014  FINDINGS: Right IJ central venous catheter with tip just below the cavoatrial junction without significant change. Interval removal of nasogastric  tube and endotracheal tube.  Lungs are adequately inflated demonstrate persistent bilateral patchy airspace opacification which may be slightly worse over the right upper lobe compared to 11/13/2014, although greatly improved compared to 11/10/2014. No evidence of effusion. Mild stable cardiomegaly. Remainder of the exam is unchanged.  IMPRESSION: Patchy bilateral airspace process with marked improvement compared to 11/10/2014.  Stable cardiomegaly.  Right IJ central venous catheter with tip just below the cavoatrial junction.   Electronically Signed   By: Marin Olp M.D.   On: 11/14/2014 08:13       --Marda Stalker, MD.  Pager [phone] [facility] Office Number: [phone]  Patricia Pesa, M.D.  Vilinda Boehringer, M.D.  Merton Border, M.D

## 2014-11-15 NOTE — Progress Notes (Signed)
[facility] [place] NAME: Felicia Vincent    MR#:  [id number]  DATE OF BIRTH:  25-Aug-1945  SUBJECTIVE:  Doing well. Ate some in am family at bedside. Still has rectal output REVIEW OF SYSTEMS:   Review of Systems  Unable to perform ROS: intubated  Constitutional: Negative for fever, chills and weight loss.  HENT: Negative for ear discharge, ear pain and nosebleeds.   Eyes: Negative for blurred vision, pain and discharge.  Respiratory: Positive for cough, sputum production and shortness of breath. Negative for wheezing and stridor.   Cardiovascular: Negative for chest pain, palpitations, orthopnea and PND.  Gastrointestinal: Negative for nausea, vomiting, abdominal pain and diarrhea.  Genitourinary: Negative for urgency and frequency.  Musculoskeletal: Negative for back pain and joint pain.  Neurological: Positive for weakness. Negative for sensory change, speech change and focal weakness.  Psychiatric/Behavioral: Negative for depression. The patient is not nervous/anxious.   All other systems reviewed and are negative.  DRUG ALLERGIES:   Allergies  Allergen Reactions  . Codeine Nausea Only  . Prednisone Other (See Comments)    Pts daughter states that this medication "makes her evil".      VITALS:  Blood pressure 167/53, pulse 58, temperature 97.6 F (36.4 C), temperature source Oral, resp. rate 20, height 5\' 1"  (1.549 m), weight 87.363 kg (192 lb 9.6 oz), SpO2 90 %.  PHYSICAL EXAMINATION:   Physical Exam  GENERAL:  69 y.o.-year-old patient lying in the bed with no acute distress. critically ill EYES: Pupils equal, round, reactive to light and accommodation. No scleral icterus. Extraocular muscles intact.  HEENT: Head atraumatic, normocephalic. Oropharynx and nasopharynx clear. Intubated and on the vent NECK:  Supple, no jugular venous distention. No thyroid enlargement, no tenderness.  LUNGS: decreased breath sounds  bilaterally, no wheezing, rales, no rhonchi. No use of accessory muscles of respiration.  CARDIOVASCULAR: S1, S2 normal. No murmurs, rubs, or gallops.  ABDOMEN: Soft, nontender, nondistended. Bowel sounds present. No organomegaly or mass.  EXTREMITIES: No cyanosis, clubbing or edema b/l.    NEUROLOGIC: overall grossly non focal. Weak PSYCHIATRIC: alert and Ox3 SKIN: No obvious rash, lesion, or ulcer.   LABORATORY PANEL:   CBC  Recent Labs Lab 11/15/14 0445  WBC 12.5*  HGB 9.1*  HCT 27.6*  PLT 287    Chemistries   Recent Labs Lab 11/10/14 1302  11/15/14 0445  NA 138  < > 138  K 4.2  < > 4.0  CL 106  < > 104  CO2 25  < > 29  GLUCOSE 205*  < > 156*  BUN 41*  < > 98*  CREATININE 2.75*  < > 3.13*  CALCIUM 8.3*  < > 8.0*  MG 1.6*  --   --   < > = values in this interval not displayed.  Cardiac Enzymes No results for input(s): TROPONINI in the last 168 hours.  RADIOLOGY:  Dg Chest 1 View  11/14/2014   CLINICAL DATA:  Dyspnea.  EXAM: CHEST  1 VIEW  COMPARISON:  11/13/2014 and 11/10/2014  FINDINGS: Right IJ central venous catheter with tip just below the cavoatrial junction without significant change. Interval removal of nasogastric tube and endotracheal tube.  Lungs are adequately inflated demonstrate persistent bilateral patchy airspace opacification which may be slightly worse over the right upper lobe compared to 11/13/2014, although greatly improved compared to 11/10/2014. No evidence of effusion. Mild stable cardiomegaly. Remainder of the exam is unchanged.  IMPRESSION: Patchy  bilateral airspace process with marked improvement compared to 11/10/2014.  Stable cardiomegaly.  Right IJ central venous catheter with tip just below the cavoatrial junction.   Electronically Signed   By: Marin Olp M.D.   On: 11/14/2014 08:13   ASSESSMENT AND PLAN:   69 year old with bilateral pneumonia  1.  acute hypoxic respiratory failure secondary to bilateral pneumonia: and now with  possible CHF acute systolic/diastolic continue with  Ceftriaxone, azithromycin Chest x-rays with persistent bilateral infiltrates now concern for CHF  -pt was on IV Lasix therapy, good uop, repeat cxr today shows improvement in infiltrates-->c ahgne to po lasix 20 mg daily appreciate cardiology and nephrology input  creatinine improving  2. Acute renal failure on chronic renal failure due to dehydration: Baseline seems to be at 3.0 Now off IV lasix gtt  avoid nephrotoxins Received Kayexalate for hyperkalemia  3. Hypertension continue imdur and hydralazine  -resumed other home meds  4. Diabetes type 2 with hyperglycemia  -was on insulin gtt-->levemir and ssi  5. History of CVA continue aspirin  6. Diarrhea. Still has rectal tube. Consider lomotil to see if it helps slow down rectal ouput C. difficile toxin is negative  7.   DVT prophylaxis heparin  8. Anemia of chornic illness with heme positive stools -hgb trending up after 1 unit BT -GI w/u at later date  Case discussed with Care Management/Social Worker.  CODE STATUS: full  DVT Prophylaxis: heparin  TOTAL TIME TAKING CARE OF THIS PATIENT: 83minutes.  >50% time spent on counselling and coordination of care Dr Juanell Fairly, pt's dter   PATEL,SONA M.D on 11/15/2014 at 2:07 PM  Between 7am to 6pm - Pager - [phone]  After 6pm go to [email or web address] - password [email or web address] [facility]  Office  [phone]  CC: Primary care physician; Lavera Guise, MD

## 2014-11-15 NOTE — Progress Notes (Signed)
Subjective:  Pt seen at bedside.  Cr slowly improving. Down to 3.1.  Breathing comfortably at rest. Does desat with movement however.   Objective:  Vital signs in last 24 hours:  Temp:  [97.6 F (36.4 C)-98.2 F (36.8 C)] 97.6 F (36.4 C) (09/21 1209) Pulse Rate:  [58-64] 58 (09/21 1209) Resp:  [20] 20 (09/21 1209) BP: (167-174)/(53-56) 167/53 mmHg (09/21 1209) SpO2:  [89 %-97 %] 91 % (09/21 1604) Weight:  [87.363 kg (192 lb 9.6 oz)] 87.363 kg (192 lb 9.6 oz) (09/21 0436)  Weight change:  Filed Weights   11/14/14 0800 11/14/14 1437 11/15/14 0436  Weight: 90.5 kg (199 lb 8.3 oz) 87.68 kg (193 lb 4.8 oz) 87.363 kg (192 lb 9.6 oz)    Intake/Output:    Intake/Output Summary (Last 24 hours) at 11/15/14 1636 Last data filed at 11/15/14 1500  Gross per 24 hour  Intake    240 ml  Output    800 ml  Net   -560 ml     Physical Exam: General: frail, elderly, no acute distress  HEENT Extubated, eyes open, OM moist  Neck supple  Pulm/lungs Scattered rhonchi, normal effort  CVS/Heart irregular rhythm, no rub or gallop  Abdomen:  soft, nontender, BS present  Extremities: Trace lower extremity edema  Neurologic: Awake, alert, following commands readily  Skin: No acute rashes    Foley present        Basic Metabolic Panel:   Recent Labs Lab 11/10/14 1302  11/11/14 0439 11/12/14 0856 11/13/14 0856 11/14/14 0500 11/15/14 0445  NA 138  < > 137 139 140 142 138  K 4.2  < > 3.6 3.4* 3.2* 3.1* 4.0  CL 106  < > 105 105 103 103 104  CO2 25  < > 21* 23 23 27 29   GLUCOSE 205*  < > 349* 148* 301* 62* 156*  BUN 41*  < > 61* 82* 98* 74* 98*  CREATININE 2.75*  < > 3.21* 3.62* 3.66* 3.29* 3.13*  CALCIUM 8.3*  < > 8.1* 8.8* 8.4* 8.7* 8.0*  MG 1.6*  --   --   --   --   --   --   PHOS  --   --  3.8  --   --   --   --   < > = values in this interval not displayed.   CBC:  Recent Labs Lab 11/10/14 0511 11/11/14 0543 11/11/14 0854 11/12/14 0614 11/13/14 0856 11/14/14 0500  11/15/14 0445  WBC 27.5* 17.7*  --   --  19.1* 19.8* 12.5*  HGB 8.5* 6.4* 6.5* 7.5* 8.8* 9.3* 9.1*  HCT 23.8* 19.3*  --   --  26.6* 27.8* 27.6*  MCV 98.3 86.9  --   --  84.8 84.7 84.3  PLT 372 335  --   --  356 372 287      Microbiology:  Recent Results (from the past 720 hour(s))  Culture, blood (routine x 2)     Status: None   Collection Time: 11/07/14 12:30 PM  Result Value Ref Range Status   Specimen Description BLOOD RIGHT ASSIST CONTROL  Final   Special Requests   Final    BOTTLES DRAWN AEROBIC AND ANAEROBIC  2CC AERO 1 CC ANAERO   Culture NO GROWTH 5 DAYS  Final   Report Status 11/12/2014 FINAL  Final  Culture, blood (routine x 2)     Status: None   Collection Time: 11/07/14 12:48 PM  Result Value Ref  Range Status   Specimen Description BLOOD LEFT FATTY CASTS  Final   Special Requests BOTTLES DRAWN AEROBIC AND ANAEROBIC  1CC  Final   Culture NO GROWTH 5 DAYS  Final   Report Status 11/12/2014 FINAL  Final  MRSA PCR Screening     Status: None   Collection Time: 11/07/14  6:06 PM  Result Value Ref Range Status   MRSA by PCR NEGATIVE NEGATIVE Final    Comment:        The GeneXpert MRSA Assay (FDA approved for NASAL specimens only), is one component of a comprehensive MRSA colonization surveillance program. It is not intended to diagnose MRSA infection nor to guide or monitor treatment for MRSA infections.   C difficile quick scan w PCR reflex     Status: None   Collection Time: 11/08/14  4:15 AM  Result Value Ref Range Status   C Diff antigen NEGATIVE NEGATIVE Final   C Diff toxin NEGATIVE NEGATIVE Final   C Diff interpretation Negative for C. difficile  Final    Coagulation Studies: No results for input(s): LABPROT, INR in the last 72 hours.  Urinalysis: No results for input(s): COLORURINE, LABSPEC, PHURINE, GLUCOSEU, HGBUR, BILIRUBINUR, KETONESUR, PROTEINUR, UROBILINOGEN, NITRITE, LEUKOCYTESUR in the last 72 hours.  Invalid input(s): APPERANCEUR     Imaging: Dg Chest 1 View  11/14/2014   CLINICAL DATA:  Dyspnea.  EXAM: CHEST  1 VIEW  COMPARISON:  11/13/2014 and 11/10/2014  FINDINGS: Right IJ central venous catheter with tip just below the cavoatrial junction without significant change. Interval removal of nasogastric tube and endotracheal tube.  Lungs are adequately inflated demonstrate persistent bilateral patchy airspace opacification which may be slightly worse over the right upper lobe compared to 11/13/2014, although greatly improved compared to 11/10/2014. No evidence of effusion. Mild stable cardiomegaly. Remainder of the exam is unchanged.  IMPRESSION: Patchy bilateral airspace process with marked improvement compared to 11/10/2014.  Stable cardiomegaly.  Right IJ central venous catheter with tip just below the cavoatrial junction.   Electronically Signed   By: Marin Olp M.D.   On: 11/14/2014 08:13     Medications:     . sodium chloride   Intravenous Once  . amLODipine  5 mg Oral QHS  . aspirin EC  81 mg Oral Daily  . atorvastatin  10 mg Oral QHS  . budesonide (PULMICORT) nebulizer solution  0.25 mg Nebulization BID  . cefUROXime  500 mg Oral BID WC  . chlorhexidine gluconate  15 mL Mouth Rinse BID  . citalopram  20 mg Oral QPM  . clopidogrel  75 mg Oral QHS  . donepezil  5 mg Oral Daily  . furosemide  20 mg Oral Daily  . Gerhardt's butt cream   Topical QID  . glimepiride  1 mg Oral Daily  . guaiFENesin  600 mg Oral BID  . heparin subcutaneous  5,000 Units Subcutaneous Q12H  . hydrALAZINE  50 mg Oral TID  . insulin aspart  0-15 Units Subcutaneous TID WC  . insulin detemir  20 Units Subcutaneous BID  . ipratropium-albuterol  3 mL Nebulization Q4H  . isosorbide mononitrate  90 mg Oral Daily  . loperamide  2 mg Oral Q12H  . pantoprazole  40 mg Oral Daily  . pneumococcal 23 valent vaccine  0.5 mL Intramuscular Tomorrow-1000  . [START ON 11/16/2014] predniSONE  10 mg Oral Once  . psyllium  1 packet Oral Daily  .  saccharomyces boulardii  250 mg Oral BID  .  sodium chloride  10-40 mL Intracatheter Q12H  . sotalol  120 mg Oral BID  . vitamin B-12  1,000 mcg Oral Daily   acetaminophen **OR** acetaminophen, benzonatate, guaiFENesin-dextromethorphan, hydrALAZINE, ondansetron **OR** ondansetron (ZOFRAN) IV, sodium chloride  Assessment/ Plan:  68 y.o. female with diabetic kidney disease, hypertension, diabetic retinopathy, bilateral cataracts, history of TIA and chronic kidney disease stage V is admitted for pneumonia  1. Acute renal failure on chronic kidney disease stage V/Diabetes mellitus type 2 with CKD V. - Likely ATN due to concurrent illness. Recent worsening due to hypotension prior to intubation -Cr slowly trending down, currently down to 3.1.  Good UOP noted.  No indication for HD.  Will monitor renal parameters.   2. Acute respiratory failure secondary to bilateral pneumonia and Pulm edema -  Currently on sat monitor at bedside, sat currently 92% on O2 at rest, does desat with movement however.    3. Hypertension  -BP 167/53, hesitant to titrate anything as diastolic already quite low.    4. Hypokalemia:  K up to 4.0 post repletion.         LOS: [place], Chancey Ringel 9/21/20164:36 PM

## 2014-11-15 NOTE — Progress Notes (Signed)
Patient alert and oriented, VSS, patient on 2LNC, no complaints of pain.  Family at bedside for majority of the day.   Patient up to the chair today, tolerating well.   Rectal tube bag changed at 1500.  Per Dr. Rayann Heman, start imodium and also d/c rectal tube in AM if output is minimal.

## 2014-11-15 NOTE — Progress Notes (Signed)
Patient was made an initial appointment at the outpatient [facility] on November 30, 2014 at 9:00am. Thank you.

## 2014-11-15 NOTE — Progress Notes (Signed)
GI Inpatient Follow-up Note  Patient Identification: Felicia Vincent is a 69 y.o. female with CAP requring intubation, now extubated and to floor, also now with diarrhea, prev drop in Hgb  Subjective:  Still liquidy outpt from rectal tube, but not well quantified.   On nasal canula. SOB improving.  No abd pain, n/v, rectal bleeding.   Scheduled Inpatient Medications:  . sodium chloride   Intravenous Once  . amLODipine  5 mg Oral QHS  . aspirin EC  81 mg Oral Daily  . atorvastatin  10 mg Oral QHS  . budesonide (PULMICORT) nebulizer solution  0.25 mg Nebulization BID  . cefUROXime  500 mg Oral BID WC  . chlorhexidine gluconate  15 mL Mouth Rinse BID  . citalopram  20 mg Oral QPM  . clopidogrel  75 mg Oral QHS  . donepezil  5 mg Oral Daily  . furosemide  20 mg Oral Daily  . Gerhardt's butt cream   Topical QID  . glimepiride  1 mg Oral Daily  . guaiFENesin  600 mg Oral BID  . heparin subcutaneous  5,000 Units Subcutaneous Q12H  . hydrALAZINE  50 mg Oral TID  . insulin aspart  0-15 Units Subcutaneous TID WC  . insulin detemir  20 Units Subcutaneous BID  . ipratropium-albuterol  3 mL Nebulization Q4H  . isosorbide mononitrate  90 mg Oral Daily  . loperamide  2 mg Oral Q12H  . pantoprazole  40 mg Oral Daily  . pneumococcal 23 valent vaccine  0.5 mL Intramuscular Tomorrow-1000  . [START ON 11/16/2014] predniSONE  10 mg Oral Once  . psyllium  1 packet Oral Daily  . saccharomyces boulardii  250 mg Oral BID  . sodium chloride  10-40 mL Intracatheter Q12H  . sotalol  120 mg Oral BID  . vitamin B-12  1,000 mcg Oral Daily    Continuous Inpatient Infusions:     PRN Inpatient Medications:  acetaminophen **OR** acetaminophen, benzonatate, guaiFENesin-dextromethorphan, hydrALAZINE, ondansetron **OR** ondansetron (ZOFRAN) IV, sodium chloride    Physical Examination: BP 167/53 mmHg  Pulse 58  Temp(Src) 97.6 F (36.4 C) (Oral)  Resp 20  Ht 5\' 1"  (1.549 m)  Wt 87.363 kg (192 lb 9.6 oz)   BMI 36.41 kg/m2  SpO2 90% Gen: NAD, alert and oriented x 4 eck: supple, no JVD or thyromegaly Chest: CTA bilaterally, no wheezes, crackles, or other adventitious sounds CV: RRR, no m/g/c/r Abd: soft, NT, ND, +BS in all four quadrants; no HSM, guarding, ridigity, or rebound tenderness, obese abd Skin: no rash or lesions noted Lymph: no LAD  Data: Lab Results  Component Value Date   WBC 12.5* 11/15/2014   HGB 9.1* 11/15/2014   HCT 27.6* 11/15/2014   MCV 84.3 11/15/2014   PLT 287 11/15/2014    Recent Labs Lab 11/13/14 0856 11/14/14 0500 11/15/14 0445  HGB 8.8* 9.3* 9.1*   Lab Results  Component Value Date   NA 138 11/15/2014   K 4.0 11/15/2014   CL 104 11/15/2014   CO2 29 11/15/2014   BUN 98* 11/15/2014   CREATININE 3.13* 11/15/2014   Lab Results  Component Value Date   ALT 10* 11/07/2014   AST 12* 11/07/2014   ALKPHOS 78 11/07/2014   BILITOT 0.9 11/07/2014   No results for input(s): APTT, INR, PTT in the last 168 hours.   Assessment/Plan: [name] is a 69 y.o. female  with CAP requring intubation, now extubated and to floor, also now with diarrhea, prev drop in Hgb.  Hgb now stable.  Stool output not quantified.   Recommendations: - quanitfy exact stool output  - start loperamide 2 gr q12 - remove rectal tube if output < 1L in next 12 hours.  - repeat c.diff if  stool output  > 1L in 24 hours - EGD and colon for anemia once resp status improved, likely as outpatient since Hgb now stable / trending up.   Please call with questions or concerns.  REIN, Grace Blight, MD

## 2014-11-15 NOTE — Progress Notes (Signed)
Pt's glucose is 156 this morning per/ sunquest results.

## 2014-11-15 NOTE — Progress Notes (Addendum)
Patient's daughter reported patient's rectal tube came out while on the bedside commode. Contacted Dr. Candace Cruise on call for GI and MD stated okay to leave rectal tube out since Dr. Rayann Heman had ordered to d/c at 6AM. Patient and daughter are both comfortable with leaving it out. Previously looked at tube and there was a small amount in the tube after being emptied that was more formed.

## 2014-11-16 ENCOUNTER — Inpatient Hospital Stay: Payer: Medicare Other

## 2014-11-16 LAB — BASIC METABOLIC PANEL
Anion gap: 11 (ref 5–15)
BUN: 90 mg/dL — AB (ref 6–20)
CHLORIDE: 102 mmol/L (ref 101–111)
CO2: 24 mmol/L (ref 22–32)
CREATININE: 3.11 mg/dL — AB (ref 0.44–1.00)
Calcium: 8.3 mg/dL — ABNORMAL LOW (ref 8.9–10.3)
GFR, EST AFRICAN AMERICAN: 16 mL/min — AB (ref >60)
GFR, EST NON AFRICAN AMERICAN: 14 mL/min — AB (ref >60)
Glucose, Bld: 192 mg/dL — ABNORMAL HIGH (ref 65–99)
Potassium: 3.5 mmol/L (ref 3.5–5.1)
SODIUM: 137 mmol/L (ref 135–145)

## 2014-11-16 LAB — GLUCOSE, CAPILLARY
GLUCOSE-CAPILLARY: 89 mg/dL (ref 65–99)
GLUCOSE-CAPILLARY: 190 mg/dL — AB (ref 65–99)
Glucose-Capillary: 200 mg/dL — ABNORMAL HIGH (ref 65–99)
Glucose-Capillary: 313 mg/dL — ABNORMAL HIGH (ref 65–99)

## 2014-11-16 MED ORDER — HEPARIN SODIUM (PORCINE) 5000 UNIT/ML IJ SOLN
5000.0000 [IU] | Freq: Three times a day (TID) | INTRAMUSCULAR | Status: DC
  Administered 2014-11-16 – 2014-11-17 (×3): 5000 [IU] via SUBCUTANEOUS
  Filled 2014-11-16 (×3): qty 1

## 2014-11-16 NOTE — Clinical Social Work Note (Signed)
Clinical Social Work Assessment  Patient Details  Name: Felicia Vincent MRN: [id number] Date of Birth: [birth year]  Date of referral:  11/15/14               Reason for consult:  Facility Placement                Permission sought to share information with:  Facility Sport and exercise psychologist, Family Supports Permission granted to share information::  Yes, Verbal Permission Granted, No  Name::     Daughter Felicia Vincent In the room  Permision granted  Agency::  SNF No; refusing SNF placement  Relationship::     Contact Information:     Housing/Transportation Living arrangements for the past 2 months:  Single Family Home Source of Information:  Patient, Adult Children Patient Interpreter Needed:  None Criminal Activity/Legal Involvement Pertinent to Current Situation/Hospitalization:  No - Comment as needed Significant Relationships:  Adult Children Lives with:  Adult Children (Her Son ) Do you feel safe going back to the place where you live?  Yes Need for family participation in patient care:  Yes (Comment)  Care giving concerns:  Current concerns are pt being able to get adequate help at home with her ADL.  Pt's daughter stated that she and her brother would be able to help.   Social Worker assessment / plan:  CSW spoke to pt and her daughter.  Pt and daughter stated that they are going to go home and are refusing SNF placement at this time.  Pt and daughter stated that she has help at home between pt's daughter and pt's son.  RNCM aware.  CSW signing off.  Employment status:  Disabled (Comment on whether or not currently receiving Disability), Retired Forensic scientist:  Medicare PT Recommendations:  [place] (PT anticipates [place]) Information / Referral to community resources:  [place]  Patient/Family's Response to care:  Pt and daughter thanked CSW for speaking with them about SNF, but they are refusing placement at this time.  Patient/Family's  Understanding of and Emotional Response to Diagnosis, Current Treatment, and Prognosis:  Pt and pt's daughter verbalized their understanding of DC.  And are planning on DC home with home health. CSW signing off.  Emotional Assessment Appearance:  Appears stated age Attitude/Demeanor/Rapport:   (calm) Affect (typically observed):  Appropriate Orientation:  Oriented to Self, Oriented to Place, Oriented to  Time Alcohol / Substance use:  Never Used Psych involvement (Current and /or in the community):  No (Comment)  Discharge Needs  Concerns to be addressed:  Care Coordination Readmission within the last 30 days:  No Current discharge risk:  None Barriers to Discharge:  No Barriers Identified   Mathews Argyle, LCSW 11/16/2014, 8:51 AM

## 2014-11-16 NOTE — Progress Notes (Signed)
Subjective:  Pt sitting up in chair. No new Cr resulted yet today.  Desat'd with exertion.  Objective:  Vital signs in last 24 hours:  Temp:  [97.6 F (36.4 C)-98.3 F (36.8 C)] 98 F (36.7 C) (09/22 1213) Pulse Rate:  [53-56] 53 (09/22 1213) Resp:  [19-21] 19 (09/22 1213) BP: (156-169)/(51-57) 156/51 mmHg (09/22 1213) SpO2:  [76 %-96 %] 91 % (09/22 1213) Weight:  [80.241 kg (176 lb 14.4 oz)-85.866 kg (189 lb 4.8 oz)] 80.241 kg (176 lb 14.4 oz) (09/22 0859)  Weight change:  Filed Weights   11/15/14 0436 11/16/14 0800 11/16/14 0859  Weight: 87.363 kg (192 lb 9.6 oz) 85.866 kg (189 lb 4.8 oz) 80.241 kg (176 lb 14.4 oz)    Intake/Output:    Intake/Output Summary (Last 24 hours) at 11/16/14 1346 Last data filed at 11/16/14 0914  Gross per 24 hour  Intake     30 ml  Output   1300 ml  Net  -1270 ml     Physical Exam: General: frail, elderly, no acute distress  HEENT Extubated, eyes open, OM moist  Neck supple  Pulm/lungs Scattered rhonchi, normal effort  CVS/Heart irregular rhythm, no rub or gallop  Abdomen:  soft, nontender, BS present  Extremities: Trace lower extremity edema  Neurologic: Awake, alert, following commands readily  Skin: No acute rashes    Foley present        Basic Metabolic Panel:   Recent Labs Lab 11/10/14 1302  11/11/14 0439 11/12/14 0856 11/13/14 0856 11/14/14 0500 11/15/14 0445  NA 138  < > 137 139 140 142 138  K 4.2  < > 3.6 3.4* 3.2* 3.1* 4.0  CL 106  < > 105 105 103 103 104  CO2 25  < > 21* 23 23 27 29   GLUCOSE 205*  < > 349* 148* 301* 62* 156*  BUN 41*  < > 61* 82* 98* 74* 98*  CREATININE 2.75*  < > 3.21* 3.62* 3.66* 3.29* 3.13*  CALCIUM 8.3*  < > 8.1* 8.8* 8.4* 8.7* 8.0*  MG 1.6*  --   --   --   --   --   --   PHOS  --   --  3.8  --   --   --   --   < > = values in this interval not displayed.   CBC:  Recent Labs Lab 11/10/14 0511 11/11/14 0543 11/11/14 0854 11/12/14 0614 11/13/14 0856 11/14/14 0500  11/15/14 0445  WBC 27.5* 17.7*  --   --  19.1* 19.8* 12.5*  HGB 8.5* 6.4* 6.5* 7.5* 8.8* 9.3* 9.1*  HCT 23.8* 19.3*  --   --  26.6* 27.8* 27.6*  MCV 98.3 86.9  --   --  84.8 84.7 84.3  PLT 372 335  --   --  356 372 287      Microbiology:  Recent Results (from the past 720 hour(s))  Culture, blood (routine x 2)     Status: None   Collection Time: 11/07/14 12:30 PM  Result Value Ref Range Status   Specimen Description BLOOD RIGHT ASSIST CONTROL  Final   Special Requests   Final    BOTTLES DRAWN AEROBIC AND ANAEROBIC  2CC AERO 1 CC ANAERO   Culture NO GROWTH 5 DAYS  Final   Report Status 11/12/2014 FINAL  Final  Culture, blood (routine x 2)     Status: None   Collection Time: 11/07/14 12:48 PM  Result Value Ref Range Status  Specimen Description BLOOD LEFT FATTY CASTS  Final   Special Requests BOTTLES DRAWN AEROBIC AND ANAEROBIC  1CC  Final   Culture NO GROWTH 5 DAYS  Final   Report Status 11/12/2014 FINAL  Final  MRSA PCR Screening     Status: None   Collection Time: 11/07/14  6:06 PM  Result Value Ref Range Status   MRSA by PCR NEGATIVE NEGATIVE Final    Comment:        The GeneXpert MRSA Assay (FDA approved for NASAL specimens only), is one component of a comprehensive MRSA colonization surveillance program. It is not intended to diagnose MRSA infection nor to guide or monitor treatment for MRSA infections.   C difficile quick scan w PCR reflex     Status: None   Collection Time: 11/08/14  4:15 AM  Result Value Ref Range Status   C Diff antigen NEGATIVE NEGATIVE Final   C Diff toxin NEGATIVE NEGATIVE Final   C Diff interpretation Negative for C. difficile  Final    Coagulation Studies: No results for input(s): LABPROT, INR in the last 72 hours.  Urinalysis: No results for input(s): COLORURINE, LABSPEC, PHURINE, GLUCOSEU, HGBUR, BILIRUBINUR, KETONESUR, PROTEINUR, UROBILINOGEN, NITRITE, LEUKOCYTESUR in the last 72 hours.  Invalid input(s): APPERANCEUR     Imaging: Dg Chest 1 View  11/16/2014   CLINICAL DATA:  Follow-up pneumonia  EXAM: CHEST 1 VIEW  COMPARISON:  11/14/2014  FINDINGS: Cardiomediastinal silhouette is stable. Right IJ catheter with tip in right atrium. No pulmonary edema. Again noted patchy bilateral airspace disease without change in aeration  IMPRESSION: No pulmonary edema. Right IJ catheter with tip in right atrium. Again noted patchy bilateral airspace disease without change in aeration.   Electronically Signed   By: Lahoma Crocker M.D.   On: 11/16/2014 10:49     Medications:     . sodium chloride   Intravenous Once  . amLODipine  5 mg Oral QHS  . aspirin EC  81 mg Oral Daily  . atorvastatin  10 mg Oral QHS  . budesonide (PULMICORT) nebulizer solution  0.25 mg Nebulization BID  . cefUROXime  500 mg Oral BID WC  . chlorhexidine gluconate  15 mL Mouth Rinse BID  . citalopram  20 mg Oral QPM  . clopidogrel  75 mg Oral QHS  . donepezil  5 mg Oral Daily  . furosemide  20 mg Oral Daily  . Gerhardt's butt cream   Topical QID  . guaiFENesin  600 mg Oral BID  . heparin subcutaneous  5,000 Units Subcutaneous 3 times per day  . hydrALAZINE  50 mg Oral TID  . insulin aspart  0-15 Units Subcutaneous TID WC  . insulin aspart  0-5 Units Subcutaneous QHS  . insulin detemir  20 Units Subcutaneous BID  . ipratropium-albuterol  3 mL Nebulization Q4H  . isosorbide mononitrate  90 mg Oral Daily  . loperamide  2 mg Oral Q12H  . pantoprazole  40 mg Oral Daily  . pneumococcal 23 valent vaccine  0.5 mL Intramuscular Tomorrow-1000  . psyllium  1 packet Oral Daily  . saccharomyces boulardii  250 mg Oral BID  . sodium chloride  10-40 mL Intracatheter Q12H  . vitamin B-12  1,000 mcg Oral Daily   acetaminophen **OR** acetaminophen, benzonatate, guaiFENesin-dextromethorphan, hydrALAZINE, ondansetron **OR** ondansetron (ZOFRAN) IV, sodium chloride  Assessment/ Plan:  69 y.o. female with diabetic kidney disease, hypertension, diabetic  retinopathy, bilateral cataracts, history of TIA and chronic kidney disease stage V is admitted for  pneumonia  1. Acute renal failure on chronic kidney disease stage V/Diabetes mellitus type 2 with CKD V. - Likely ATN due to concurrent illness. Recent worsening due to hypotension prior to intubation -No new Cr today, awaiting this result, good UOP noted, no acute indication for HD noted.  2. Acute respiratory failure secondary to bilateral pneumonia and Pulm edema -  Desat'd into the 70s with exertion today, will need continued pulmonary support with O2, budesonide, ipratropium/albuterol.    3. Hypertension  -BP 156/51 today, continue hydralazine and amlodipine.  4. Hypokalemia:  Awaiting new K today.       LOS: [place] 9/22/20161:46 PM

## 2014-11-16 NOTE — Progress Notes (Signed)
Per family, pt does not take sotalol anymore. PTA med rec updated and MD aware. Pt daughter also requesting ABG, MD notified and aware and is to order one. Daughter updated that patient will be having a chest xray today. Daughter requesting AM breathing treatments be given; pt refused this AM because she was eating breakfast. Breathing tx administered per family request. Pt reports no shortness of breath or discomfort.

## 2014-11-16 NOTE — Progress Notes (Signed)
Physical Therapy Treatment Patient Details Name: Felicia Vincent MRN: [id number] DOB: 10/20/1945 Today's Date: 11/16/2014    History of Present Illness Pt is a 69 y.o. female presenting to hospital with SOB and placed on BiPAP.  Pt admitted to hospital with B PNA with acute and progressive hypoxic respiratory failure from PNA with progressive renal failure.  Pt s/p 1 unit PRBC's d/t decrease in Hgb.  Pt transitioned to high flow nasal cannula but was emergently intubated 9/16 and extubated 11/13/14.  PMH includes HTN, DM type 2, chronic kidney disease, h/o previous CVA, TIA.    PT Comments    Pt stood fairly well with RW and took a few steps with RW forward onto scale for weighing with nursing assist, then stepped off scale with assist and took a few steps to recliner chair.  Pt's O2 noted to be 91-92% on 2 L/min via nasal cannula at rest and after pt sat in recliner chair pt's O2 noted to be 76%.  It took about 10 minutes and an increase of supplemental O2 from 2 to 2 1/2 L/min via nasal cannula to increase back to 92% (nursing present entire session monitoring O2 and nursing titrated O2).  Overall pt was min assist with transfers and gait with RW.  Pt does appear deconditioned but anticipate pt could walk further but deferred today d/t pt's respiratory status impairments (significant drop in O2 with above mobility and taking significant amount of time to improve O2 sats).  Recommend pulmonary rehab/STR d/t respiratory concerns with activity but per CM pt's family wants to take pt home and will provide 24/7 assist.   Follow Up Recommendations  SNF (Pulmonary rehab)     Equipment Recommendations  Rolling walker with 5" wheels    Recommendations for Other Services       Precautions / Restrictions Precautions Precautions: Fall Precaution Comments: monitor O2 closely Restrictions Weight Bearing Restrictions: No    Mobility  Bed Mobility Overal bed mobility: Needs Assistance Bed Mobility:  Supine to Sit     Supine to sit: Supervision;HOB elevated        Transfers Overall transfer level: Needs assistance Equipment used: Rolling walker (2 wheeled) Transfers: Sit to/from Stand Sit to Stand: Min assist         General transfer comment: vc's required for technique with transfer  Ambulation/Gait Ambulation/Gait assistance: Min assist Ambulation Distance (Feet): 4 Feet (pt ambulated a few steps forward and stepped onto a scale for weighing with nursing assist; pt then stepped off the scale with assist and took a few steps to the recliner chair) Assistive device: Rolling walker (2 wheeled)   Gait velocity: decreased   General Gait Details: decreased B step length/foot clearance/heelstrike; vc's required to take longer steps and for use of RW   Stairs            Wheelchair Mobility    Modified Rankin (Stroke Patients Only)       Balance Overall balance assessment: Needs assistance Sitting-balance support: Feet supported;Bilateral upper extremity supported Sitting balance-Leahy Scale: Good     Standing balance support: Bilateral upper extremity supported (on RW) Standing balance-Leahy Scale: Fair Standing balance comment: stood on scale with CGA                    Cognition Arousal/Alertness: Awake/alert Behavior During Therapy: WFL for tasks assessed/performed Overall Cognitive Status: Within Functional Limits for tasks assessed  Exercises      General Comments   Nursing cleared pt for participation in physical therapy and present entire session.  Pt agreeable to PT session.       Pertinent Vitals/Pain Pain Assessment: No/denies pain  See flow sheet for details of HR and O2 (plus above for O2 vitals).    Home Living                      Prior Function            PT Goals (current goals can now be found in the care plan section) Acute Rehab PT Goals Patient Stated Goal: To be able to walk  again PT Goal Formulation: With patient Time For Goal Achievement: 11/28/14 Potential to Achieve Goals: Good Progress towards PT goals: Progressing toward goals    Frequency  Min 2X/week    PT Plan Current plan remains appropriate    Co-evaluation             End of Session Equipment Utilized During Treatment: Gait belt;Oxygen Activity Tolerance: Other (comment) (Limited d/t O2 desaturation with activity) Patient left: in chair;with call bell/phone within reach;with chair alarm set;with nursing/sitter in room     Time: 4174-0814 PT Time Calculation (min) (ACUTE ONLY): 32 min  Charges:  $Therapeutic Activity: 23-37 mins                    G CodesLeitha Bleak 11/27/14, 9:30 AM Leitha Bleak, [place]

## 2014-11-16 NOTE — Patient Outreach (Signed)
[facility]) Care Management  6/71/2458  Otilia Kareem Spiker [birth year] [id number]   Referral from [name], RN to assign Community RN and PharmD, assigned Kathie Rhodes, RN and Deanne Coffer, PharmD.  Thanks, Ronnell Freshwater. [place], [place] Assistant Phone: [phone] Fax: [phone]

## 2014-11-16 NOTE — Progress Notes (Signed)
[facility] Progess Note    ASSESSMENT/PLAN    68 yo white female with acute and progressive hypoxic resp failure From pneumonia with acute renal failure now Stage V kidney disease   PULMONARY A: -Respiratory Failure due to pneumonia/edema - O2 sat is now 91% on 1 L nasal cannula. -continue Bronchodilator Therapy(pulmicort and dounebs) -wean down steroids-on oral predinisone -Currently on antibiotics.  -Advance activity as tolerated, -remove central line -recommend PT   ADMISSION DATE: 11/07/2014  INITIAL PRESENTATION:  32 F initially seen as outpt one week PTA with complaints of weakness, dyspnea, cough and treated for PNA as outpt with levofloxacin. Was intolerant of this medication due to nausea and was changed to amoxicillin 4 days PTA. Seen in ED 9/13 with worsening dyspnea and admitted with dx of acute hypoxic resp failure due to CAP   intubated for worsening resp failure, extubated 9/19       CHIEF COMPLAINT:  Follow Dyspnea   SUBJECTIVE:  Patient feeling and looking better today. No new complaints. Still some SOB, still requiring [place] oxygen therapy On oral meds Will need to d/c central line  Review of Systems:  Constitutional: Feels well. Cardiovascular: No chest pain.  Pulmonary: Denies dyspnea.   The remainder of systems were reviewed and were found to be negative other than what is documented in the HPI.    VITAL SIGNS: Temp:  [97.6 F (36.4 C)-98.3 F (36.8 C)] 98 F (36.7 C) (09/22 1213) Pulse Rate:  [53-56] 53 (09/22 1213) Resp:  [19-21] 19 (09/22 1213) BP: (156-169)/(51-57) 156/51 mmHg (09/22 1213) SpO2:  [76 %-96 %] 91 % (09/22 1213) Weight:  [176 lb 14.4 oz (80.241 kg)-189 lb 4.8 oz (85.866 kg)] 176 lb 14.4 oz (80.241 kg) (09/22 0859) HEMODYNAMICS:   INTAKE / OUTPUT:  Intake/Output Summary (Last 24 hours) at 11/16/14 1434 Last data filed at 11/16/14 0914  Gross per 24 hour  Intake     30 ml  Output   1300 ml    Net  -1270 ml    PHYSICAL EXAMINATION: Physical Examination:   VS: BP 156/51 mmHg  Pulse 53  Temp(Src) 98 F (36.7 C) (Oral)  Resp 19  Ht 5\' 1"  (1.549 m)  Wt 176 lb 14.4 oz (80.241 kg)  BMI 33.44 kg/m2  SpO2 91%  General Appearance: No distress  Neuro:without focal findings, mental status normal. HEENT: PERRLA, EOM intact. Pulmonary: normal breath sounds +crackles at bases   CardiovascularNormal S1,S2.  No m/r/g.   Abdomen: Benign, Soft, non-tender. Renal:  No costovertebral tenderness  Skin:   warm, no rashes, no ecchymosis  Extremities: normal, no cyanosis, clubbing.    LABORATORY PANEL:   CBC  Recent Labs Lab 11/15/14 0445  WBC 12.5*  HGB 9.1*  HCT 27.6*  PLT 287    Chemistries   Recent Labs Lab 11/10/14 1302  11/11/14 0439  11/16/14 1018  NA 138  < > 137  < > 137  K 4.2  < > 3.6  < > 3.5  CL 106  < > 105  < > 102  CO2 25  < > 21*  < > 24  GLUCOSE 205*  < > 349*  < > 192*  BUN 41*  < > 61*  < > 90*  CREATININE 2.75*  < > 3.21*  < > 3.11*  CALCIUM 8.3*  < > 8.1*  < > 8.3*  MG 1.6*  --   --   --   --  PHOS  --   --  3.8  --   --   < > = values in this interval not displayed.   Recent Labs Lab 11/15/14 1210 11/15/14 1622 11/15/14 1956 11/15/14 2247 11/16/14 0756 11/16/14 1211  GLUCAP 199* 351* 515* 389* 89 190*    Recent Labs Lab 11/11/14 0830 11/13/14 0905 11/14/14 0434  PHART 7.36 7.37 7.49*  PCO2ART 36 43 39  PO2ART 50* 82* 63*    Recent Labs Lab 11/11/14 0439  ALBUMIN 1.7*    Cardiac Enzymes No results for input(s): TROPONINI in the last 168 hours.  RADIOLOGY:  Dg Chest 1 View  11/16/2014   CLINICAL DATA:  Follow-up pneumonia  EXAM: CHEST 1 VIEW  COMPARISON:  11/14/2014  FINDINGS: Cardiomediastinal silhouette is stable. Right IJ catheter with tip in right atrium. No pulmonary edema. Again noted patchy bilateral airspace disease without change in aeration  IMPRESSION: No pulmonary edema. Right IJ catheter with tip in  right atrium. Again noted patchy bilateral airspace disease without change in aeration.   Electronically Signed   By: Lahoma Crocker M.D.   On: 11/16/2014 10:49   I have personally obtained a history, examined the patient, evaluated Pertinent laboratory and RadioGraphic/imaging results, and  formulated the assessment and plan   The Patient requires high complexity decision making for assessment and support, frequent evaluation and titration of therapies. Time Spent with patient 30  mins   Kurian Patricia Pesa, M.D.  [facility]  Medical Director [place] Director [facility] Cardio-Pulmonary Department

## 2014-11-16 NOTE — Progress Notes (Signed)
Inpatient Diabetes Program Recommendations  AACE/ADA: New Consensus Statement on Inpatient Glycemic Control (2015)  Target Ranges:  Prepandial:   less than 140 mg/dL      Peak postprandial:   less than 180 mg/dL (1-2 hours)      Critically ill patients:  140 - 180 mg/dL   Results for Felicia Vincent, Felicia Vincent (MRN [id number]) as of 11/16/2014 09:20  Ref. Range 11/15/2014 12:10 11/15/2014 16:22 11/15/2014 19:56 11/15/2014 22:47 11/16/2014 07:56  Glucose-Capillary Latest Ref Range: 65-99 mg/dL 199 (H) 351 (H) 515 (H) 389 (H) 89    Review of Glycemic Control  Home DM Meds: Levemir 30-35 units QHS  Amaryl 2 mg bid  Victoza 1.8 mg daily  Current DM Orders: Lantus 20 units bid  Novolog 0-15 units tid, Novolog 0-5 units tid    Elevated CBG because of steroids.  If the patient is going to continue on steroids, might want to consider giving Novolog 5 units tid with meals to manage post prandial exertions.    Continue Novolog correction (SSI) as ordered     [name], RN, BA, MHA, CDE Diabetes Coordinator Inpatient Diabetes Program  [phone] (Team Pager) [phone] ([place]) 11/16/2014 9:32 AM

## 2014-11-16 NOTE — Progress Notes (Addendum)
RN attempted PIV once with no success, second nurse at bedside to assess. Patient has 2+ pitting edema and unable to obtain IV access. Family declining removal of central line at this time and requesting it be kept until discharge. Central line still in place, Dr. Vianne Bulls paged and MD made aware of situation, RN received verbal order to keep central line in place. Family aware of discharge plan, to keep patient over weekend. Family expresses frustration that must wait for PT to work more with patient before she can be discharged. Pt expresses interest in seeing PT do more with the patient so she can get better. RN explained that PT hesitant to do too much work with patient as patient desats into 19s with minimal exertion. Patient has sat up in chair majority of the day and done well with transfers to [facility]. Family has been present entire day and witness transfers.

## 2014-11-16 NOTE — Progress Notes (Signed)
[facility] [place] NAME: Felicia Vincent    MR#:  [id number]  DATE OF BIRTH:  06-11-1945  SUBJECTIVE:  Bedside. Patient oxygen saturation dropped to 78% on 2 L when she stood up with physical therapy. Sats went up to about 90% on 2 1/2 L of oxygen. Does have some cough. No shortness of breath. Patient lives at home at her son.    REVIEW OF SYSTEMS:   Review of Systems  Constitutional: Negative for fever, chills and weight loss.  HENT: Negative for ear discharge, ear pain, hearing loss and nosebleeds.   Eyes: Negative for blurred vision, double vision, photophobia, pain and discharge.  Respiratory: Positive for cough and sputum production. Negative for hemoptysis, shortness of breath, wheezing and stridor.   Cardiovascular: Negative for chest pain, palpitations, orthopnea, leg swelling and PND.  Gastrointestinal: Negative for nausea, vomiting, abdominal pain and diarrhea.  Genitourinary: Negative for dysuria, urgency and frequency.  Musculoskeletal: Negative for myalgias, back pain, joint pain and neck pain.  Skin: Negative for rash.  Neurological: Positive for weakness. Negative for dizziness, sensory change, speech change, focal weakness, seizures and headaches.  Psychiatric/Behavioral: Negative for depression and memory loss. The patient is not nervous/anxious and does not have insomnia.   All other systems reviewed and are negative.  DRUG ALLERGIES:   Allergies  Allergen Reactions  . Codeine Nausea Only  . Prednisone Other (See Comments)    Pts daughter states that this medication "makes her evil".      VITALS:  Blood pressure 162/57, pulse 53, temperature 98.3 F (36.8 C), temperature source Oral, resp. rate 21, height 5\' 1"  (1.549 m), weight 80.241 kg (176 lb 14.4 oz), SpO2 92 %.  PHYSICAL EXAMINATION:   Physical Exam  GENERAL:  69 y.o.-year-old patient lying in the bed with no acute distress. critically ill EYES: Pupils  equal, round, reactive to light and accommodation. No scleral icterus. Extraocular muscles intact.  HEENT: Head atraumatic, normocephalic. Oropharynx and nasopharynx clear. Intubated and on the vent NECK:  Supple, no jugular venous distention. No thyroid enlargement, no tenderness.  LUNGS: decreased breath sounds bilaterally, no wheezing, rales, no rhonchi. No use of accessory muscles of respiration.  CARDIOVASCULAR: S1, S2 normal. No murmurs, rubs, or gallops.  ABDOMEN: Soft, nontender, nondistended. Bowel sounds present. No organomegaly or mass.  EXTREMITIES: No cyanosis, clubbing or edema b/l.    NEUROLOGIC: overall grossly non focal. Weak PSYCHIATRIC: alert and Ox3 SKIN: No obvious rash, lesion, or ulcer.   LABORATORY PANEL:   CBC  Recent Labs Lab 11/15/14 0445  WBC 12.5*  HGB 9.1*  HCT 27.6*  PLT 287    Chemistries   Recent Labs Lab 11/10/14 1302  11/15/14 0445  NA 138  < > 138  K 4.2  < > 4.0  CL 106  < > 104  CO2 25  < > 29  GLUCOSE 205*  < > 156*  BUN 41*  < > 98*  CREATININE 2.75*  < > 3.13*  CALCIUM 8.3*  < > 8.0*  MG 1.6*  --   --   < > = values in this interval not displayed.  Cardiac Enzymes No results for input(s): TROPONINI in the last 168 hours.  RADIOLOGY:  No results found. ASSESSMENT AND PLAN:   69 year old with bilateral pneumonia  1.  acute hypoxic respiratory failure secondary to bilateral pneumonia: and now with possible CHF acute  diastolic :continue with  Ceftriaxone, azithromycin, continue oxygen. Patient  may need further doses of IV antibiotics, repeat chest x-ray today.  Acute on chronic diastolic heart failure: Continue by mouth Lasix, oxygen. EF is more than 55% by recent echo.  2. Acute renal failure on chronic renal failure due to dehydration: Baseline seems to be at 3.0 Now off IV lasix gtt  avoid nephrotoxins Received Kayexalate for hyperkalemia and nephrology is following. Has underlying chronic kidney disease stage V.  3.  Hypertension continue imdur and hydralazine  -resumed other home meds Bradycardia ; hold sotalol today and decrease  Dose from tomorrow. 4. Diabetes type 2 with hyperglycemia  -was on insulin gtt-->levemir and ssi Jessica Levemir for her.  5. History of CVA continue aspirin, Lasix.  6. Diarrhea. ; Discontinue rectal tube.  7.   DVT prophylaxis heparin  8. Anemia of chornic illness with heme positive stools -hgb trending up after 1 unit BT -GI w/u at later date  Case discussed with Care Management/Social Worker.  CODE STATUS: full  DVT Prophylaxis: heparin  TOTAL TIME TAKING CARE OF THIS PATIENT: 40minutes.  >50% time spent on counselling and coordination of care Dr Juanell Fairly, pt's dter   Epifanio Lesches M.D on 11/16/2014 at 9:22 AM  Between 7am to 6pm - Pager - [phone]  After 6pm go to [email or web address] - password [email or web address] [facility]  Office  [phone]  CC: Primary care physician; Lavera Guise, MD

## 2014-11-17 DIAGNOSIS — I5031 Acute diastolic (congestive) heart failure: Secondary | ICD-10-CM

## 2014-11-17 LAB — GLUCOSE, CAPILLARY
GLUCOSE-CAPILLARY: 295 mg/dL — AB (ref 65–99)
Glucose-Capillary: 87 mg/dL (ref 65–99)

## 2014-11-17 MED ORDER — GUAIFENESIN ER 600 MG PO TB12: 600.0000 mg | ORAL_TABLET | Freq: Two times a day (BID) | ORAL | Status: AC

## 2014-11-17 MED ORDER — GUAIFENESIN-DM 100-10 MG/5ML PO SYRP: 10.0000 mL | ORAL_SOLUTION | Freq: Four times a day (QID) | ORAL | Status: AC | PRN

## 2014-11-17 MED ORDER — CEFUROXIME AXETIL 500 MG PO TABS: 500.0000 mg | ORAL_TABLET | Freq: Two times a day (BID) | ORAL | Status: AC

## 2014-11-17 MED ORDER — BUDESONIDE 0.25 MG/2ML IN SUSP: 0.2500 mg | Freq: Two times a day (BID) | RESPIRATORY_TRACT | Status: AC

## 2014-11-17 MED ORDER — LOPERAMIDE HCL 2 MG PO CAPS: 2.0000 mg | ORAL_CAPSULE | Freq: Three times a day (TID) | ORAL | Status: DC | PRN

## 2014-11-17 MED ORDER — SACCHAROMYCES BOULARDII 250 MG PO CAPS: 250.0000 mg | ORAL_CAPSULE | Freq: Two times a day (BID) | ORAL | Status: AC

## 2014-11-17 MED ORDER — IPRATROPIUM-ALBUTEROL 0.5-2.5 (3) MG/3ML IN SOLN: 3.0000 mL | Freq: Four times a day (QID) | RESPIRATORY_TRACT | Status: AC | PRN

## 2014-11-17 NOTE — Progress Notes (Signed)
Subjective:  Pt seen at bedside, UOP 1.2 liters.  Cr 3.11. Still desating with movement.  Daughter at bedside.   Objective:  Vital signs in last 24 hours:  Temp:  [97.7 F (36.5 C)-98.1 F (36.7 C)] 97.7 F (36.5 C) (09/23 1130) Pulse Rate:  [53-61] 55 (09/23 1130) Resp:  [18-20] 20 (09/23 1130) BP: (149-161)/(41-53) 149/41 mmHg (09/23 1130) SpO2:  [83 %-98 %] 97 % (09/23 1130) Weight:  [85.503 kg (188 lb 8 oz)-85.866 kg (189 lb 4.8 oz)] 85.866 kg (189 lb 4.8 oz) (09/23 0819)  Weight change:  Filed Weights   11/16/14 0859 11/17/14 0540 11/17/14 0819  Weight: 80.241 kg (176 lb 14.4 oz) 85.503 kg (188 lb 8 oz) 85.866 kg (189 lb 4.8 oz)    Intake/Output:    Intake/Output Summary (Last 24 hours) at 11/17/14 1132 Last data filed at 11/17/14 1037  Gross per 24 hour  Intake    240 ml  Output   1180 ml  Net   -940 ml     Physical Exam: General: frail, elderly, no acute distress  HEENT [place]/AT EOMI hearing intact OM moist  Neck supple  Pulm/lungs Scattered rhonchi, normal effort  CVS/Heart irregular rhythm, no rubs or gallop  Abdomen:  soft, nontender, BS present  Extremities: Trace lower extremity edema  Neurologic: Awake, alert, sitting up, follows commands  Skin: No acute rashes           Basic Metabolic Panel:   Recent Labs Lab 11/10/14 1302  11/11/14 0439 11/12/14 0856 11/13/14 0856 11/14/14 0500 11/15/14 0445 11/16/14 1018  NA 138  < > 137 139 140 142 138 137  K 4.2  < > 3.6 3.4* 3.2* 3.1* 4.0 3.5  CL 106  < > 105 105 103 103 104 102  CO2 25  < > 21* 23 23 27 29 24   GLUCOSE 205*  < > 349* 148* 301* 62* 156* 192*  BUN 41*  < > 61* 82* 98* 74* 98* 90*  CREATININE 2.75*  < > 3.21* 3.62* 3.66* 3.29* 3.13* 3.11*  CALCIUM 8.3*  < > 8.1* 8.8* 8.4* 8.7* 8.0* 8.3*  MG 1.6*  --   --   --   --   --   --   --   PHOS  --   --  3.8  --   --   --   --   --   < > = values in this interval not displayed.   CBC:  Recent Labs Lab 11/11/14 0543 11/11/14 0854  11/12/14 0614 11/13/14 0856 11/14/14 0500 11/15/14 0445  WBC 17.7*  --   --  19.1* 19.8* 12.5*  HGB 6.4* 6.5* 7.5* 8.8* 9.3* 9.1*  HCT 19.3*  --   --  26.6* 27.8* 27.6*  MCV 86.9  --   --  84.8 84.7 84.3  PLT 335  --   --  356 372 287      Microbiology:  Recent Results (from the past 720 hour(s))  Culture, blood (routine x 2)     Status: None   Collection Time: 11/07/14 12:30 PM  Result Value Ref Range Status   Specimen Description BLOOD RIGHT ASSIST CONTROL  Final   Special Requests   Final    BOTTLES DRAWN AEROBIC AND ANAEROBIC  2CC AERO 1 CC ANAERO   Culture NO GROWTH 5 DAYS  Final   Report Status 11/12/2014 FINAL  Final  Culture, blood (routine x 2)     Status: None  Collection Time: 11/07/14 12:48 PM  Result Value Ref Range Status   Specimen Description BLOOD LEFT FATTY CASTS  Final   Special Requests BOTTLES DRAWN AEROBIC AND ANAEROBIC  1CC  Final   Culture NO GROWTH 5 DAYS  Final   Report Status 11/12/2014 FINAL  Final  MRSA PCR Screening     Status: None   Collection Time: 11/07/14  6:06 PM  Result Value Ref Range Status   MRSA by PCR NEGATIVE NEGATIVE Final    Comment:        The GeneXpert MRSA Assay (FDA approved for NASAL specimens only), is one component of a comprehensive MRSA colonization surveillance program. It is not intended to diagnose MRSA infection nor to guide or monitor treatment for MRSA infections.   C difficile quick scan w PCR reflex     Status: None   Collection Time: 11/08/14  4:15 AM  Result Value Ref Range Status   C Diff antigen NEGATIVE NEGATIVE Final   C Diff toxin NEGATIVE NEGATIVE Final   C Diff interpretation Negative for C. difficile  Final    Coagulation Studies: No results for input(s): LABPROT, INR in the last 72 hours.  Urinalysis: No results for input(s): COLORURINE, LABSPEC, PHURINE, GLUCOSEU, HGBUR, BILIRUBINUR, KETONESUR, PROTEINUR, UROBILINOGEN, NITRITE, LEUKOCYTESUR in the last 72 hours.  Invalid input(s):  APPERANCEUR    Imaging: Dg Chest 1 View  11/16/2014   CLINICAL DATA:  Follow-up pneumonia  EXAM: CHEST 1 VIEW  COMPARISON:  11/14/2014  FINDINGS: Cardiomediastinal silhouette is stable. Right IJ catheter with tip in right atrium. No pulmonary edema. Again noted patchy bilateral airspace disease without change in aeration  IMPRESSION: No pulmonary edema. Right IJ catheter with tip in right atrium. Again noted patchy bilateral airspace disease without change in aeration.   Electronically Signed   By: Lahoma Crocker M.D.   On: 11/16/2014 10:49     Medications:     . sodium chloride   Intravenous Once  . amLODipine  5 mg Oral QHS  . aspirin EC  81 mg Oral Daily  . atorvastatin  10 mg Oral QHS  . budesonide (PULMICORT) nebulizer solution  0.25 mg Nebulization BID  . cefUROXime  500 mg Oral BID WC  . chlorhexidine gluconate  15 mL Mouth Rinse BID  . citalopram  20 mg Oral QPM  . clopidogrel  75 mg Oral QHS  . donepezil  5 mg Oral Daily  . furosemide  20 mg Oral Daily  . Gerhardt's butt cream   Topical QID  . guaiFENesin  600 mg Oral BID  . heparin subcutaneous  5,000 Units Subcutaneous 3 times per day  . hydrALAZINE  50 mg Oral TID  . insulin aspart  0-15 Units Subcutaneous TID WC  . insulin aspart  0-5 Units Subcutaneous QHS  . insulin detemir  20 Units Subcutaneous BID  . ipratropium-albuterol  3 mL Nebulization Q4H  . isosorbide mononitrate  90 mg Oral Daily  . loperamide  2 mg Oral Q12H  . pantoprazole  40 mg Oral Daily  . pneumococcal 23 valent vaccine  0.5 mL Intramuscular Tomorrow-1000  . psyllium  1 packet Oral Daily  . saccharomyces boulardii  250 mg Oral BID  . sodium chloride  10-40 mL Intracatheter Q12H  . vitamin B-12  1,000 mcg Oral Daily   acetaminophen **OR** acetaminophen, benzonatate, guaiFENesin-dextromethorphan, hydrALAZINE, ondansetron **OR** ondansetron (ZOFRAN) IV, sodium chloride  Assessment/ Plan:  69 y.o. female with diabetic kidney disease, hypertension,  diabetic retinopathy, bilateral  cataracts, history of TIA and chronic kidney disease stage V is admitted for pneumonia  1. Acute renal failure on chronic kidney disease stage V/Diabetes mellitus type 2 with CKD V. - Likely ATN due to concurrent illness. Recent worsening due to hypotension prior to intubation -Renal function appears to have stabilized, Cr 3.11, good UOP noted.  No acute indication to initiate dialysis at this time but will need close outpt follow up.  2. Acute respiratory failure secondary to bilateral pneumonia and Pulm edema -  Continues to have O2 desaturation with movement.  On O2 and will likely need to go home on it.      3. Hypertension  -BP 149/41, continue hydralazine and amlodipine, has had low diastolic BP.  4. Hypokalemia:  K was 3.5 yesterday, will need continued outpt monitoring since shes back on lasix.      LOS: Metz, MUNSOOR 9/23/201611:32 AM

## 2014-11-17 NOTE — Discharge Summary (Signed)
[facility] [place] NAME: Felicia Vincent    MR#:  [id number]  DATE OF BIRTH:  December 30, 1945  DATE OF ADMISSION:  11/07/2014 ADMITTING PHYSICIAN: Dustin Flock, MD  DATE OF DISCHARGE: 11/17/2014  PRIMARY CARE PHYSICIAN: Lavera Guise, MD    ADMISSION DIAGNOSIS:  Community acquired pneumonia [J18.9]  DISCHARGE DIAGNOSIS:  Active Problems:   Pneumonia   Acute respiratory failure   Acute diastolic congestive heart failure   SECONDARY DIAGNOSIS:   Past Medical History  Diagnosis Date  . Hypertension   . Diabetes mellitus without complication   . TIA (transient ischemic attack)   . Cataract   . Stroke   . Dizziness   . Anemia   . Pneumonia     HOSPITAL COURSE:   69 year old female with past medical history significant for hypertension, diabetes, anemia of chronic disease, CK D stage IV presents to the hospital secondary to acute respiratory failure from bilateral pneumonia.  #1 Acute respiratory failure-secondary to bilateral pneumonia and also acute diastolic CHF exacerbation. -Patient was intubated, in ICU, currently extubated. She is saturating descent on 2 L oxygen and on exertion is needing 3 L O2. -Repeat chest x-ray showing bibasilar pneumonia. -Patient feels much stronger and is very eager to go home. Patient's daughter is a Marine scientist and they have expressed confidence in taking care of her at home. She will be discharged with home health. -Continue her cefuroxime to finish off the course. She is off with the steroids now. -Discharge with a nebulizer machine and continue duo nebs and Pulmicort nebs - incentive spirometer and flutter valve, check home o2 sats  2. Acute on chronic diastolic heart failure-was on IV Lasix drip, no transition to oral Lasix. -No pulmonary edema on chest x-ray. -Echo with normal ejection fraction. Does have severe tricuspid regurgitation and also moderate pulmonary hypertension. -Appreciate  pulmonary consult in the hospital.  3. Acute on chronic renal failure-CK D stage IV- V at baseline with creatinine of 3 -Nephrotoxins were held. Was seen by nephrology in the hospital. -Creatinine is stable at baseline.  4. Hypertension-continue home medications.  5. Diabetes mellitus-continue home Levemir and victoza  6. History of CVA-continue aspirin and statin  7. Anemia of chronic illness-stable currently. Patient did receive 1 unit of packed RBC transfusion during this admission. -No active bleeding noted.  Discharge today with home health.  DISCHARGE CONDITIONS:   Stable  CONSULTS OBTAINED:  Treatment Team:  Murlean Iba, MD Wilhelmina Mcardle, MD Yolonda Kida, MD  DRUG ALLERGIES:   Allergies  Allergen Reactions  . Codeine Nausea Only  . Prednisone Other (See Comments)    Pts daughter states that this medication "makes her evil".      DISCHARGE MEDICATIONS:   Current Discharge Medication List    START taking these medications   Details  budesonide (PULMICORT) 0.25 MG/2ML nebulizer solution Take 2 mLs (0.25 mg total) by nebulization 2 (two) times daily. Qty: 60 mL, Refills: 0    cefUROXime (CEFTIN) 500 MG tablet Take 1 tablet (500 mg total) by mouth 2 (two) times daily with a meal. Qty: 8 tablet, Refills: 0    guaiFENesin (MUCINEX) 600 MG 12 hr tablet Take 1 tablet (600 mg total) by mouth 2 (two) times daily. Qty: 20 tablet, Refills: 0    guaiFENesin-dextromethorphan (ROBITUSSIN DM) 100-10 MG/5ML syrup Take 10 mLs by mouth every 6 (six) hours as needed for cough. Qty: 118 mL, Refills: 0    ipratropium-albuterol (  DUONEB) 0.5-2.5 (3) MG/3ML SOLN Take 3 mLs by nebulization every 6 (six) hours as needed. Qty: 300 mL, Refills: 0    saccharomyces boulardii (FLORASTOR) 250 MG capsule Take 1 capsule (250 mg total) by mouth 2 (two) times daily. Qty: 10 capsule, Refills: 0      CONTINUE these medications which have NOT CHANGED   Details  albuterol  (PROVENTIL HFA;VENTOLIN HFA) 108 (90 BASE) MCG/ACT inhaler Inhale 2 puffs into the lungs every 6 (six) hours as needed for wheezing or shortness of breath. Qty: 1 Inhaler, Refills: 2    amLODipine (NORVASC) 5 MG tablet Take 5 mg by mouth at bedtime.    aspirin EC 81 MG tablet Take 81 mg by mouth daily.    atorvastatin (LIPITOR) 10 MG tablet Take 10 mg by mouth at bedtime.    benzonatate (TESSALON) 200 MG capsule Take 200 mg by mouth 3 (three) times daily as needed for cough.    citalopram (CELEXA) 40 MG tablet Take 40 mg by mouth every evening.    clopidogrel (PLAVIX) 75 MG tablet Take 75 mg by mouth at bedtime.     donepezil (ARICEPT) 5 MG tablet Take 5 mg by mouth daily.     furosemide (LASIX) 20 MG tablet Take 20 mg by mouth daily.    glimepiride (AMARYL) 4 MG tablet Take 2 mg by mouth 2 (two) times daily.    hydrALAZINE (APRESOLINE) 25 MG tablet Take 50 mg by mouth 3 (three) times daily.    insulin detemir (LEVEMIR) 100 UNIT/ML injection Inject 30-35 Units into the skin every evening.    isosorbide mononitrate (IMDUR) 60 MG 24 hr tablet Take 90 mg by mouth 2 (two) times daily.    Liraglutide (VICTOZA) 18 MG/3ML SOPN Inject 1.8 mg into the skin daily.    ondansetron (ZOFRAN) 4 MG tablet Take 4 mg by mouth 3 (three) times daily as needed for nausea or vomiting.    polycarbophil (FIBERCON) 625 MG tablet Take 625 mg by mouth daily.    vitamin B-12 (CYANOCOBALAMIN) 1000 MCG tablet Take 1,000 mcg by mouth daily.      STOP taking these medications     amoxicillin-clavulanate (AUGMENTIN) 875-125 MG per tablet      acetaminophen-codeine (TYLENOL #3) 300-30 MG per tablet      levofloxacin (LEVAQUIN) 500 MG tablet          DISCHARGE INSTRUCTIONS:   1. PCP follow-up in 1-2 weeks 2. CHF clinic follow-up in 1 week  If you experience worsening of your admission symptoms, develop shortness of breath, life threatening emergency, suicidal or homicidal thoughts you must seek  medical attention immediately by calling 911 or calling your MD immediately  if symptoms less severe.  You Must read complete instructions/literature along with all the possible adverse reactions/side effects for all the Medicines you take and that have been prescribed to you. Take any new Medicines after you have completely understood and accept all the possible adverse reactions/side effects.   Please note  You were cared for by a hospitalist during your hospital stay. If you have any questions about your discharge medications or the care you received while you were in the hospital after you are discharged, you can call the unit and asked to speak with the hospitalist on call if the hospitalist that took care of you is not available. Once you are discharged, your primary care physician will handle any further medical issues. Please note that NO REFILLS for any discharge medications will be  authorized once you are discharged, as it is imperative that you return to your primary care physician (or establish a relationship with a primary care physician if you do not have one) for your aftercare needs so that they can reassess your need for medications and monitor your lab values.    Today   CHIEF COMPLAINT:   Chief Complaint  Patient presents with  . Shortness of Breath    VITAL SIGNS:  Blood pressure 161/49, pulse 61, temperature 98.1 F (36.7 C), temperature source Oral, resp. rate 20, height 5\' 1"  (1.549 m), weight 85.866 kg (189 lb 4.8 oz), SpO2 92 %.  I/O:   Intake/Output Summary (Last 24 hours) at 11/17/14 1117 Last data filed at 11/17/14 1037  Gross per 24 hour  Intake    240 ml  Output   1180 ml  Net   -940 ml    PHYSICAL EXAMINATION:   Physical Exam  GENERAL:  69 y.o.-year-old patient lying in the bed with no acute distress. Dyspneic on exertion. EYES: Pupils equal, round, reactive to light and accommodation. No scleral icterus. Extraocular muscles intact.  HEENT: Head  atraumatic, normocephalic. Oropharynx and nasopharynx clear.  NECK:  Supple, no jugular venous distention. No thyroid enlargement, no tenderness.  LUNGS: Normal breath sounds bilaterally, no wheezing, rhonchi. No use of accessory muscles of respiration at rest. Use of accessory muscles noted after walking. Mild basilar crackles heard on exam. CARDIOVASCULAR: S1, S2 normal. Norubs, or gallops.  3/6 systolic murmur heard ABDOMEN: Soft, non-tender, non-distended. Bowel sounds present. No organomegaly or mass.  EXTREMITIES: No pedal edema, cyanosis, or clubbing.  NEUROLOGIC: Cranial nerves II through XII are intact. Muscle strength 5/5 in all extremities. Sensation intact. Gait not checked.  PSYCHIATRIC: The patient is alert and oriented x 3.  SKIN: No obvious rash, lesion, or ulcer.   DATA REVIEW:   CBC  Recent Labs Lab 11/15/14 0445  WBC 12.5*  HGB 9.1*  HCT 27.6*  PLT 287    Chemistries   Recent Labs Lab 11/10/14 1302  11/16/14 1018  NA 138  < > 137  K 4.2  < > 3.5  CL 106  < > 102  CO2 25  < > 24  GLUCOSE 205*  < > 192*  BUN 41*  < > 90*  CREATININE 2.75*  < > 3.11*  CALCIUM 8.3*  < > 8.3*  MG 1.6*  --   --   < > = values in this interval not displayed.  Cardiac Enzymes No results for input(s): TROPONINI in the last 168 hours.  Microbiology Results  Results for orders placed or performed during the hospital encounter of 11/07/14  Culture, blood (routine x 2)     Status: None   Collection Time: 11/07/14 12:30 PM  Result Value Ref Range Status   Specimen Description BLOOD RIGHT ASSIST CONTROL  Final   Special Requests   Final    BOTTLES DRAWN AEROBIC AND ANAEROBIC  2CC AERO 1 CC ANAERO   Culture NO GROWTH 5 DAYS  Final   Report Status 11/12/2014 FINAL  Final  Culture, blood (routine x 2)     Status: None   Collection Time: 11/07/14 12:48 PM  Result Value Ref Range Status   Specimen Description BLOOD LEFT FATTY CASTS  Final   Special Requests BOTTLES DRAWN  AEROBIC AND ANAEROBIC  1CC  Final   Culture NO GROWTH 5 DAYS  Final   Report Status 11/12/2014 FINAL  Final  MRSA PCR Screening  Status: None   Collection Time: 11/07/14  6:06 PM  Result Value Ref Range Status   MRSA by PCR NEGATIVE NEGATIVE Final    Comment:        The GeneXpert MRSA Assay (FDA approved for NASAL specimens only), is one component of a comprehensive MRSA colonization surveillance program. It is not intended to diagnose MRSA infection nor to guide or monitor treatment for MRSA infections.   C difficile quick scan w PCR reflex     Status: None   Collection Time: 11/08/14  4:15 AM  Result Value Ref Range Status   C Diff antigen NEGATIVE NEGATIVE Final   C Diff toxin NEGATIVE NEGATIVE Final   C Diff interpretation Negative for C. difficile  Final    RADIOLOGY:  Dg Chest 1 View  11/16/2014   CLINICAL DATA:  Follow-up pneumonia  EXAM: CHEST 1 VIEW  COMPARISON:  11/14/2014  FINDINGS: Cardiomediastinal silhouette is stable. Right IJ catheter with tip in right atrium. No pulmonary edema. Again noted patchy bilateral airspace disease without change in aeration  IMPRESSION: No pulmonary edema. Right IJ catheter with tip in right atrium. Again noted patchy bilateral airspace disease without change in aeration.   Electronically Signed   By: Lahoma Crocker M.D.   On: 11/16/2014 10:49    EKG:   Orders placed or performed during the hospital encounter of 11/07/14  . EKG 12-Lead  . EKG 12-Lead  . ED EKG  . ED EKG  . ED EKG  . ED EKG  . ED EKG  . ED EKG  . ED EKG  . ED EKG      Management plans discussed with the patient, family and they are in agreement.  CODE STATUS:     Code Status Orders        Start     Ordered   11/07/14 1426  Full code   Continuous     11/07/14 1426      TOTAL TIME TAKING CARE OF THIS PATIENT: 36 minutes.    Gladstone Lighter M.D on 11/17/2014 at 11:17 AM  Between 7am to 6pm - Pager - [phone]  After 6pm go to  [email or web address] - password [email or web address] [facility]  Office  [phone]  CC: Primary care physician; Lavera Guise, MD

## 2014-11-17 NOTE — Progress Notes (Signed)
Per night shift RN, patient ambulated to bedroom door and out into the hallway a few feet with daughter and aide present. Per night RN and family, sats stays in 23s on 2L O2.

## 2014-11-17 NOTE — Progress Notes (Signed)
SATURATION QUALIFICATIONS: (This note is used to comply with regulatory documentation for home oxygen)  Patient Saturations on Room Air at Rest = 83%  Patient Saturations on Room Air while Ambulating = not attempted, unsafe to proceed with exertion as sats <85% at rest on RA  Patient Saturations on 2 Liters of oxygen while Ambulating = 92%  Please briefly explain why patient needs home oxygen:

## 2014-11-17 NOTE — Discharge Instructions (Signed)
Heart Failure Clinic appointment on November 30, 2014 at 9:00am with Felicia Vincent, Felicia Vincent. Please call [phone] to reschedule.  Heart Failure Heart failure is a condition in which the heart has trouble pumping blood. This means your heart does not pump blood efficiently for your body to work well. In some cases of heart failure, fluid may back up into your lungs or you may have swelling (edema) in your lower legs. Heart failure is usually a long-term (chronic) condition. It is important for you to take good care of yourself and follow your health care provider's treatment plan. CAUSES  Some health conditions can cause heart failure. Those health conditions include:  High blood pressure (hypertension). Hypertension causes the heart muscle to work harder than normal. When pressure in the blood vessels is high, the heart needs to pump (contract) with more force in order to circulate blood throughout the body. High blood pressure eventually causes the heart to become stiff and weak.  Coronary artery disease (CAD). CAD is the buildup of cholesterol and fat (plaque) in the arteries of the heart. The blockage in the arteries deprives the heart muscle of oxygen and blood. This can cause chest pain and may lead to a heart attack. High blood pressure can also contribute to CAD.  Heart attack (myocardial infarction). A heart attack occurs when one or more arteries in the heart become blocked. The loss of oxygen damages the muscle tissue of the heart. When this happens, part of the heart muscle dies. The injured tissue does not contract as well and weakens the heart's ability to pump blood.  Abnormal heart valves. When the heart valves do not open and close properly, it can cause heart failure. This makes the heart muscle pump harder to keep the blood flowing.  Heart muscle disease (cardiomyopathy or myocarditis). Heart muscle disease is damage to the heart muscle from a variety of causes. These can include drug or  alcohol abuse, infections, or unknown reasons. These can increase the risk of heart failure.  Lung disease. Lung disease makes the heart work harder because the lungs do not work properly. This can cause a strain on the heart, leading it to fail.  Diabetes. Diabetes increases the risk of heart failure. High blood sugar contributes to high fat (lipid) levels in the blood. Diabetes can also cause slow damage to tiny blood vessels that carry important nutrients to the heart muscle. When the heart does not get enough oxygen and food, it can cause the heart to become weak and stiff. This leads to a heart that does not contract efficiently.  Other conditions can contribute to heart failure. These include abnormal heart rhythms, thyroid problems, and low blood counts (anemia). Certain unhealthy behaviors can increase the risk of heart failure, including:  Being overweight.  Smoking or chewing tobacco.  Eating foods high in fat and cholesterol.  Abusing illicit drugs or alcohol.  Lacking physical activity. SYMPTOMS  Heart failure symptoms may vary and can be hard to detect. Symptoms may include: 1. Shortness of breath with activity, such as climbing stairs. 2. Persistent cough. 3. Swelling of the feet, ankles, legs, or abdomen. 4. Unexplained weight gain. 5. Difficulty breathing when lying flat (orthopnea). 6. Waking from sleep because of the need to sit up and get more air. 7. Rapid heartbeat. 8. Fatigue and loss of energy. 9. Feeling light-headed, dizzy, or close to fainting. 10. Loss of appetite. 11. Nausea. 12. Increased urination during the night (nocturia). DIAGNOSIS  A diagnosis of heart  failure is based on your history, symptoms, physical examination, and diagnostic tests. Diagnostic tests for heart failure may include:  Echocardiography.  Electrocardiography.  Chest X-ray.  Blood tests.  Exercise stress test.  Cardiac angiography.  Radionuclide scans. TREATMENT    Treatment is aimed at managing the symptoms of heart failure. Medicines, behavioral changes, or surgical intervention may be necessary to treat heart failure.  Medicines to help treat heart failure may include:  Angiotensin-converting enzyme (ACE) inhibitors. This type of medicine blocks the effects of a blood protein called angiotensin-converting enzyme. ACE inhibitors relax (dilate) the blood vessels and help lower blood pressure.  Angiotensin receptor blockers (ARBs). This type of medicine blocks the actions of a blood protein called angiotensin. Angiotensin receptor blockers dilate the blood vessels and help lower blood pressure.  Water pills (diuretics). Diuretics cause the kidneys to remove salt and water from the blood. The extra fluid is removed through urination. This loss of extra fluid lowers the volume of blood the heart pumps.  Beta blockers. These prevent the heart from beating too fast and improve heart muscle strength.  Digitalis. This increases the force of the heartbeat.  Healthy behavior changes include:  Obtaining and maintaining a healthy weight.  Stopping smoking or chewing tobacco.  Eating heart-healthy foods.  Limiting or avoiding alcohol.  Stopping illicit drug use.  Physical activity as directed by your health care provider.  Surgical treatment for heart failure may include:  A procedure to open blocked arteries, repair damaged heart valves, or remove damaged heart muscle tissue.  A pacemaker to improve heart muscle function and control certain abnormal heart rhythms.  An internal cardioverter defibrillator to treat certain serious abnormal heart rhythms.  A left ventricular assist device (LVAD) to assist the pumping ability of the heart. HOME CARE INSTRUCTIONS   Take medicines only as directed by your health care provider. Medicines are important in reducing the workload of your heart, slowing the progression of heart failure, and improving your  symptoms.  Do not stop taking your medicine unless directed by your health care provider.  Do not skip any dose of medicine.  Refill your prescriptions before you run out of medicine. Your medicines are needed every day.  Engage in moderate physical activity if directed by your health care provider. Moderate physical activity can benefit some people. The elderly and people with severe heart failure should consult with a health care provider for physical activity recommendations.  Eat heart-healthy foods. Food choices should be free of trans fat and low in saturated fat, cholesterol, and salt (sodium). Healthy choices include fresh or frozen fruits and vegetables, fish, lean meats, legumes, fat-free or low-fat dairy products, and whole grain or high fiber foods. Talk to a dietitian to learn more about heart-healthy foods.  Limit sodium if directed by your health care provider. Sodium restriction may reduce symptoms of heart failure in some people. Talk to a dietitian to learn more about heart-healthy seasonings.  Use healthy cooking methods. Healthy cooking methods include roasting, grilling, broiling, baking, poaching, steaming, or stir-frying. Talk to a dietitian to learn more about healthy cooking methods.  Limit fluids if directed by your health care provider. Fluid restriction may reduce symptoms of heart failure in some people.  Weigh yourself every day. Daily weights are important in the early recognition of excess fluid. You should weigh yourself every morning after you urinate and before you eat breakfast. Wear the same amount of clothing each time you weigh yourself. Record your daily weight. Provide  your health care provider with your weight record.  Monitor and record your blood pressure if directed by your health care provider.  Check your pulse if directed by your health care provider.  Lose weight if directed by your health care provider. Weight loss may reduce symptoms of heart  failure in some people.  Stop smoking or chewing tobacco. Nicotine makes your heart work harder by causing your blood vessels to constrict. Do not use nicotine gum or patches before talking to your health care provider.  Keep all follow-up visits as directed by your health care provider. This is important.  Limit alcohol intake to no more than 1 drink per day for nonpregnant women and 2 drinks per day for men. One drink equals 12 ounces of beer, 5 ounces of wine, or 1 ounces of hard liquor. Drinking more than that is harmful to your heart. Tell your health care provider if you drink alcohol several times a week. Talk with your health care provider about whether alcohol is safe for you. If your heart has already been damaged by alcohol or you have severe heart failure, drinking alcohol should be stopped completely.  Stop illicit drug use.  Stay up-to-date with immunizations. It is especially important to prevent respiratory infections through current pneumococcal and influenza immunizations.  Manage other health conditions such as hypertension, diabetes, thyroid disease, or abnormal heart rhythms as directed by your health care provider.  Learn to manage stress.  Plan rest periods when fatigued.  Learn strategies to manage high temperatures. If the weather is extremely hot:  Avoid vigorous physical activity.  Use air conditioning or fans or seek a cooler location.  Avoid caffeine and alcohol.  Wear loose-fitting, lightweight, and light-colored clothing.  Learn strategies to manage cold temperatures. If the weather is extremely cold:  Avoid vigorous physical activity.  Layer clothes.  Wear mittens or gloves, a hat, and a scarf when going outside.  Avoid alcohol.  Obtain ongoing education and support as needed.  Participate in or seek rehabilitation as needed to maintain or improve independence and quality of life. SEEK MEDICAL CARE IF:   Your weight increases by 03 lb/1.4 kg  in 1 day or 05 lb/2.3 kg in a week.  You have increasing shortness of breath that is unusual for you.  You are unable to participate in your usual physical activities.  You tire easily.  You cough more than normal, especially with physical activity.  You have any or more swelling in areas such as your hands, feet, ankles, or abdomen.  You are unable to sleep because it is hard to breathe.  You feel like your heart is beating fast (palpitations).  You become dizzy or light-headed upon standing up. SEEK IMMEDIATE MEDICAL CARE IF:   You have difficulty breathing.  There is a change in mental status such as decreased alertness or difficulty with concentration.  You have a pain or discomfort in your chest.  You have an episode of fainting (syncope). MAKE SURE YOU:   Understand these instructions.  Will watch your condition.  Will get help right away if you are not doing well or get worse. Document Released: 02/10/2005 Document Revised: 06/27/2013 Document Reviewed: 03/12/2012 [facility] Patient Information 2015 [place], [place]. This information is not intended to replace advice given to you by your health care provider. Make sure you discuss any questions you have with your health care provider.  Daily Weight Record It is important to weigh yourself daily. Keep this daily weight chart  near your scale. Weigh yourself each morning at the same time. Weigh yourself without shoes and with the same amount of clothes each day. Compare today's weight to yesterday's weight. Bring this form with you to your follow-up appointments. Call your caregiver if you gain 03 lb/1.4 kg in 1 day. Call your caregiver if you gain 05 lb/2.3 kg in a week. Date: ________ Weight: ____________________ Date: ________ Weight: ____________________ Date: ________ Weight: ____________________ Date: ________ Weight: ____________________ Date: ________ Weight: ____________________ Date: ________ Weight:  ____________________ Date: ________ Weight: ____________________ Date: ________ Weight: ____________________ Date: ________ Weight: ____________________ Date: ________ Weight: ____________________ Date: ________ Weight: ____________________ Date: ________ Weight: ____________________ Date: ________ Weight: ____________________ Date: ________ Weight: ____________________ Date: ________ Weight: ____________________ Date: ________ Weight: ____________________ Date: ________ Weight: ____________________ Date: ________ Weight: ____________________ Date: ________ Weight: ____________________ Date: ________ Weight: ____________________ Date: ________ Weight: ____________________ Date: ________ Weight: ____________________ Date: ________ Weight: ____________________ Date: ________ Weight: ____________________ Date: ________ Weight: ____________________ Date: ________ Weight: ____________________ Date: ________ Weight: ____________________ Date: ________ Weight: ____________________ Date: ________ Weight: ____________________ Date: ________ Weight: ____________________ Date: ________ Weight: ____________________ Date: ________ Weight: ____________________ Date: ________ Weight: ____________________ Date: ________ Weight: ____________________ Date: ________ Weight: ____________________ Date: ________ Weight: ____________________ Date: ________ Weight: ____________________ Date: ________ Weight: ____________________ Date: ________ Weight: ____________________ Date: ________ Weight: ____________________ Date: ________ Weight: ____________________ Date: ________ Weight: ____________________ Date: ________ Weight: ____________________ Date: ________ Weight: ____________________ Date: ________ Weight: ____________________ Date: ________ Weight: ____________________ Date: ________ Weight: ____________________ Date: ________ Weight: ____________________ Date: ________ Weight: ____________________ Date: ________  Weight: ____________________ Document Released: 04/24/2006 Document Revised: 05/05/2011 Document Reviewed: 01/29/2007 [employer] Patient Information 2015 [employer]. This information is not intended to replace advice given to you by your health care provider. Make sure you discuss any questions you have with your health care provider.  Posture Awareness   Stand and check posture: Jut chin, pull back to comfortable position. Tilt pelvis forward, back; be sure back is not swayed. Roll from heels to balls of feet, then distribute your weight evenly. Picture a line through spine pulling you erect. Focus on breathing. Good Posture = Better Breathing. Check ____ times per day.  http://gt2.exer.us/874   Copyright  VHI. All rights reserved.   Incentive Spirometer An incentive spirometer is a tool that can help keep your lungs clear and active. This tool measures how well you are filling your lungs with each breath. Taking long, deep breaths may help reverse or decrease the chance of developing breathing (pulmonary) problems (especially infection) following:  Surgery of the chest or abdomen.  Surgery if you have a history of smoking or a lung problem.  A long period of time when you are unable to move or be active. BEFORE THE PROCEDURE   If the spirometer includes an indicator to show your best effort, your nurse or respiratory therapist will set it to a desired goal.  If possible, sit up straight or lean slightly forward. Try not to slouch.  Hold the incentive spirometer in an upright position. INSTRUCTIONS FOR USE  13. Sit on the edge of your bed if possible, or sit up as far as you can in bed or on a chair. 14. Hold the incentive spirometer in an upright position. 15. Breathe out normally. 16. Place the mouthpiece in your mouth and seal your lips tightly around it. 17. Breathe in slowly and as deeply as possible, raising the piston or the ball toward the top of the column. 18. Hold  your breath for 3-5 seconds or  for as long as possible. Allow the piston or ball to fall to the bottom of the column. 19. Remove the mouthpiece from your mouth and breathe out normally. 20. Rest for a few seconds and repeat Steps 1 through 7 at least 10 times every 1-2 hours when you are awake. Take your time and take a few normal breaths between deep breaths. 21. The spirometer may include an indicator to show your best effort. Use the indicator as a goal to work toward during each repetition. 22. After each set of 10 deep breaths, practice coughing to be sure your lungs are clear. If you have an incision (the cut made at the time of surgery), support your incision when coughing by placing a pillow or rolled-up towels firmly against it. Once you are able to get out of bed, walk around indoors and cough well. You may stop using the incentive spirometer when instructed by your caregiver.  RISKS AND COMPLICATIONS  Breathing too quickly may cause dizziness. At an extreme, this could cause you to pass out. Take your time so you do not get dizzy or light-headed.  If you are in pain, you may need to take or ask for pain medication before doing incentive spirometry. It is harder to take a deep breath if you are having pain. AFTER USE  Rest and breathe slowly and easily.  It can be helpful to keep a log of your progress. Your caregiver can provide you with a simple table to help with this. If you are using the spirometer at home, follow these instructions: [place] IF:   You are having difficultly using the spirometer.  You have trouble using the spirometer as often as instructed.  Your pain medication is not giving enough relief while using the spirometer.  You develop fever of 100.109F (38.1C) or higher. SEEK IMMEDIATE MEDICAL CARE IF:   You cough up bloody sputum that had not been present before.  You develop fever of 102F (38.9C) or greater.  You develop worsening pain at or  near the incision site. MAKE SURE YOU:   Understand these instructions.  Will watch your condition.  Will get help right away if you are not doing well or get worse. Document Released: 06/23/2006 Document Revised: 06/27/2013 Document Reviewed: 08/24/2006 [facility] Patient Information 2015 [place], [place]. This information is not intended to replace advice given to you by your health care provider. Make sure you discuss any questions you have with your health care provider.   [facility] TO PROVIDE- HOME NEBULIZER MACHINE, BSC, WALKER, HOME 02, SN PT, OT AIDE AND SW.  CALL [facility] IF DO NOT HEAR FORM THEM BY 9/24.  [phone]

## 2014-11-17 NOTE — Progress Notes (Signed)
Physical Therapy Treatment Patient Details Name: Felicia Vincent MRN: [id number] DOB: February 16, 1946 Today's Date: 11/17/2014    History of Present Illness Pt is a 69 y.o. female presenting to hospital with SOB and placed on BiPAP.  Pt admitted to hospital with B PNA with acute and progressive hypoxic respiratory failure from PNA with progressive renal failure.  Pt s/p 1 unit PRBC's d/t decrease in Hgb.  Pt transitioned to high flow nasal cannula but was emergently intubated 9/16 and extubated 11/13/14.  PMH includes HTN, DM type 2, chronic kidney disease, h/o previous CVA, TIA.    PT Comments    Pt ambulated with RW and CGA 50 feet (O2 decreased to 85-86% on 3 L/min via nasal cannula) and then 40 feet (O2 decreased to 86-87% on 3 L/min via nasal cannula).  O2 at rest was 94-95% on 2 L/min via nasal cannula and increased to 3 L/min for ambulation per discussion with nursing.  Pt given vc's for purse lip breathing throughout ambulation but distance limited d/t O2 desaturation and pt also appearing to get fatigued with distance.  Pt would benefit from STR for pulmonary rehab but pt's daughter plans to take pt home with 24/7 assist.  Care management notified of pt's need for RW and bedside commode to increase safety of home discharge (continue to recommend pulmonary rehab upon hospital discharge though).   Follow Up Recommendations  SNF (Pulmonary rehab)     Equipment Recommendations  Rolling walker with 5" wheels;3in1 (PT)    Recommendations for Other Services       Precautions / Restrictions Precautions Precautions: Fall Precaution Comments: monitor O2 closely Restrictions Weight Bearing Restrictions: No    Mobility  Bed Mobility               General bed mobility comments: Not assessed d/t pt already sitting up in bedside chair.  Transfers Overall transfer level: Needs assistance Equipment used: Rolling walker (2 wheeled) Transfers: Sit to/from Stand Sit to Stand: Min guard         General transfer comment: steady without loss of balance  Ambulation/Gait Ambulation/Gait assistance: Min guard Ambulation Distance (Feet):  (50 feet; 40 feet) Assistive device: Rolling walker (2 wheeled)   Gait velocity: decreased   General Gait Details: decreased B step length/foot clearance/heelstrike; steady slow gait; vc's required for purse lip breathing for optimal O2 sats   Stairs            Wheelchair Mobility    Modified Rankin (Stroke Patients Only)       Balance Overall balance assessment: Needs assistance Sitting-balance support: No upper extremity supported;Feet supported Sitting balance-Leahy Scale: Good     Standing balance support: Bilateral upper extremity supported (on RW) Standing balance-Leahy Scale: Fair                      Cognition Arousal/Alertness: Awake/alert Behavior During Therapy: WFL for tasks assessed/performed Overall Cognitive Status: Within Functional Limits for tasks assessed                      Exercises      General Comments   Nursing cleared pt for participation in physical therapy.  Pt agreeable to PT session.  Pt's daughter present during session and requesting pt discharge home today (MD in end of session discussing discharge with family).      Pertinent Vitals/Pain Pain Assessment: No/denies pain  See O2 sats above; HR WFL.    Home Living  Prior Function            PT Goals (current goals can now be found in the care plan section) Acute Rehab PT Goals Patient Stated Goal: To be able to walk again PT Goal Formulation: With patient Time For Goal Achievement: 11/28/14 Potential to Achieve Goals: Good Progress towards PT goals: Progressing toward goals    Frequency  Min 2X/week    PT Plan Current plan remains appropriate    Co-evaluation             End of Session Equipment Utilized During Treatment: Gait belt;Oxygen Activity Tolerance: Other  (comment) (Limited d/t O2 desaturation with activity) Patient left: in chair;with call bell/phone within reach;with nursing/sitter in room;with family/visitor present (nursing reported she would place chair alarm on for pt)     Time: 1010-1045 PT Time Calculation (min) (ACUTE ONLY): 35 min  Charges:  $Gait Training: 23-37 mins                    G CodesLeitha Bleak December 15, 2014, 2:07 PM Leitha Bleak, [place]

## 2014-11-17 NOTE — Care Management Important Message (Signed)
Important Message  Patient Details  Name: Felicia Vincent MRN: [id number] Date of Birth: [birth year]   Medicare Important Message Given:  Yes-second notification given    Darius Bump [place]mond 11/17/2014, 9:29 AM

## 2014-11-17 NOTE — Care Management (Signed)
Patient to discharge home today.  Family continue to decline skilled nursing placement. Reviewed that home health staff time in the home is not "time specific" and should not assume that staff time in the home is  for inhome continuous care.  [facility] will provide home visits under [facility]: SN PT OT Aide, SW; DME- home 02, home nebulizer, walker and BSC.

## 2014-11-17 NOTE — Progress Notes (Signed)
RN verified with patients pharmacy that her prescriptions have been sent there.

## 2014-11-17 NOTE — Progress Notes (Signed)
Pt. Discharged to home w hh, pt, ot and etc. She is taking home with her: her o2 tank, bsc, walker and nebulizer. Discharge instructions and medication regimen reviewed at bedside with patient and daughter. Both verbalize understanding of instructions and medication regimen. Patient assessment unchanged from this morning. TELE and central line discontinued per policy.  Patients home medication, victoza, returned to patients daughter at time of discharge.

## 2014-11-17 NOTE — Progress Notes (Signed)
Inpatient Diabetes Program Recommendations  AACE/ADA: New Consensus Statement on Inpatient Glycemic Control (2015)  Target Ranges:  Prepandial:   less than 140 mg/dL      Peak postprandial:   less than 180 mg/dL (1-2 hours)      Critically ill patients:  140 - 180 mg/dL   Review of Glycemic Control  Home DM Meds: Levemir 30-35 units QHS  Amaryl 2 mg bid  Victoza 1.8 mg daily  Current DM Orders: Lantus 20 units bid  Novolog 0-15 units tid, Novolog 0-5 units tid    Since patient is no longer on steroids, consider decreasing Novolog correction to 0-9 units tid with meals.   [name], RN, BA, MHA, CDE Diabetes Coordinator Inpatient Diabetes Program  [phone] (Team Pager) [phone] ([place]) 11/17/2014 11:29 AM

## 2014-11-17 NOTE — Progress Notes (Signed)
[facility] Progess Note    ASSESSMENT/PLAN    69 yo white female with acute and progressive hypoxic resp failure From pneumonia.  PULMONARY A: -Respiratory Failure due to pneumonia -doing better today, O2 sat is now 94% on 1 L nasal cannula. -continue Bronchodilator Therapy -wean down steroids. -Currently on antibiotics. Would favor shorter course of 5-7 days. -Advance activity as tolerated, OK to discharge from respiratory standpoint.   --Follow up outpatient in 3 to 4 weeks, patient was given our card.    ADMISSION DATE: 11/07/2014  INITIAL PRESENTATION:  84 F initially seen as outpt one week PTA with complaints of weakness, dyspnea, cough and treated for PNA as outpt with levofloxacin. Was intolerant of this medication due to nausea and was changed to amoxicillin 4 days PTA. Seen in ED 9/13 with worsening dyspnea and admitted with dx of acute hypoxic resp failure due to CAP   ---------------------------------------   ----------------------------------------   Name: Felicia Vincent MRN: [id number] DOB: [birth year]    ADMISSION DATE:  11/07/2014  CHIEF COMPLAINT:  Dyspnea  SUBJECTIVE:  Patient feeling and looking better today. No new complaints.  Review of Systems:  Constitutional: Feels well. Cardiovascular: No chest pain.  Pulmonary: Denies dyspnea.   The remainder of systems were reviewed and were found to be negative other than what is documented in the HPI.    VITAL SIGNS: Temp:  [97.7 F (36.5 C)-98.1 F (36.7 C)] 97.7 F (36.5 C) (09/23 1130) Pulse Rate:  [53-61] 55 (09/23 1130) Resp:  [18-20] 20 (09/23 1130) BP: (149-161)/(41-53) 149/41 mmHg (09/23 1130) SpO2:  [83 %-98 %] 97 % (09/23 1130) Weight:  [85.503 kg (188 lb 8 oz)-85.866 kg (189 lb 4.8 oz)] 85.866 kg (189 lb 4.8 oz) (09/23 0819) HEMODYNAMICS:   VENTILATOR SETTINGS:   INTAKE / OUTPUT:  Intake/Output Summary (Last 24 hours) at 11/17/14 1144 Last data filed at  11/17/14 1037  Gross per 24 hour  Intake    240 ml  Output   1180 ml  Net   -940 ml    PHYSICAL EXAMINATION: Physical Examination:   VS: BP 149/41 mmHg  Pulse 55  Temp(Src) 97.7 F (36.5 C) (Oral)  Resp 20  Ht 5\' 1"  (1.549 m)  Wt 85.866 kg (189 lb 4.8 oz)  BMI 35.79 kg/m2  SpO2 97%  General Appearance: No distress  Neuro:without focal findings, mental status normal. HEENT: PERRLA, EOM intact. Pulmonary: normal breath sounds   CardiovascularNormal S1,S2.  No m/r/g.   Abdomen: Benign, Soft, non-tender. Renal:  No costovertebral tenderness  GU:  Not performed at this time. Endocrine: No evident thyromegaly. Skin:   warm, no rashes, no ecchymosis  Extremities: normal, no cyanosis, clubbing.   LABS:   LABORATORY PANEL:   CBC  Recent Labs Lab 11/15/14 0445  WBC 12.5*  HGB 9.1*  HCT 27.6*  PLT 287    Chemistries   Recent Labs Lab 11/10/14 1302  11/11/14 0439  11/16/14 1018  NA 138  < > 137  < > 137  K 4.2  < > 3.6  < > 3.5  CL 106  < > 105  < > 102  CO2 25  < > 21*  < > 24  GLUCOSE 205*  < > 349*  < > 192*  BUN 41*  < > 61*  < > 90*  CREATININE 2.75*  < > 3.21*  < > 3.11*  CALCIUM 8.3*  < > 8.1*  < > 8.3*  MG  1.6*  --   --   --   --   PHOS  --   --  3.8  --   --   < > = values in this interval not displayed.   Recent Labs Lab 11/16/14 0756 11/16/14 1211 11/16/14 1624 11/16/14 1951 11/17/14 0719 11/17/14 1129  GLUCAP 89 190* 200* 313* 87 295*    Recent Labs Lab 11/11/14 0830 11/13/14 0905 11/14/14 0434  PHART 7.36 7.37 7.49*  PCO2ART 36 43 39  PO2ART 50* 82* 63*    Recent Labs Lab 11/11/14 0439  ALBUMIN 1.7*    Cardiac Enzymes No results for input(s): TROPONINI in the last 168 hours.  RADIOLOGY:  Dg Chest 1 View  11/16/2014   CLINICAL DATA:  Follow-up pneumonia  EXAM: CHEST 1 VIEW  COMPARISON:  11/14/2014  FINDINGS: Cardiomediastinal silhouette is stable. Right IJ catheter with tip in right atrium. No pulmonary edema. Again  noted patchy bilateral airspace disease without change in aeration  IMPRESSION: No pulmonary edema. Right IJ catheter with tip in right atrium. Again noted patchy bilateral airspace disease without change in aeration.   Electronically Signed   By: Lahoma Crocker M.D.   On: 11/16/2014 10:49       --Marda Stalker, MD.  Pager [phone] [facility] Office Number: [phone]  Patricia Pesa, M.D.  Vilinda Boehringer, M.D.  Merton Border, M.D

## 2014-11-20 ENCOUNTER — Other Ambulatory Visit: Payer: Self-pay | Admitting: *Deleted

## 2014-11-20 DIAGNOSIS — D638 Anemia in other chronic diseases classified elsewhere: Secondary | ICD-10-CM | POA: Diagnosis not present

## 2014-11-20 DIAGNOSIS — Z9981 Dependence on supplemental oxygen: Secondary | ICD-10-CM | POA: Diagnosis not present

## 2014-11-20 DIAGNOSIS — Z794 Long term (current) use of insulin: Secondary | ICD-10-CM | POA: Diagnosis not present

## 2014-11-20 DIAGNOSIS — J189 Pneumonia, unspecified organism: Secondary | ICD-10-CM | POA: Diagnosis not present

## 2014-11-20 DIAGNOSIS — Z8673 Personal history of transient ischemic attack (TIA), and cerebral infarction without residual deficits: Secondary | ICD-10-CM | POA: Diagnosis not present

## 2014-11-20 DIAGNOSIS — N189 Chronic kidney disease, unspecified: Secondary | ICD-10-CM | POA: Diagnosis not present

## 2014-11-20 DIAGNOSIS — E1122 Type 2 diabetes mellitus with diabetic chronic kidney disease: Secondary | ICD-10-CM | POA: Diagnosis not present

## 2014-11-20 DIAGNOSIS — I5031 Acute diastolic (congestive) heart failure: Secondary | ICD-10-CM | POA: Diagnosis not present

## 2014-11-20 DIAGNOSIS — I129 Hypertensive chronic kidney disease with stage 1 through stage 4 chronic kidney disease, or unspecified chronic kidney disease: Secondary | ICD-10-CM | POA: Diagnosis not present

## 2014-11-20 NOTE — Patient Outreach (Signed)
Attempt (first) made to contact pt, f/u for transition of care (discharged 9/23).   HIPPA compliant voice message left with contact number.   If no response, will try again tomorrow.      Zara Chess.   [facility]  [phone]

## 2014-11-21 ENCOUNTER — Other Ambulatory Visit: Payer: Self-pay | Admitting: *Deleted

## 2014-11-21 NOTE — Patient Outreach (Signed)
Second attempt made to contact pt for transition of care (discharged 9/23).  HIPPA compliant voice message left with contact number.  If no response today, will f/u again tomorrow.     Zara Chess.   [place] [facility]  [phone]

## 2014-11-22 ENCOUNTER — Other Ambulatory Visit: Payer: Self-pay | Admitting: *Deleted

## 2014-11-22 DIAGNOSIS — Z8673 Personal history of transient ischemic attack (TIA), and cerebral infarction without residual deficits: Secondary | ICD-10-CM | POA: Diagnosis not present

## 2014-11-22 DIAGNOSIS — E1122 Type 2 diabetes mellitus with diabetic chronic kidney disease: Secondary | ICD-10-CM | POA: Diagnosis not present

## 2014-11-22 DIAGNOSIS — D638 Anemia in other chronic diseases classified elsewhere: Secondary | ICD-10-CM | POA: Diagnosis not present

## 2014-11-22 DIAGNOSIS — Z794 Long term (current) use of insulin: Secondary | ICD-10-CM | POA: Diagnosis not present

## 2014-11-22 DIAGNOSIS — I5031 Acute diastolic (congestive) heart failure: Secondary | ICD-10-CM | POA: Diagnosis not present

## 2014-11-22 DIAGNOSIS — J189 Pneumonia, unspecified organism: Secondary | ICD-10-CM | POA: Diagnosis not present

## 2014-11-22 DIAGNOSIS — I129 Hypertensive chronic kidney disease with stage 1 through stage 4 chronic kidney disease, or unspecified chronic kidney disease: Secondary | ICD-10-CM | POA: Diagnosis not present

## 2014-11-22 DIAGNOSIS — Z9981 Dependence on supplemental oxygen: Secondary | ICD-10-CM | POA: Diagnosis not present

## 2014-11-22 DIAGNOSIS — N189 Chronic kidney disease, unspecified: Secondary | ICD-10-CM | POA: Diagnosis not present

## 2014-11-22 NOTE — Patient Outreach (Signed)
Transition of care call (week 1- discharged 9/23).    Spoke with Felicia Vincent, HIPPA verified.   Felicia Vincent reports doing good, to f/u with Dr. Humphrey Rolls tomorrow.  Felicia Vincent states daughter, a nurse fixes her pill box- a weekly supply. Felicia Vincent states compliant with medications.   Felicia Vincent states son lives with her, daughter lives close by.  Felicia Vincent reports  sugars are good, 160 this am- drank orange juice, normally range 78-100, on O2 2LNC.   Discussed with Felicia Vincent [facility] services, plan to f/u with weekly phone calls - 31 days from day of discharge to which Felicia Vincent agreed.    Plan to f/u again telephonically 10/6 - part of ongoing transition of care.     Zara Chess.   [facility]  [phone]

## 2014-11-22 NOTE — Patient Outreach (Signed)
Transition of care call:  Person answering the phone states pt's grandson, pt currently taking a bath.  RN CM to call back.    Zara Chess.   [facility]  [phone]

## 2014-11-23 ENCOUNTER — Other Ambulatory Visit: Payer: Self-pay | Admitting: Pharmacist

## 2014-11-23 DIAGNOSIS — R0602 Shortness of breath: Secondary | ICD-10-CM | POA: Diagnosis not present

## 2014-11-23 DIAGNOSIS — Z9981 Dependence on supplemental oxygen: Secondary | ICD-10-CM | POA: Diagnosis not present

## 2014-11-23 DIAGNOSIS — N179 Acute kidney failure, unspecified: Secondary | ICD-10-CM | POA: Diagnosis not present

## 2014-11-23 DIAGNOSIS — J189 Pneumonia, unspecified organism: Secondary | ICD-10-CM | POA: Diagnosis not present

## 2014-11-23 DIAGNOSIS — N184 Chronic kidney disease, stage 4 (severe): Secondary | ICD-10-CM | POA: Diagnosis not present

## 2014-11-23 NOTE — Patient Outreach (Addendum)
[facility]) Care Management  3/87/5643  Felicia Vincent [birth year] [id number]   Felicia Vincent is a 69 yo referred to [place] from [name], [place] with [facility].  I made my first outreach call to Ms. Heims.  There were no answer.  I left a HIPAA compliant voicemail for the patient with my call back number.  I will reach out to patient on 11/24/14 if the patient does not return my call today.    Elisabeth Most, Pharm.D. Pharmacy Resident [place]

## 2014-11-24 ENCOUNTER — Other Ambulatory Visit: Payer: Self-pay | Admitting: Pharmacist

## 2014-11-24 DIAGNOSIS — D638 Anemia in other chronic diseases classified elsewhere: Secondary | ICD-10-CM | POA: Diagnosis not present

## 2014-11-24 DIAGNOSIS — J189 Pneumonia, unspecified organism: Secondary | ICD-10-CM | POA: Diagnosis not present

## 2014-11-24 DIAGNOSIS — Z9981 Dependence on supplemental oxygen: Secondary | ICD-10-CM | POA: Diagnosis not present

## 2014-11-24 DIAGNOSIS — E1122 Type 2 diabetes mellitus with diabetic chronic kidney disease: Secondary | ICD-10-CM | POA: Diagnosis not present

## 2014-11-24 DIAGNOSIS — N189 Chronic kidney disease, unspecified: Secondary | ICD-10-CM | POA: Diagnosis not present

## 2014-11-24 DIAGNOSIS — Z8673 Personal history of transient ischemic attack (TIA), and cerebral infarction without residual deficits: Secondary | ICD-10-CM | POA: Diagnosis not present

## 2014-11-24 DIAGNOSIS — I5031 Acute diastolic (congestive) heart failure: Secondary | ICD-10-CM | POA: Diagnosis not present

## 2014-11-24 DIAGNOSIS — I129 Hypertensive chronic kidney disease with stage 1 through stage 4 chronic kidney disease, or unspecified chronic kidney disease: Secondary | ICD-10-CM | POA: Diagnosis not present

## 2014-11-24 DIAGNOSIS — Z794 Long term (current) use of insulin: Secondary | ICD-10-CM | POA: Diagnosis not present

## 2014-11-24 NOTE — Patient Outreach (Signed)
[facility]) Care Management  [place]   9/38/1017  Kyle Stansell Gilvin [birth year] [id number]  Subjective: Felicia Vincent is a 69 yo referred to [place] from [name], RN with [facility].  I made my second outreach call to Felicia Vincent.  I offered to review Felicia Vincent's medications with her or schedule a phone visit to dicuss her medications, but patient reports that she is not familiar with the medications she is taking.  She said her daughter, Felicia Vincent manages all of her medications for her.  Patient states she does not have any questions or concerns regarding her medications at this time.  Patient gave me permission to call her daughter to review her medications.  I called the patient's daughter to review Felicia Vincent's medications, but I was unable to reach the daughter.  I left a HIPAA compliant voice message with my telephone number.    Objective:   Current Medications: Current Outpatient Prescriptions  Medication Sig Dispense Refill  . albuterol (PROVENTIL HFA;VENTOLIN HFA) 108 (90 BASE) MCG/ACT inhaler Inhale 2 puffs into the lungs every 6 (six) hours as needed for wheezing or shortness of breath. 1 Inhaler 2  . amLODipine (NORVASC) 5 MG tablet Take 5 mg by mouth at bedtime.    Marland Kitchen aspirin EC 81 MG tablet Take 81 mg by mouth daily.    Marland Kitchen atorvastatin (LIPITOR) 10 MG tablet Take 10 mg by mouth at bedtime.    . benzonatate (TESSALON) 200 MG capsule Take 200 mg by mouth 3 (three) times daily as needed for cough.    . budesonide (PULMICORT) 0.25 MG/2ML nebulizer solution Take 2 mLs (0.25 mg total) by nebulization 2 (two) times daily. 60 mL 0  . citalopram (CELEXA) 40 MG tablet Take 40 mg by mouth every evening.    . clopidogrel (PLAVIX) 75 MG tablet Take 75 mg by mouth at bedtime.     . donepezil (ARICEPT) 5 MG tablet Take 5 mg by mouth daily.     . furosemide (LASIX) 20 MG tablet Take 20 mg by mouth daily.    Marland Kitchen glimepiride (AMARYL) 4 MG tablet Take 2 mg by mouth 2  (two) times daily.    Marland Kitchen guaiFENesin (MUCINEX) 600 MG 12 hr tablet Take 1 tablet (600 mg total) by mouth 2 (two) times daily. 20 tablet 0  . guaiFENesin-dextromethorphan (ROBITUSSIN DM) 100-10 MG/5ML syrup Take 10 mLs by mouth every 6 (six) hours as needed for cough. 118 mL 0  . hydrALAZINE (APRESOLINE) 25 MG tablet Take 50 mg by mouth 3 (three) times daily.    . insulin detemir (LEVEMIR) 100 UNIT/ML injection Inject 30-35 Units into the skin every evening.    Marland Kitchen ipratropium-albuterol (DUONEB) 0.5-2.5 (3) MG/3ML SOLN Take 3 mLs by nebulization every 6 (six) hours as needed. 300 mL 0  . isosorbide mononitrate (IMDUR) 60 MG 24 hr tablet Take 90 mg by mouth 2 (two) times daily.    . Liraglutide (VICTOZA) 18 MG/3ML SOPN Inject 1.8 mg into the skin daily.    . ondansetron (ZOFRAN) 4 MG tablet Take 4 mg by mouth 3 (three) times daily as needed for nausea or vomiting.    . polycarbophil (FIBERCON) 625 MG tablet Take 625 mg by mouth daily.    . vitamin B-12 (CYANOCOBALAMIN) 1000 MCG tablet Take 1,000 mcg by mouth daily.     No current facility-administered medications for this visit.    Functional Status: In your present state of health, do you have any  difficulty performing the following activities: 11/07/2014  Hearing? N  Vision? N  Difficulty concentrating or making decisions? N  Walking or climbing stairs? N  Dressing or bathing? N  Doing errands, shopping? N    Fall/Depression Screening: No flowsheet data found.  Assessment: Patient's medications are managed by her daughter, Felicia Vincent, who fills weekly pill boxes for the patient.    Plan: I will reach out to patient's daughter, Felicia Vincent, again next week if she does not return my call today.    Elisabeth Most, Pharm.D. Pharmacy Resident [place] [phone]

## 2014-11-24 NOTE — Progress Notes (Signed)
Entered in error

## 2014-11-27 DIAGNOSIS — E1122 Type 2 diabetes mellitus with diabetic chronic kidney disease: Secondary | ICD-10-CM | POA: Diagnosis not present

## 2014-11-27 DIAGNOSIS — J189 Pneumonia, unspecified organism: Secondary | ICD-10-CM | POA: Diagnosis not present

## 2014-11-27 DIAGNOSIS — Z8673 Personal history of transient ischemic attack (TIA), and cerebral infarction without residual deficits: Secondary | ICD-10-CM | POA: Diagnosis not present

## 2014-11-27 DIAGNOSIS — I5031 Acute diastolic (congestive) heart failure: Secondary | ICD-10-CM | POA: Diagnosis not present

## 2014-11-27 DIAGNOSIS — N189 Chronic kidney disease, unspecified: Secondary | ICD-10-CM | POA: Diagnosis not present

## 2014-11-27 DIAGNOSIS — I129 Hypertensive chronic kidney disease with stage 1 through stage 4 chronic kidney disease, or unspecified chronic kidney disease: Secondary | ICD-10-CM | POA: Diagnosis not present

## 2014-11-27 DIAGNOSIS — D638 Anemia in other chronic diseases classified elsewhere: Secondary | ICD-10-CM | POA: Diagnosis not present

## 2014-11-27 DIAGNOSIS — Z794 Long term (current) use of insulin: Secondary | ICD-10-CM | POA: Diagnosis not present

## 2014-11-27 DIAGNOSIS — Z9981 Dependence on supplemental oxygen: Secondary | ICD-10-CM | POA: Diagnosis not present

## 2014-11-28 DIAGNOSIS — I129 Hypertensive chronic kidney disease with stage 1 through stage 4 chronic kidney disease, or unspecified chronic kidney disease: Secondary | ICD-10-CM | POA: Diagnosis not present

## 2014-11-28 DIAGNOSIS — D631 Anemia in chronic kidney disease: Secondary | ICD-10-CM | POA: Diagnosis not present

## 2014-11-28 DIAGNOSIS — Z794 Long term (current) use of insulin: Secondary | ICD-10-CM | POA: Diagnosis not present

## 2014-11-28 DIAGNOSIS — R531 Weakness: Secondary | ICD-10-CM | POA: Diagnosis not present

## 2014-11-28 DIAGNOSIS — E1122 Type 2 diabetes mellitus with diabetic chronic kidney disease: Secondary | ICD-10-CM | POA: Diagnosis not present

## 2014-11-28 DIAGNOSIS — Z9981 Dependence on supplemental oxygen: Secondary | ICD-10-CM | POA: Diagnosis not present

## 2014-11-28 DIAGNOSIS — D638 Anemia in other chronic diseases classified elsewhere: Secondary | ICD-10-CM | POA: Diagnosis not present

## 2014-11-28 DIAGNOSIS — N189 Chronic kidney disease, unspecified: Secondary | ICD-10-CM | POA: Diagnosis not present

## 2014-11-28 DIAGNOSIS — Z8673 Personal history of transient ischemic attack (TIA), and cerebral infarction without residual deficits: Secondary | ICD-10-CM | POA: Diagnosis not present

## 2014-11-28 DIAGNOSIS — I10 Essential (primary) hypertension: Secondary | ICD-10-CM | POA: Diagnosis not present

## 2014-11-28 DIAGNOSIS — J189 Pneumonia, unspecified organism: Secondary | ICD-10-CM | POA: Diagnosis not present

## 2014-11-28 DIAGNOSIS — I5031 Acute diastolic (congestive) heart failure: Secondary | ICD-10-CM | POA: Diagnosis not present

## 2014-11-30 ENCOUNTER — Ambulatory Visit: Payer: Medicare Other | Admitting: *Deleted

## 2014-11-30 ENCOUNTER — Inpatient Hospital Stay
Admission: EM | Admit: 2014-11-30 | Discharge: 2014-12-06 | DRG: 189 | Disposition: A | Discharge status: Other Acute Inpatient Hospital | Payer: Medicare Other | Attending: Internal Medicine | Admitting: Internal Medicine

## 2014-11-30 ENCOUNTER — Encounter: Payer: Self-pay | Admitting: *Deleted

## 2014-11-30 ENCOUNTER — Other Ambulatory Visit: Payer: Self-pay | Admitting: Pharmacist

## 2014-11-30 ENCOUNTER — Other Ambulatory Visit: Payer: Self-pay

## 2014-11-30 ENCOUNTER — Emergency Department: Payer: Medicare Other

## 2014-11-30 ENCOUNTER — Ambulatory Visit: Payer: Medicare Other | Admitting: Family

## 2014-11-30 ENCOUNTER — Inpatient Hospital Stay: Payer: Medicare Other

## 2014-11-30 DIAGNOSIS — I272 Other secondary pulmonary hypertension: Secondary | ICD-10-CM | POA: Diagnosis present

## 2014-11-30 DIAGNOSIS — Z7982 Long term (current) use of aspirin: Secondary | ICD-10-CM

## 2014-11-30 DIAGNOSIS — R609 Edema, unspecified: Secondary | ICD-10-CM | POA: Diagnosis present

## 2014-11-30 DIAGNOSIS — R0602 Shortness of breath: Secondary | ICD-10-CM

## 2014-11-30 DIAGNOSIS — N185 Chronic kidney disease, stage 5: Secondary | ICD-10-CM | POA: Diagnosis not present

## 2014-11-30 DIAGNOSIS — D649 Anemia, unspecified: Secondary | ICD-10-CM | POA: Diagnosis not present

## 2014-11-30 DIAGNOSIS — J9690 Respiratory failure, unspecified, unspecified whether with hypoxia or hypercapnia: Secondary | ICD-10-CM | POA: Diagnosis present

## 2014-11-30 DIAGNOSIS — J9622 Acute and chronic respiratory failure with hypercapnia: Secondary | ICD-10-CM | POA: Diagnosis not present

## 2014-11-30 DIAGNOSIS — J9621 Acute and chronic respiratory failure with hypoxia: Secondary | ICD-10-CM | POA: Diagnosis not present

## 2014-11-30 DIAGNOSIS — R0600 Dyspnea, unspecified: Secondary | ICD-10-CM

## 2014-11-30 DIAGNOSIS — I509 Heart failure, unspecified: Secondary | ICD-10-CM | POA: Diagnosis not present

## 2014-11-30 DIAGNOSIS — J90 Pleural effusion, not elsewhere classified: Secondary | ICD-10-CM | POA: Diagnosis not present

## 2014-11-30 DIAGNOSIS — Z90710 Acquired absence of both cervix and uterus: Secondary | ICD-10-CM | POA: Diagnosis not present

## 2014-11-30 DIAGNOSIS — N179 Acute kidney failure, unspecified: Secondary | ICD-10-CM | POA: Diagnosis not present

## 2014-11-30 DIAGNOSIS — Z8701 Personal history of pneumonia (recurrent): Secondary | ICD-10-CM

## 2014-11-30 DIAGNOSIS — I10 Essential (primary) hypertension: Secondary | ICD-10-CM | POA: Diagnosis not present

## 2014-11-30 DIAGNOSIS — Z9981 Dependence on supplemental oxygen: Secondary | ICD-10-CM

## 2014-11-30 DIAGNOSIS — R809 Proteinuria, unspecified: Secondary | ICD-10-CM | POA: Diagnosis not present

## 2014-11-30 DIAGNOSIS — Z889 Allergy status to unspecified drugs, medicaments and biological substances status: Secondary | ICD-10-CM | POA: Diagnosis not present

## 2014-11-30 DIAGNOSIS — J984 Other disorders of lung: Secondary | ICD-10-CM | POA: Diagnosis not present

## 2014-11-30 DIAGNOSIS — Z886 Allergy status to analgesic agent status: Secondary | ICD-10-CM

## 2014-11-30 DIAGNOSIS — I071 Rheumatic tricuspid insufficiency: Secondary | ICD-10-CM | POA: Diagnosis present

## 2014-11-30 DIAGNOSIS — R600 Localized edema: Secondary | ICD-10-CM | POA: Diagnosis not present

## 2014-11-30 DIAGNOSIS — I5031 Acute diastolic (congestive) heart failure: Secondary | ICD-10-CM | POA: Diagnosis not present

## 2014-11-30 DIAGNOSIS — Z9049 Acquired absence of other specified parts of digestive tract: Secondary | ICD-10-CM | POA: Diagnosis not present

## 2014-11-30 DIAGNOSIS — L8990 Pressure ulcer of unspecified site, unspecified stage: Secondary | ICD-10-CM | POA: Diagnosis present

## 2014-11-30 DIAGNOSIS — Z8673 Personal history of transient ischemic attack (TIA), and cerebral infarction without residual deficits: Secondary | ICD-10-CM

## 2014-11-30 DIAGNOSIS — R05 Cough: Secondary | ICD-10-CM | POA: Diagnosis not present

## 2014-11-30 DIAGNOSIS — N183 Chronic kidney disease, stage 3 (moderate): Secondary | ICD-10-CM | POA: Diagnosis not present

## 2014-11-30 DIAGNOSIS — Z833 Family history of diabetes mellitus: Secondary | ICD-10-CM

## 2014-11-30 DIAGNOSIS — I132 Hypertensive heart and chronic kidney disease with heart failure and with stage 5 chronic kidney disease, or end stage renal disease: Secondary | ICD-10-CM | POA: Diagnosis present

## 2014-11-30 DIAGNOSIS — J9600 Acute respiratory failure, unspecified whether with hypoxia or hypercapnia: Secondary | ICD-10-CM | POA: Diagnosis not present

## 2014-11-30 DIAGNOSIS — E1122 Type 2 diabetes mellitus with diabetic chronic kidney disease: Secondary | ICD-10-CM | POA: Diagnosis present

## 2014-11-30 DIAGNOSIS — R918 Other nonspecific abnormal finding of lung field: Secondary | ICD-10-CM | POA: Diagnosis not present

## 2014-11-30 DIAGNOSIS — E11319 Type 2 diabetes mellitus with unspecified diabetic retinopathy without macular edema: Secondary | ICD-10-CM | POA: Diagnosis present

## 2014-11-30 DIAGNOSIS — J9620 Acute and chronic respiratory failure, unspecified whether with hypoxia or hypercapnia: Secondary | ICD-10-CM | POA: Diagnosis not present

## 2014-11-30 DIAGNOSIS — N189 Chronic kidney disease, unspecified: Secondary | ICD-10-CM | POA: Diagnosis not present

## 2014-11-30 DIAGNOSIS — J811 Chronic pulmonary edema: Secondary | ICD-10-CM

## 2014-11-30 DIAGNOSIS — N184 Chronic kidney disease, stage 4 (severe): Secondary | ICD-10-CM | POA: Diagnosis not present

## 2014-11-30 DIAGNOSIS — I5023 Acute on chronic systolic (congestive) heart failure: Secondary | ICD-10-CM | POA: Diagnosis not present

## 2014-11-30 LAB — BRAIN NATRIURETIC PEPTIDE: B NATRIURETIC PEPTIDE 5: 342 pg/mL — AB (ref 0.0–100.0)

## 2014-11-30 LAB — COMPREHENSIVE METABOLIC PANEL
ALK PHOS: 81 U/L (ref 38–126)
ALT: 12 U/L — AB (ref 14–54)
AST: 12 U/L — ABNORMAL LOW (ref 15–41)
Albumin: 2.4 g/dL — ABNORMAL LOW (ref 3.5–5.0)
Anion gap: 9 (ref 5–15)
BILIRUBIN TOTAL: 0.6 mg/dL (ref 0.3–1.2)
BUN: 37 mg/dL — ABNORMAL HIGH (ref 6–20)
CALCIUM: 8.7 mg/dL — AB (ref 8.9–10.3)
CO2: 29 mmol/L (ref 22–32)
CREATININE: 3.18 mg/dL — AB (ref 0.44–1.00)
Chloride: 96 mmol/L — ABNORMAL LOW (ref 101–111)
GFR, EST AFRICAN AMERICAN: 16 mL/min — AB (ref >60)
GFR, EST NON AFRICAN AMERICAN: 14 mL/min — AB (ref >60)
Glucose, Bld: 245 mg/dL — ABNORMAL HIGH (ref 65–99)
Potassium: 5 mmol/L (ref 3.5–5.1)
SODIUM: 134 mmol/L — AB (ref 135–145)
Total Protein: 6.9 g/dL (ref 6.5–8.1)

## 2014-11-30 LAB — CBC
HEMATOCRIT: 24.2 % — AB (ref 35.0–47.0)
HEMOGLOBIN: 8.2 g/dL — AB (ref 12.0–16.0)
MCH: 30.6 pg (ref 26.0–34.0)
MCHC: 33.9 g/dL (ref 32.0–36.0)
MCV: 90.4 fL (ref 80.0–100.0)
Platelets: 160 10*3/uL (ref 150–440)
RBC: 2.68 MIL/uL — AB (ref 3.80–5.20)
RDW: 16.3 % — ABNORMAL HIGH (ref 11.5–14.5)
WBC: 7.2 10*3/uL (ref 3.6–11.0)

## 2014-11-30 LAB — TROPONIN I: Troponin I: <0.03 ng/mL (ref <0.031)

## 2014-11-30 LAB — GLUCOSE, CAPILLARY: Glucose-Capillary: 152 mg/dL — ABNORMAL HIGH (ref 65–99)

## 2014-11-30 MED ORDER — GUAIFENESIN ER 600 MG PO TB12
600.0000 mg | ORAL_TABLET | Freq: Two times a day (BID) | ORAL | Status: DC
  Administered 2014-11-30 – 2014-12-04 (×9): 600 mg via ORAL
  Filled 2014-11-30 (×9): qty 1

## 2014-11-30 MED ORDER — HYDRALAZINE HCL 50 MG PO TABS
50.0000 mg | ORAL_TABLET | Freq: Three times a day (TID) | ORAL | Status: DC
  Administered 2014-11-30 – 2014-12-05 (×16): 50 mg via ORAL
  Filled 2014-11-30 (×17): qty 1

## 2014-11-30 MED ORDER — VITAMIN B-12 1000 MCG PO TABS
1000.0000 ug | ORAL_TABLET | Freq: Every day | ORAL | Status: DC
  Administered 2014-12-01 – 2014-12-05 (×5): 1000 ug via ORAL
  Filled 2014-11-30 (×5): qty 1

## 2014-11-30 MED ORDER — ASPIRIN EC 81 MG PO TBEC
81.0000 mg | DELAYED_RELEASE_TABLET | Freq: Every day | ORAL | Status: DC
  Administered 2014-12-01 – 2014-12-05 (×5): 81 mg via ORAL
  Filled 2014-11-30 (×5): qty 1

## 2014-11-30 MED ORDER — ONDANSETRON HCL 4 MG PO TABS
4.0000 mg | ORAL_TABLET | Freq: Three times a day (TID) | ORAL | Status: DC | PRN
  Administered 2014-12-04: 4 mg via ORAL
  Filled 2014-11-30: qty 1

## 2014-11-30 MED ORDER — FUROSEMIDE 10 MG/ML IJ SOLN
2.0000 mg/h | INTRAVENOUS | Status: DC
  Administered 2014-11-30: 2 mg/h via INTRAVENOUS
  Filled 2014-11-30: qty 25

## 2014-11-30 MED ORDER — GLIMEPIRIDE 2 MG PO TABS
2.0000 mg | ORAL_TABLET | Freq: Two times a day (BID) | ORAL | Status: DC
  Administered 2014-11-30 – 2014-12-04 (×8): 2 mg via ORAL
  Filled 2014-11-30 (×8): qty 1

## 2014-11-30 MED ORDER — ATORVASTATIN CALCIUM 10 MG PO TABS
10.0000 mg | ORAL_TABLET | Freq: Every day | ORAL | Status: DC
  Administered 2014-11-30 – 2014-12-05 (×6): 10 mg via ORAL
  Filled 2014-11-30 (×6): qty 1

## 2014-11-30 MED ORDER — AMLODIPINE BESYLATE 5 MG PO TABS
5.0000 mg | ORAL_TABLET | Freq: Every day | ORAL | Status: DC
  Administered 2014-11-30 – 2014-12-05 (×6): 5 mg via ORAL
  Filled 2014-11-30 (×6): qty 1

## 2014-11-30 MED ORDER — BENZONATATE 100 MG PO CAPS
200.0000 mg | ORAL_CAPSULE | Freq: Three times a day (TID) | ORAL | Status: DC | PRN
  Administered 2014-12-01 – 2014-12-04 (×2): 200 mg via ORAL
  Filled 2014-11-30 (×2): qty 2

## 2014-11-30 MED ORDER — CALCIUM POLYCARBOPHIL 625 MG PO TABS
625.0000 mg | ORAL_TABLET | Freq: Every day | ORAL | Status: DC
  Administered 2014-12-01 – 2014-12-05 (×5): 625 mg via ORAL
  Filled 2014-11-30 (×7): qty 1

## 2014-11-30 MED ORDER — HEPARIN SODIUM (PORCINE) 5000 UNIT/ML IJ SOLN
5000.0000 [IU] | Freq: Three times a day (TID) | INTRAMUSCULAR | Status: DC
  Administered 2014-11-30 – 2014-12-05 (×13): 5000 [IU] via SUBCUTANEOUS
  Filled 2014-11-30 (×13): qty 1

## 2014-11-30 MED ORDER — IPRATROPIUM-ALBUTEROL 0.5-2.5 (3) MG/3ML IN SOLN
3.0000 mL | Freq: Four times a day (QID) | RESPIRATORY_TRACT | Status: DC | PRN
  Administered 2014-11-30 – 2014-12-01 (×2): 3 mL via RESPIRATORY_TRACT
  Filled 2014-11-30 (×2): qty 3

## 2014-11-30 MED ORDER — LIRAGLUTIDE 18 MG/3ML ~~LOC~~ SOPN
1.8000 mg | PEN_INJECTOR | Freq: Every day | SUBCUTANEOUS | Status: DC
  Administered 2014-12-01 – 2014-12-05 (×5): 1.8 mg via SUBCUTANEOUS
  Filled 2014-11-30: qty 3

## 2014-11-30 MED ORDER — FUROSEMIDE 20 MG PO TABS: 20.0000 mg | ORAL_TABLET | Freq: Every day | ORAL | Status: DC

## 2014-11-30 MED ORDER — ISOSORBIDE MONONITRATE ER 30 MG PO TB24
90.0000 mg | ORAL_TABLET | Freq: Two times a day (BID) | ORAL | Status: DC
  Administered 2014-11-30 – 2014-12-05 (×10): 90 mg via ORAL
  Filled 2014-11-30 (×11): qty 1

## 2014-11-30 MED ORDER — DONEPEZIL HCL 5 MG PO TABS
5.0000 mg | ORAL_TABLET | Freq: Every day | ORAL | Status: DC
  Administered 2014-11-30 – 2014-12-05 (×6): 5 mg via ORAL
  Filled 2014-11-30 (×6): qty 1

## 2014-11-30 MED ORDER — SODIUM CHLORIDE 0.9 % IJ SOLN
3.0000 mL | Freq: Two times a day (BID) | INTRAMUSCULAR | Status: DC
  Administered 2014-11-30 – 2014-12-03 (×6): 3 mL via INTRAVENOUS
  Administered 2014-12-04: 21:00:00 via INTRAVENOUS
  Administered 2014-12-04 – 2014-12-05 (×2): 3 mL via INTRAVENOUS

## 2014-11-30 MED ORDER — INSULIN DETEMIR 100 UNIT/ML ~~LOC~~ SOLN
25.0000 [IU] | Freq: Every day | SUBCUTANEOUS | Status: DC
  Administered 2014-11-30: 25 [IU] via SUBCUTANEOUS
  Filled 2014-11-30 (×2): qty 0.25

## 2014-11-30 MED ORDER — CLOPIDOGREL BISULFATE 75 MG PO TABS
75.0000 mg | ORAL_TABLET | Freq: Every day | ORAL | Status: DC
  Administered 2014-11-30 – 2014-12-05 (×6): 75 mg via ORAL
  Filled 2014-11-30 (×6): qty 1

## 2014-11-30 MED ORDER — CITALOPRAM HYDROBROMIDE 20 MG PO TABS
40.0000 mg | ORAL_TABLET | Freq: Every evening | ORAL | Status: DC
  Administered 2014-11-30 – 2014-12-05 (×6): 40 mg via ORAL
  Filled 2014-11-30 (×6): qty 2

## 2014-11-30 MED ORDER — BUDESONIDE 0.25 MG/2ML IN SUSP
0.2500 mg | Freq: Two times a day (BID) | RESPIRATORY_TRACT | Status: DC
  Administered 2014-11-30 – 2014-12-05 (×10): 0.25 mg via RESPIRATORY_TRACT
  Filled 2014-11-30 (×10): qty 2

## 2014-11-30 MED ORDER — GUAIFENESIN-DM 100-10 MG/5ML PO SYRP: 10.0000 mL | ORAL_SOLUTION | Freq: Four times a day (QID) | ORAL | Status: DC | PRN

## 2014-11-30 NOTE — ED Notes (Signed)
Pt to triage via wheelchair.  Pt is on 4liters of oxygen.  Pt recently had pneumonia, was discharged from [facility] 1 week ago.  No chest pain.  Pt has a dry cough.  No fever or chills.  Pt alert.  Pt states sx worse yesterday.

## 2014-11-30 NOTE — ED Notes (Signed)
Assisted Dr. Burlene Arnt for hemoccult.  Hemoccult Negative.  Control Acceptable.

## 2014-11-30 NOTE — ED Provider Notes (Signed)
[facility] Emergency Department Provider Note  ____________________________________________   I have reviewed the triage vital signs and the nursing notes.   HISTORY  Chief Complaint Shortness of Breath    HPI Felicia Vincent is a 69 y.o. female unaccompanied and recent medical history including recent respiratory failure in the context of pneumonia, sent home on oxygen, saw her doctor and was given a Z-Pak which she continues to take. Patient is on 2 L home oxygen amount partially last 2 days has had increase her oxygen to 4 L, she desats into the 70s when she tries to walk around. She has had an occasional cough. No fevers. No melena no bright red blood per rectum. No chest pain, does feel generally weak. Family is concerned because of the increased oxygen requirement that the patient has.  Past Medical History  Diagnosis Date  . Hypertension   . Diabetes mellitus without complication ([place])   . TIA (transient ischemic attack)   . Cataract   . Stroke ([place])   . Dizziness   . Anemia   . Pneumonia     Patient Active Problem List   Diagnosis Date Noted  . Acute diastolic congestive heart failure ([place]) 11/17/2014  . Anemia due to chronic kidney disease 11/14/2014  . Pneumonia 11/07/2014  . Acute respiratory failure ([place]) 11/07/2014  . Anemia 07/10/2014    Past Surgical History  Procedure Laterality Date  . Cholecystectomy    . Lithotripsy    . Abdominal hysterectomy    . Breast surgery      Current Outpatient Rx  Name  Route  Sig  Dispense  Refill  . albuterol (PROVENTIL HFA;VENTOLIN HFA) 108 (90 BASE) MCG/ACT inhaler   Inhalation   Inhale 2 puffs into the lungs every 6 (six) hours as needed for wheezing or shortness of breath.   1 Inhaler   2   . amLODipine (NORVASC) 5 MG tablet   Oral   Take 5 mg by mouth at bedtime.         Marland Kitchen aspirin EC 81 MG tablet   Oral   Take 81 mg by mouth daily.         Marland Kitchen atorvastatin (LIPITOR) 10 MG  tablet   Oral   Take 10 mg by mouth at bedtime.         . benzonatate (TESSALON) 200 MG capsule   Oral   Take 200 mg by mouth 3 (three) times daily as needed for cough.         . budesonide (PULMICORT) 0.25 MG/2ML nebulizer solution   Nebulization   Take 2 mLs (0.25 mg total) by nebulization 2 (two) times daily.   60 mL   0   . citalopram (CELEXA) 40 MG tablet   Oral   Take 40 mg by mouth every evening.         . clopidogrel (PLAVIX) 75 MG tablet   Oral   Take 75 mg by mouth at bedtime.          . donepezil (ARICEPT) 5 MG tablet   Oral   Take 5 mg by mouth daily.          . furosemide (LASIX) 20 MG tablet   Oral   Take 20 mg by mouth daily.         Marland Kitchen glimepiride (AMARYL) 4 MG tablet   Oral   Take 2 mg by mouth 2 (two) times daily.         Marland Kitchen  guaiFENesin (MUCINEX) 600 MG 12 hr tablet   Oral   Take 1 tablet (600 mg total) by mouth 2 (two) times daily.   20 tablet   0   . guaiFENesin-dextromethorphan (ROBITUSSIN DM) 100-10 MG/5ML syrup   Oral   Take 10 mLs by mouth every 6 (six) hours as needed for cough.   118 mL   0   . hydrALAZINE (APRESOLINE) 25 MG tablet   Oral   Take 50 mg by mouth 3 (three) times daily.         . insulin detemir (LEVEMIR) 100 UNIT/ML injection   Subcutaneous   Inject 30-35 Units into the skin every evening.         Marland Kitchen ipratropium-albuterol (DUONEB) 0.5-2.5 (3) MG/3ML SOLN   Nebulization   Take 3 mLs by nebulization every 6 (six) hours as needed.   300 mL   0   . isosorbide mononitrate (IMDUR) 60 MG 24 hr tablet   Oral   Take 90 mg by mouth 2 (two) times daily.         . Liraglutide (VICTOZA) 18 MG/3ML SOPN   Subcutaneous   Inject 1.8 mg into the skin daily.         . ondansetron (ZOFRAN) 4 MG tablet   Oral   Take 4 mg by mouth 3 (three) times daily as needed for nausea or vomiting.         . polycarbophil (FIBERCON) 625 MG tablet   Oral   Take 625 mg by mouth daily.         . vitamin B-12  (CYANOCOBALAMIN) 1000 MCG tablet   Oral   Take 1,000 mcg by mouth daily.           Allergies Codeine and Prednisone  No family history on file.  Social History Social History  Substance Use Topics  . Smoking status: Never Smoker   . Smokeless tobacco: Never Used  . Alcohol Use: No    Review of Systems Constitutional: No fever/chills Eyes: No visual changes. ENT: No sore throat. No stiff neck no neck pain Cardiovascular: Denies chest pain. Respiratory: shortness of breath. Gastrointestinal:   no vomiting.  No diarrhea.  No constipation. Genitourinary: Negative for dysuria. Musculoskeletal: Negative lower extremity swelling Skin: Negative for rash. Neurological: Negative for headaches, focal weakness or numbness. 10-point ROS otherwise negative.  ____________________________________________   PHYSICAL EXAM:  VITAL SIGNS: ED Triage Vitals  Enc Vitals Group     BP 11/30/14 1640 174/65 mmHg     Pulse Rate 11/30/14 1640 77     Resp 11/30/14 1640 24     Temp 11/30/14 1640 98.8 F (37.1 C)     Temp Source 11/30/14 1640 Oral     SpO2 11/30/14 1640 94 %     Weight 11/30/14 1640 190 lb (86.183 kg)     Height 11/30/14 1640 5\' 1"  (1.549 m)     Head Cir --      Peak Flow --      Pain Score --      Pain Loc --      Pain Edu? --      Excl. in [place]? --     Constitutional: Alert and oriented. In no acute medical distress Eyes: Conjunctivae are normal. PERRL. EOMI. Head: Atraumatic. Nose: No congestion/rhinnorhea. Mouth/Throat: Mucous membranes are moist.  Oropharynx non-erythematous. Neck: No stridor.   Nontender with no meningismus Cardiovascular: Normal rate, regular rhythm. Grossly normal heart sounds.  Good peripheral circulation. Respiratory:  Normal respiratory effort.  No retractions. Diffuse coarse breath sounds noted no wheezes no rales or rhonchi Gastrointestinal: Soft and nontender. No distention. No guarding no rebound Rectal exam: Guaiac negative brown  stool nurse chaperone present Back:  There is no focal tenderness or step off there is no midline tenderness there are no lesions noted. there is no CVA tenderness Musculoskeletal: No lower extremity tenderness. No joint effusions, no DVT signs strong distal pulses no edema Neurologic:  Normal speech and language. No gross focal neurologic deficits are appreciated.  Skin:  Skin is warm, dry and intact. No rash noted. Psychiatric: Mood and affect are normal. Speech and behavior are normal.  ____________________________________________   LABS (all labs ordered are listed, but only abnormal results are displayed)  Labs Reviewed  CBC - Abnormal; Notable for the following:    RBC 2.68 (*)    Hemoglobin 8.2 (*)    HCT 24.2 (*)    RDW 16.3 (*)    All other components within normal limits  COMPREHENSIVE METABOLIC PANEL - Abnormal; Notable for the following:    Sodium 134 (*)    Chloride 96 (*)    Glucose, Bld 245 (*)    BUN 37 (*)    Creatinine, Ser 3.18 (*)    Calcium 8.7 (*)    Albumin 2.4 (*)    AST 12 (*)    ALT 12 (*)    GFR calc non Af Amer 14 (*)    GFR calc Af Amer 16 (*)    All other components within normal limits  BRAIN NATRIURETIC PEPTIDE - Abnormal; Notable for the following:    B Natriuretic Peptide 342.0 (*)    All other components within normal limits  CULTURE, BLOOD (ROUTINE X 2)  CULTURE, BLOOD (ROUTINE X 2)  TROPONIN I  TYPE AND SCREEN   ____________________________________________  EKG Normal sinus rhythm rate 76 bpm no acute ST elevation or depression. I personally reviewed this ____________________________________________  RADIOLOGY  Reviewed personally ____________________________________________   PROCEDURES  Procedure(s) performed: None  Critical Care performed: None  ____________________________________________   INITIAL IMPRESSION / ASSESSMENT AND PLAN / ED COURSE  Pertinent labs & imaging results that were available during my care of  the patient were reviewed by me and considered in my medical decision making (see chart for details).  Patient with anemia, and diffuse nonspecific lung injury after a pneumonia with increasing hypoxia and oxygen demand at home. Unable to ambulate secondary to dyspnea. We will discuss with the hospitalist service. No obvious evidence of pneumonia no fever no white count, already on antibiotics. Blood cultures have been obtained. No obvious evidence of CHF, her BNP is not markedly elevated. This certainly could be a progression of her disease I will hold off antibiotics were discussed with the admitting doctors ____________________________________________   FINAL CLINICAL IMPRESSION(S) / ED DIAGNOSES  Final diagnoses:  None     Schuyler Amor, MD 11/30/14 [phone]

## 2014-11-30 NOTE — H&P (Addendum)
[facility] [place] NAME: [name]    MR#:  [id number]  DATE OF BIRTH:  02-18-1946  DATE OF ADMISSION:  11/30/2014  PRIMARY CARE PHYSICIAN: Lavera Guise, MD   REQUESTING/REFERRING PHYSICIAN: Marcelene Butte  CHIEF COMPLAINT:   Chief Complaint  Patient presents with  . Shortness of Breath    HISTORY OF PRESENT ILLNESS: Felicia Vincent  is a 69 y.o. female with a known history of hypertension, diabetes, stroke, pneumonia, anemia- was admitted to hospital 2 weeks ago and then complicated course because of pneumonia and had intubation also found to have chronic renal failure and diastolic heart failure was discharged home with oxygen 10 days ago. Patient's son and daughter present in the room in emergency room as per them initially for few days she was doing okay but then gradually she again started feeling worse and she is feeling excessive short of breath with walking chest 2-3 steps to her bedside commode. They spoke to her primary care doctor and she gave her azithromycin prescription but she did not had any change in her condition so finally decided to think her to emergency room. As per them are oxygen level was dropping of 75 and 80. On further questioning they denies her having any worsening in her cough or sputum production, no fever.  They agreed that since discharge from hospital she has been sleeping in her recliner only and she does not go to her bed, they also noted some swelling on her legs and she just have minimal activity at home otherwise just is seated in her couch. In ER noted to have hypoxia, but her white cell count is not elevated. Chest x-ray is nonconclusive with some questionable pneumonia was edema.  PAST MEDICAL HISTORY:   Past Medical History  Diagnosis Date  . Hypertension   . Diabetes mellitus without complication ([place])   . TIA (transient ischemic attack)   . Cataract   . Stroke ([place])   . Dizziness   . Anemia   .  Pneumonia     PAST SURGICAL HISTORY:  Past Surgical History  Procedure Laterality Date  . Cholecystectomy    . Lithotripsy    . Abdominal hysterectomy    . Breast surgery      SOCIAL HISTORY:  Social History  Substance Use Topics  . Smoking status: Never Smoker   . Smokeless tobacco: Never Used  . Alcohol Use: No    FAMILY HISTORY:  Family History  Problem Relation Age of Onset  . Diabetes Mother     DRUG ALLERGIES:  Allergies  Allergen Reactions  . Codeine Nausea Only  . Prednisone Other (See Comments)    Pts daughter states that this medication "makes her evil".      REVIEW OF SYSTEMS:   CONSTITUTIONAL: No fever, positive for fatigue or weakness.  EYES: No blurred or double vision.  EARS, NOSE, AND THROAT: No tinnitus or ear pain.  RESPIRATORY: No cough,positive for shortness of breath, wheezing or hemoptysis.  CARDIOVASCULAR: No chest pain, orthopnea, edema.  GASTROINTESTINAL: No nausea, vomiting, diarrhea or abdominal pain.  GENITOURINARY: No dysuria, hematuria.  ENDOCRINE: No polyuria, nocturia,  HEMATOLOGY: No anemia, easy bruising or bleeding SKIN: No rash or lesion. MUSCULOSKELETAL: No joint pain or arthritis.  Leg swelling present. NEUROLOGIC: No tingling, numbness, weakness.  PSYCHIATRY: No anxiety or depression.   MEDICATIONS AT HOME:  Prior to Admission medications   Medication Sig Start Date End Date Taking? Authorizing Provider  amLODipine (NORVASC) 5 MG tablet Take 5 mg by mouth at bedtime.   Yes Historical Provider, MD  aspirin EC 81 MG tablet Take 81 mg by mouth daily.   Yes Historical Provider, MD  atorvastatin (LIPITOR) 10 MG tablet Take 10 mg by mouth at bedtime.   Yes Historical Provider, MD  azithromycin (ZITHROMAX) 250 MG tablet Take by mouth See admin instructions. 500 mg on day 1, then 1 tablet daily for 9 days 11/23/14  Yes Historical Provider, MD  benzonatate (TESSALON) 200 MG capsule Take 200 mg by mouth 3 (three) times daily as  needed for cough.   Yes Historical Provider, MD  budesonide (PULMICORT) 0.25 MG/2ML nebulizer solution Take 2 mLs (0.25 mg total) by nebulization 2 (two) times daily. 11/17/14  Yes Gladstone Lighter, MD  citalopram (CELEXA) 40 MG tablet Take 40 mg by mouth every evening.   Yes Historical Provider, MD  clopidogrel (PLAVIX) 75 MG tablet Take 75 mg by mouth at bedtime.    Yes Historical Provider, MD  donepezil (ARICEPT) 5 MG tablet Take 5 mg by mouth daily.    Yes Historical Provider, MD  furosemide (LASIX) 20 MG tablet Take 20 mg by mouth daily.   Yes Historical Provider, MD  glimepiride (AMARYL) 4 MG tablet Take 2 mg by mouth 2 (two) times daily.   Yes Historical Provider, MD  hydrALAZINE (APRESOLINE) 25 MG tablet Take 50 mg by mouth 3 (three) times daily.   Yes Historical Provider, MD  insulin detemir (LEVEMIR) 100 UNIT/ML injection Inject 30-35 Units into the skin every evening.   Yes Historical Provider, MD  ipratropium-albuterol (DUONEB) 0.5-2.5 (3) MG/3ML SOLN Take 3 mLs by nebulization every 6 (six) hours as needed. Patient taking differently: Take 3 mLs by nebulization every 6 (six) hours as needed (shortness of breath).  11/17/14  Yes Gladstone Lighter, MD  isosorbide mononitrate (IMDUR) 60 MG 24 hr tablet Take 90 mg by mouth 2 (two) times daily.   Yes Historical Provider, MD  Liraglutide (VICTOZA) 18 MG/3ML SOPN Inject 1.8 mg into the skin daily.   Yes Historical Provider, MD  ondansetron (ZOFRAN) 4 MG tablet Take 4 mg by mouth 3 (three) times daily as needed for nausea or vomiting.   Yes Historical Provider, MD  polycarbophil (FIBERCON) 625 MG tablet Take 625 mg by mouth daily.   Yes Historical Provider, MD  vitamin B-12 (CYANOCOBALAMIN) 1000 MCG tablet Take 1,000 mcg by mouth daily.   Yes Historical Provider, MD  albuterol (PROVENTIL HFA;VENTOLIN HFA) 108 (90 BASE) MCG/ACT inhaler Inhale 2 puffs into the lungs every 6 (six) hours as needed for wheezing or shortness of breath. Patient not  taking: Reported on 11/30/2014 11/01/14   Gregor Hams, MD  guaiFENesin (MUCINEX) 600 MG 12 hr tablet Take 1 tablet (600 mg total) by mouth 2 (two) times daily. Patient not taking: Reported on 11/30/2014 11/17/14   Gladstone Lighter, MD  guaiFENesin-dextromethorphan (ROBITUSSIN DM) 100-10 MG/5ML syrup Take 10 mLs by mouth every 6 (six) hours as needed for cough. Patient not taking: Reported on 11/30/2014 11/17/14   Gladstone Lighter, MD      PHYSICAL EXAMINATION:   VITAL SIGNS: Blood pressure 174/67, pulse 75, temperature 98.2 F (36.8 C), temperature source Oral, resp. rate 24, height 5\' 1"  (1.549 m), weight 86.183 kg (190 lb), SpO2 99 %.  GENERAL:  69 y.o.-year-old patient lying in the bed with no acute distress.  EYES: Pupils equal, round, reactive to light and accommodation. No scleral icterus. Extraocular muscles intact.  HEENT: Head atraumatic, normocephalic. Oropharynx and nasopharynx clear.  NECK:  Supple, no jugular venous distention. No thyroid enlargement, no tenderness.  LUNGS: Normal breath sounds bilaterally, no wheezing, coarse crepitation present . No use of accessory muscles of respiration.  CARDIOVASCULAR: S1, S2 normal. No murmurs, rubs, or gallops.  ABDOMEN: Soft, nontender, nondistended. Bowel sounds present. No organomegaly or mass.  EXTREMITIES: Mild pedal edema, no cyanosis, or clubbing.  NEUROLOGIC: Cranial nerves II through XII are intact. Muscle strength 5/5 in all extremities. Sensation intact. Gait not checked.  PSYCHIATRIC: The patient is alert and oriented x 3.  SKIN: No obvious rash, lesion, or ulcer.   LABORATORY PANEL:   CBC  Recent Labs Lab 11/30/14 1655  WBC 7.2  HGB 8.2*  HCT 24.2*  PLT 160  MCV 90.4  MCH 30.6  MCHC 33.9  RDW 16.3*   ------------------------------------------------------------------------------------------------------------------  Chemistries   Recent Labs Lab 11/30/14 1655  NA 134*  K 5.0  CL 96*  CO2 29  GLUCOSE  245*  BUN 37*  CREATININE 3.18*  CALCIUM 8.7*  AST 12*  ALT 12*  ALKPHOS 81  BILITOT 0.6   ------------------------------------------------------------------------------------------------------------------ estimated creatinine clearance is 16.2 mL/min (by C-G formula based on Cr of 3.18). ------------------------------------------------------------------------------------------------------------------ No results for input(s): TSH, T4TOTAL, T3FREE, THYROIDAB in the last 72 hours.  Invalid input(s): FREET3   Coagulation profile No results for input(s): INR, PROTIME in the last 168 hours. ------------------------------------------------------------------------------------------------------------------- No results for input(s): DDIMER in the last 72 hours. -------------------------------------------------------------------------------------------------------------------  Cardiac Enzymes  Recent Labs Lab 11/30/14 1655  TROPONINI <0.03   ------------------------------------------------------------------------------------------------------------------ Invalid input(s): POCBNP  ---------------------------------------------------------------------------------------------------------------  Urinalysis    Component Value Date/Time   COLORURINE STRAW* 07/08/2014 [place] 05/23/2013 0352   APPEARANCEUR CLEAR* 07/08/2014 1355   APPEARANCEUR Clear 05/23/2013 0352   LABSPEC 1.011 07/08/2014 1355   LABSPEC 1.018 05/23/2013 0352   PHURINE 5.0 07/08/2014 1355   PHURINE 7.0 05/23/2013 0352   GLUCOSEU 150* 07/08/2014 1355   GLUCOSEU >=500 05/23/2013 0352   HGBUR NEGATIVE 07/08/2014 1355   HGBUR Negative 05/23/2013 [place] 07/08/2014 1355   BILIRUBINUR Negative 05/23/2013 [place] 07/08/2014 1355   KETONESUR Negative 05/23/2013 0352   PROTEINUR >500* 07/08/2014 1355   PROTEINUR 100 mg/dL 05/23/2013 0352   NITRITE NEGATIVE  07/08/2014 1355   NITRITE Negative 05/23/2013 0352   LEUKOCYTESUR NEGATIVE 07/08/2014 1355   LEUKOCYTESUR Negative 05/23/2013 0352     RADIOLOGY: Dg Chest 2 View  11/30/2014   CLINICAL DATA:  Short of breath  EXAM: CHEST  2 VIEW  COMPARISON:  11/16/2014  FINDINGS: Lungs are under aerated. Heterogeneous opacities throughout both lungs are worrisome for patchy bilateral extensive airspace disease. Heart remains moderately enlarged. No pneumothorax.  IMPRESSION: Extensive bilateral airspace disease.   Electronically Signed   By: Marybelle Killings M.D.   On: 11/30/2014 17:32    IMPRESSION AND PLAN:  * Acute on chronic respiratory failure with hypoxia  At baseline 2 L oxygen nasal cannula at home.  Most likely this is secondary to CHF exacerbation  Her white cell count is not elevated, she did not had any sputum production or fever, so I will not treat her with antibiotic.  Requested CT scan of the chest without contrast to confirm this finding as she has renal failure I would not like to give Lasix at this point.   IF CT scan is noncontributory then I would definitely like to have Doppler studies for lower  extremities as she is very minimal functionally active and there are high chances of getting DVTs.  * Acute diastolic congestive heart failure  We have echocardiogram done in last admission.  A CT scan confirms pulmonary edema then she might require Lasix IV drip in presence of chronic renal failure to prevent further damage to kidneys.  I will monitor on telemetry, she was seen by Dr. Clayborn Bigness in last admission so if needed I'll call for consult.  * Recent pneumonia  Currently there is no need for antibiotics so I will not start it.  * Hypertension  Continue amlodipine and hydralazine furosemide.  * Diabetes  Continue glimepiride and Levemir, on insulin sliding scale coverage.  * Chronic renal failure  Appears stable, will continue monitoring.  All the records are reviewed and case  discussed with ED provider. Management plans discussed with the patient, family and they are in agreement.  CODE STATUS: full  TOTAL TIME TAKING CARE OF THIS PATIENT: 50 minutes.    Vaughan Basta M.D on 11/30/2014   Between 7am to 6pm - Pager - [phone]  After 6pm go to [email or web address] - password [email or web address] [facility]  Office  [phone]  CC: Primary care physician; Lavera Guise, MD   Note: This dictation was prepared with Dragon dictation along with smaller phrase technology. Any transcriptional errors that result from this process are unintentional.

## 2014-11-30 NOTE — Patient Outreach (Signed)
[facility]) Care Management  00/08/1217  Felicia Vincent [birth year] [id number]   [name] is a 69 yo referred to [facility] [place] from [name], [place] with [facility]. I made my second outreach call to [name]'s daughter Felicia Vincent who manages the patient's medications and fills weekly pill boxes for her mother.  Patient has gaven me permission to call her daughter to review her medications. I was unable to reach the daughter. I left a HIPAA compliant voice message with my telephone number. I will reach out to patient's daughter, Felicia Vincent, again next week if she does not return my call today.    Elisabeth Most, Pharm.D. Pharmacy Resident [place] [phone]

## 2014-12-01 ENCOUNTER — Encounter: Payer: Self-pay | Admitting: *Deleted

## 2014-12-01 ENCOUNTER — Inpatient Hospital Stay: Payer: Medicare Other

## 2014-12-01 DIAGNOSIS — J9622 Acute and chronic respiratory failure with hypercapnia: Principal | ICD-10-CM

## 2014-12-01 DIAGNOSIS — I5031 Acute diastolic (congestive) heart failure: Secondary | ICD-10-CM

## 2014-12-01 DIAGNOSIS — L8990 Pressure ulcer of unspecified site, unspecified stage: Secondary | ICD-10-CM | POA: Diagnosis present

## 2014-12-01 DIAGNOSIS — J9621 Acute and chronic respiratory failure with hypoxia: Secondary | ICD-10-CM

## 2014-12-01 LAB — BASIC METABOLIC PANEL
ANION GAP: 6 (ref 5–15)
BUN: 36 mg/dL — ABNORMAL HIGH (ref 6–20)
CALCIUM: 8.6 mg/dL — AB (ref 8.9–10.3)
CO2: 28 mmol/L (ref 22–32)
Chloride: 103 mmol/L (ref 101–111)
Creatinine, Ser: 2.94 mg/dL — ABNORMAL HIGH (ref 0.44–1.00)
GFR, EST AFRICAN AMERICAN: 17 mL/min — AB (ref >60)
GFR, EST NON AFRICAN AMERICAN: 15 mL/min — AB (ref >60)
GLUCOSE: 61 mg/dL — AB (ref 65–99)
Potassium: 4.6 mmol/L (ref 3.5–5.1)
SODIUM: 137 mmol/L (ref 135–145)

## 2014-12-01 LAB — CBC
HCT: 22.7 % — ABNORMAL LOW (ref 35.0–47.0)
HEMOGLOBIN: 7.6 g/dL — AB (ref 12.0–16.0)
MCH: 28.3 pg (ref 26.0–34.0)
MCHC: 33.4 g/dL (ref 32.0–36.0)
MCV: 84.8 fL (ref 80.0–100.0)
Platelets: 147 10*3/uL — ABNORMAL LOW (ref 150–440)
RBC: 2.68 MIL/uL — AB (ref 3.80–5.20)
RDW: 16.4 % — ABNORMAL HIGH (ref 11.5–14.5)
WBC: 8.2 10*3/uL (ref 3.6–11.0)

## 2014-12-01 LAB — GLUCOSE, CAPILLARY
GLUCOSE-CAPILLARY: 317 mg/dL — AB (ref 65–99)
GLUCOSE-CAPILLARY: 332 mg/dL — AB (ref 65–99)
GLUCOSE-CAPILLARY: 177 mg/dL — AB (ref 65–99)
Glucose-Capillary: 167 mg/dL — ABNORMAL HIGH (ref 65–99)

## 2014-12-01 MED ORDER — FUROSEMIDE 10 MG/ML IJ SOLN
80.0000 mg | Freq: Two times a day (BID) | INTRAMUSCULAR | Status: DC
  Administered 2014-12-01 – 2014-12-02 (×4): 80 mg via INTRAVENOUS
  Filled 2014-12-01 (×5): qty 8

## 2014-12-01 MED ORDER — METOPROLOL TARTRATE 25 MG PO TABS
25.0000 mg | ORAL_TABLET | Freq: Two times a day (BID) | ORAL | Status: DC
  Administered 2014-12-01 – 2014-12-05 (×9): 25 mg via ORAL
  Filled 2014-12-01 (×9): qty 1

## 2014-12-01 MED ORDER — IPRATROPIUM-ALBUTEROL 0.5-2.5 (3) MG/3ML IN SOLN
3.0000 mL | Freq: Four times a day (QID) | RESPIRATORY_TRACT | Status: DC
  Administered 2014-12-01 – 2014-12-05 (×16): 3 mL via RESPIRATORY_TRACT
  Filled 2014-12-01 (×16): qty 3

## 2014-12-01 MED ORDER — INSULIN ASPART 100 UNIT/ML ~~LOC~~ SOLN
0.0000 [IU] | Freq: Every day | SUBCUTANEOUS | Status: DC
  Administered 2014-12-03: 2 [IU] via SUBCUTANEOUS
  Administered 2014-12-04: 3 [IU] via SUBCUTANEOUS
  Administered 2014-12-05: 4 [IU] via SUBCUTANEOUS
  Filled 2014-12-01: qty 2
  Filled 2014-12-01: qty 7
  Filled 2014-12-01: qty 3

## 2014-12-01 MED ORDER — INSULIN ASPART 100 UNIT/ML ~~LOC~~ SOLN
0.0000 [IU] | Freq: Three times a day (TID) | SUBCUTANEOUS | Status: DC
  Administered 2014-12-01: 2 [IU] via SUBCUTANEOUS
  Administered 2014-12-01: 7 [IU] via SUBCUTANEOUS
  Administered 2014-12-02: 1 [IU] via SUBCUTANEOUS
  Administered 2014-12-02: 5 [IU] via SUBCUTANEOUS
  Administered 2014-12-02: 3 [IU] via SUBCUTANEOUS
  Administered 2014-12-03: 9 [IU] via SUBCUTANEOUS
  Administered 2014-12-03: 3 [IU] via SUBCUTANEOUS
  Administered 2014-12-03: 9 [IU] via SUBCUTANEOUS
  Administered 2014-12-04: 2 [IU] via SUBCUTANEOUS
  Administered 2014-12-04: 3 [IU] via SUBCUTANEOUS
  Administered 2014-12-04: 9 [IU] via SUBCUTANEOUS
  Administered 2014-12-05: 5 [IU] via SUBCUTANEOUS
  Administered 2014-12-05: 3 [IU] via SUBCUTANEOUS
  Filled 2014-12-01: qty 5
  Filled 2014-12-01: qty 1
  Filled 2014-12-01: qty 3
  Filled 2014-12-01: qty 2
  Filled 2014-12-01: qty 7
  Filled 2014-12-01: qty 2
  Filled 2014-12-01: qty 3
  Filled 2014-12-01: qty 9
  Filled 2014-12-01: qty 3
  Filled 2014-12-01: qty 9
  Filled 2014-12-01: qty 3
  Filled 2014-12-01: qty 5
  Filled 2014-12-01: qty 9

## 2014-12-01 MED ORDER — INSULIN DETEMIR 100 UNIT/ML ~~LOC~~ SOLN
10.0000 [IU] | Freq: Every day | SUBCUTANEOUS | Status: DC
  Administered 2014-12-01 – 2014-12-03 (×3): 10 [IU] via SUBCUTANEOUS
  Filled 2014-12-01 (×5): qty 0.1

## 2014-12-01 MED ORDER — SODIUM CHLORIDE 0.9 % IJ SOLN: 3.0000 mL | INTRAMUSCULAR | Status: DC | PRN

## 2014-12-01 NOTE — Progress Notes (Signed)
Pt requesting to leave SCD's off till bedtime.

## 2014-12-01 NOTE — Progress Notes (Signed)
[place] [facility] [place] NAME: Felicia Vincent    MR#:  [id number]  DATE OF BIRTH:  1945/12/06  SUBJECTIVE:  CHIEF COMPLAINT:   Chief Complaint  Patient presents with  . Shortness of Breath   some improvement, although still feeling short of breath on Lasix drip. REVIEW OF SYSTEMS:  Review of Systems  Constitutional: Negative for fever, weight loss, malaise/fatigue and diaphoresis.  HENT: Negative for ear discharge, ear pain, hearing loss, nosebleeds, sore throat and tinnitus.   Eyes: Negative for blurred vision and pain.  Respiratory: Positive for shortness of breath. Negative for cough, hemoptysis and wheezing.   Cardiovascular: Negative for chest pain, palpitations, orthopnea and leg swelling.  Gastrointestinal: Negative for heartburn, nausea, vomiting, abdominal pain, diarrhea, constipation and blood in stool.  Genitourinary: Negative for dysuria, urgency and frequency.  Musculoskeletal: Negative for myalgias and back pain.  Skin: Negative for itching and rash.  Neurological: Negative for dizziness, tingling, tremors, focal weakness, seizures, weakness and headaches.  Psychiatric/Behavioral: Negative for depression. The patient is not nervous/anxious.    DRUG ALLERGIES:   Allergies  Allergen Reactions  . Codeine Nausea Only  . Prednisone Other (See Comments)    Pts daughter states that this medication "makes her evil".     VITALS:  Blood pressure 159/60, pulse 85, temperature 98.8 F (37.1 C), temperature source Oral, resp. rate 18, height 5\' 1"  (1.549 m), weight 81.602 kg (179 lb 14.4 oz), SpO2 90 %. PHYSICAL EXAMINATION:  Physical Exam  Constitutional: She is oriented to person, place, and time and well-developed, well-nourished, and in no distress.  HENT:  Head: Normocephalic and atraumatic.  Eyes: Conjunctivae and EOM are normal. Pupils are equal, round, and reactive to light.  Neck: Normal range of motion. Neck supple. No  tracheal deviation present. No thyromegaly present.  Cardiovascular: Normal rate, regular rhythm and normal heart sounds.   Pulmonary/Chest: Effort normal. No respiratory distress. She has no wheezes. She has rales. She exhibits no tenderness.  Abdominal: Soft. Bowel sounds are normal. She exhibits no distension. There is no tenderness.  Musculoskeletal: Normal range of motion.  Neurological: She is alert and oriented to person, place, and time. No cranial nerve deficit.  Skin: Skin is warm and dry. No rash noted.  Psychiatric: Mood and affect normal.   LABORATORY PANEL:   CBC  Recent Labs Lab 12/01/14 0354  WBC 8.2  HGB 7.6*  HCT 22.7*  PLT 147*   ------------------------------------------------------------------------------------------------------------------ Chemistries   Recent Labs Lab 11/30/14 1655 12/01/14 0354  NA 134* 137  K 5.0 4.6  CL 96* 103  CO2 29 28  GLUCOSE 245* 61*  BUN 37* 36*  CREATININE 3.18* 2.94*  CALCIUM 8.7* 8.6*  AST 12*  --   ALT 12*  --   ALKPHOS 81  --   BILITOT 0.6  --    RADIOLOGY:  Dg Chest 2 View  11/30/2014   CLINICAL DATA:  Short of breath  EXAM: CHEST  2 VIEW  COMPARISON:  11/16/2014  FINDINGS: Lungs are under aerated. Heterogeneous opacities throughout both lungs are worrisome for patchy bilateral extensive airspace disease. Heart remains moderately enlarged. No pneumothorax.  IMPRESSION: Extensive bilateral airspace disease.   Electronically Signed   By: Marybelle Killings M.D.   On: 11/30/2014 17:32   Ct Chest Wo Contrast  11/30/2014   CLINICAL DATA:  Respiratory failure and pneumonia  EXAM: CT CHEST WITHOUT CONTRAST  TECHNIQUE: Multidetector CT imaging of the chest was performed  following the standard protocol without IV contrast.  COMPARISON:  None.  FINDINGS: 1.5 cm precarinal lymph node. 12 mm short axis diameter right peritracheal lymph node. 18 mm subcarinal node.  Dense calcification in the left lobe of the thyroid gland.   Atherosclerotic calcifications in the mediastinum including 3 vessel coronary artery calcification.  Moderate bilateral pleural effusions right greater than left.  Ground-glass and confluent pulmonary opacities are seen throughout both lungs associated with the areas of interlobular septal thickening.  No vertebral compression deformity.  IMPRESSION: Bilateral pleural effusions  Mediastinal adenopathy. Findings may be related to neoplasm, inflammatory disease, or pulmonary edema  Bilateral pulmonary opacities as described. Findings may represent pulmonary edema or an inflammatory process. If the findings are chronic, interstitial lung disease or chronic organizing pneumonia are considerations.   Electronically Signed   By: Marybelle Killings M.D.   On: 11/30/2014 21:13   ASSESSMENT AND PLAN:  * Acute on chronic respiratory failure with hypoxia At baseline 2 L oxygen nasal cannula at home. Most likely this is secondary to CHF exacerbation  CT scan of the chest without contrast showed b/l pl effusions Doppler studies for lower extremities - to r/o DVTs.  * Acute diastolic congestive heart failure with pulmonary hypertension on lasix drip. Consider cardio (dr Clayborn Bigness) if need. Neg 500 cc so far.  * Recent pneumonia Currently there is no need for antibiotics so I will not start it.  * Hypertension Continue amlodipine and hydralazine furosemide.  * Diabetes Continue glimepiride and Levemir, on insulin sliding scale coverage.  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 Management plans discussed with the patient, family and they are in agreement.  CODE STATUS: Full Code  TOTAL TIME TAKING CARE OF THIS PATIENT: 35 minutes.   More than 50% of the time was spent in counseling/coordination of care: YES  POSSIBLE D/C IN 1-2 DAYS,  DEPENDING ON CLINICAL CONDITION.   [facility], [name] M.D on 12/01/2014 at 3:26 PM  Between 7am to 6pm - Pager - [phone]  After 6pm go to [email or web address] - password [email or web address] [facility] [facility]  Office  [phone]  CC:  Primary care physician; Lavera Guise, MD

## 2014-12-01 NOTE — Plan of Care (Signed)
Problem: Phase I Progression Outcomes Goal: Flu/PneumoVaccines if indicated Outcome: Not Progressing Family refused

## 2014-12-01 NOTE — Consult Note (Signed)
[facility] Consultation      Date: 12/01/2014,   MRN# [id number] Finnlee Guarnieri [place] [birth year] Code Status:     Code Status Orders        Start     Ordered   11/30/14 2130  Full code   Continuous     11/30/14 2129     Hosp day:@LENGTHOFSTAYDAYS @ Referring MD: @ATDPROV @     PCP:      AdmissionWeight: 190 lb (86.183 kg)                 CurrentWeight: 179 lb 14.4 oz (81.602 kg) (verified by victoria) Johann Capers is a 69 y.o. old female seen in consultation for hypoxia     CHIEF COMPLAINT:   SOb and hypoxia at home. DOE   HISTORY OF PRESENT ILLNESS  69 y.o. female with a known history of hypertension, diabetes, stroke, pneumonia, anemia- was admitted to hospital 2 weeks ago and then complicated course because of pneumonia and had intubation also found to have chronic renal failure and diastolic heart failure was discharged home with oxygen 10 days ago. -she gradually  feeling worse and she is feeling excessive short of breath with walking chest 2-3 steps to her bedside commode. - They spoke to her primary care doctor and she gave her azithromycin prescription but she did not had any change in her condition so finally decided to think her to emergency room.  -As per them are oxygen level was dropping of 75 and 80. On further questioning they denies her having any worsening in her cough or sputum production, no fever.   Ct chest reviewed-findings c/w b/l effusions with edema    PAST MEDICAL HISTORY   Past Medical History  Diagnosis Date  . Hypertension   . Diabetes mellitus without complication ([place])   . TIA (transient ischemic attack)   . Cataract   . Stroke ([place])   . Dizziness   . Anemia   . Pneumonia      SURGICAL HISTORY   Past Surgical History  Procedure Laterality Date  . Cholecystectomy    . Lithotripsy    . Abdominal hysterectomy    . Breast surgery       FAMILY HISTORY   Family History  Problem Relation Age of Onset    . Diabetes Mother      SOCIAL HISTORY   Social History  Substance Use Topics  . Smoking status: Never Smoker   . Smokeless tobacco: Never Used  . Alcohol Use: No     MEDICATIONS    Home Medication:  No current outpatient prescriptions on file.  Current Medication:  Current facility-administered medications:  .  amLODipine (NORVASC) tablet 5 mg, 5 mg, Oral, QHS, Vaughan Basta, MD, 5 mg at 11/30/14 2240 .  aspirin EC tablet 81 mg, 81 mg, Oral, Daily, Vaughan Basta, MD, 81 mg at 12/01/14 0942 .  atorvastatin (LIPITOR) tablet 10 mg, 10 mg, Oral, QHS, Vaughan Basta, MD, 10 mg at 11/30/14 2239 .  benzonatate (TESSALON) capsule 200 mg, 200 mg, Oral, TID PRN, Vaughan Basta, MD, 200 mg at 12/01/14 0608 .  budesonide (PULMICORT) nebulizer solution 0.25 mg, 0.25 mg, Nebulization, BID, Vaughan Basta, MD, 0.25 mg at 12/01/14 0848 .  citalopram (CELEXA) tablet 40 mg, 40 mg, Oral, QPM, Vaughan Basta, MD, 40 mg at 11/30/14 2238 .  clopidogrel (PLAVIX) tablet 75 mg, 75 mg, Oral, QHS, Vaughan Basta, MD, 75 mg at 11/30/14 2239 .  donepezil (ARICEPT) tablet 5 mg,  5 mg, Oral, Daily, Vaughan Basta, MD, 5 mg at 12/01/14 0942 .  furosemide (LASIX) 250 mg in dextrose 5 % 250 mL (1 mg/mL) infusion, 2 mg/hr, Intravenous, Continuous, Vaughan Basta, MD, Last Rate: 2 mL/hr at 11/30/14 2240, 2 mg/hr at 11/30/14 2240 .  glimepiride (AMARYL) tablet 2 mg, 2 mg, Oral, BID, Vaughan Basta, MD, 2 mg at 12/01/14 0942 .  guaiFENesin (MUCINEX) 12 hr tablet 600 mg, 600 mg, Oral, BID, Vaughan Basta, MD, 600 mg at 12/01/14 0943 .  guaiFENesin-dextromethorphan (ROBITUSSIN DM) 100-10 MG/5ML syrup 10 mL, 10 mL, Oral, Q6H PRN, Vaughan Basta, MD .  heparin injection 5,000 Units, 5,000 Units, Subcutaneous, 3 times per day, Vaughan Basta, MD, 5,000 Units at 11/30/14 2239 .  hydrALAZINE (APRESOLINE) tablet 50 mg, 50 mg, Oral,  TID, Vaughan Basta, MD, 50 mg at 12/01/14 0942 .  insulin aspart (novoLOG) injection 0-5 Units, 0-5 Units, Subcutaneous, QHS, Vipul Shah, MD .  insulin aspart (novoLOG) injection 0-9 Units, 0-9 Units, Subcutaneous, TID WC, Vipul Shah, MD .  insulin detemir (LEVEMIR) injection 10 Units, 10 Units, Subcutaneous, QHS, Vipul Shah, MD .  ipratropium-albuterol (DUONEB) 0.5-2.5 (3) MG/3ML nebulizer solution 3 mL, 3 mL, Nebulization, Q6H PRN, Vaughan Basta, MD, 3 mL at 12/01/14 0600 .  isosorbide mononitrate (IMDUR) 24 hr tablet 90 mg, 90 mg, Oral, BID, Vaughan Basta, MD, 90 mg at 12/01/14 0942 .  Liraglutide SOPN 1.8 mg, 1.8 mg, Subcutaneous, Daily, Vaughan Basta, MD .  ondansetron [facility]) tablet 4 mg, 4 mg, Oral, TID PRN, Vaughan Basta, MD .  polycarbophil (FIBERCON) tablet 625 mg, 625 mg, Oral, Daily, Vaughan Basta, MD, 625 mg at 12/01/14 0943 .  sodium chloride 0.9 % injection 3 mL, 3 mL, Intravenous, Q12H, Vaughan Basta, MD, 3 mL at 11/30/14 2241 .  sodium chloride 0.9 % injection 3 mL, 3 mL, Intravenous, PRN, Vaughan Basta, MD .  vitamin B-12 (CYANOCOBALAMIN) tablet 1,000 mcg, 1,000 mcg, Oral, Daily, Vaughan Basta, MD, 1,000 mcg at 12/01/14 0945    ALLERGIES   Codeine and Prednisone     REVIEW OF SYSTEMS   Review of Systems  Constitutional: Positive for malaise/fatigue and diaphoresis. Negative for fever, chills and weight loss.  HENT: Positive for congestion. Negative for hearing loss.   Eyes: Negative for blurred vision and double vision.  Respiratory: Positive for shortness of breath. Negative for cough, hemoptysis and wheezing.   Cardiovascular: Positive for leg swelling. Negative for chest pain, palpitations and orthopnea.  Gastrointestinal: Negative for heartburn, nausea and vomiting.  Genitourinary: Negative for dysuria and urgency.  Musculoskeletal: Negative for myalgias, back pain and neck pain.  Skin:  Negative for rash.  Neurological: Positive for dizziness and weakness. Negative for headaches.  Psychiatric/Behavioral: The patient is nervous/anxious.   All other systems reviewed and are negative.    VS: BP 161/55 mmHg  Pulse 88  Temp(Src) 98.6 F (37 C) (Oral)  Resp 18  Ht 5\' 1"  (1.549 m)  Wt 179 lb 14.4 oz (81.602 kg)  BMI 34.01 kg/m2  SpO2 88%     PHYSICAL EXAM  Physical Exam  Constitutional: She is oriented to person, place, and time. She appears well-developed and well-nourished. No distress.  HENT:  Head: Normocephalic and atraumatic.  Mouth/Throat: No oropharyngeal exudate.  Eyes: EOM are normal. Pupils are equal, round, and reactive to light. No scleral icterus.  Neck: Normal range of motion. Neck supple.  Cardiovascular: Normal rate, regular rhythm and normal heart sounds.   No murmur heard. Pulmonary/Chest: No stridor. No  respiratory distress. She has no wheezes. She has rales.  Abdominal: Soft. Bowel sounds are normal. She exhibits no distension. There is no tenderness. There is no rebound.  Musculoskeletal: Normal range of motion. She exhibits no edema.  Neurological: She is alert and oriented to person, place, and time. She displays normal reflexes. Coordination normal.  Skin: Skin is warm. She is not diaphoretic.  Psychiatric: She has a normal mood and affect.        LABS    Recent Labs     11/30/14  1655  12/01/14  0354  HGB  8.2*  7.6*  HCT  24.2*  22.7*  MCV  90.4  84.8  WBC  7.2  8.2  BUN  37*  36*  CREATININE  3.18*  2.94*  GLUCOSE  245*  61*  CALCIUM  8.7*  8.6*  ,    No results for input(s): PH in the last 72 hours.  Invalid input(s): PCO2, PO2, BASEEXCESS, BASEDEFICITE, TFT    CULTURE RESULTS   Recent Results (from the past 240 hour(s))  Blood culture (routine x 2)     Status: None (Preliminary result)   Collection Time: 11/30/14  6:04 PM  Result Value Ref Range Status   Specimen Description BLOOD RIGHT AC  Final   Special  Requests BOTTLES DRAWN AEROBIC AND ANAEROBIC  6CC  Final   Culture NO GROWTH < 12 HOURS  Final   Report Status PENDING  Incomplete  Blood culture (routine x 2)     Status: None (Preliminary result)   Collection Time: 11/30/14  6:11 PM  Result Value Ref Range Status   Specimen Description BLOOD LEFT AC  Final   Special Requests BOTTLES DRAWN AEROBIC AND ANAEROBIC  1CC  Final   Culture NO GROWTH < 12 HOURS  Final   Report Status PENDING  Incomplete          IMAGING    Dg Chest 1 View  11/16/2014   CLINICAL DATA:  Follow-up pneumonia  EXAM: CHEST 1 VIEW  COMPARISON:  11/14/2014  FINDINGS: Cardiomediastinal silhouette is stable. Right IJ catheter with tip in right atrium. No pulmonary edema. Again noted patchy bilateral airspace disease without change in aeration  IMPRESSION: No pulmonary edema. Right IJ catheter with tip in right atrium. Again noted patchy bilateral airspace disease without change in aeration.   Electronically Signed   By: Lahoma Crocker M.D.   On: 11/16/2014 10:49   Dg Chest 1 View  11/14/2014   CLINICAL DATA:  Dyspnea.  EXAM: CHEST  1 VIEW  COMPARISON:  11/13/2014 and 11/10/2014  FINDINGS: Right IJ central venous catheter with tip just below the cavoatrial junction without significant change. Interval removal of nasogastric tube and endotracheal tube.  Lungs are adequately inflated demonstrate persistent bilateral patchy airspace opacification which may be slightly worse over the right upper lobe compared to 11/13/2014, although greatly improved compared to 11/10/2014. No evidence of effusion. Mild stable cardiomegaly. Remainder of the exam is unchanged.  IMPRESSION: Patchy bilateral airspace process with marked improvement compared to 11/10/2014.  Stable cardiomegaly.  Right IJ central venous catheter with tip just below the cavoatrial junction.   Electronically Signed   By: Marin Olp M.D.   On: 11/14/2014 08:13   Dg Chest 2 View  11/30/2014   CLINICAL DATA:  Short of breath   EXAM: CHEST  2 VIEW  COMPARISON:  11/16/2014  FINDINGS: Lungs are under aerated. Heterogeneous opacities throughout both lungs are worrisome for patchy bilateral  extensive airspace disease. Heart remains moderately enlarged. No pneumothorax.  IMPRESSION: Extensive bilateral airspace disease.   Electronically Signed   By: Marybelle Killings M.D.   On: 11/30/2014 17:32   Dg Abd 1 View  11/10/2014   CLINICAL DATA:  Encounter for intubation Z01.818 (ICD-10-CM) Encounter for orogastric (OG) tube placement Z46.59 (ICD-10-CM)  EXAM: ABDOMEN - 1 VIEW  COMPARISON:  None.  FINDINGS: Orogastric tube passes well below the diaphragm into the distal stomach, projecting in the right mid abdomen.  There is a paucity of bowel gas.  Vascular calcifications are noted.  Lung bases irregular interstitial airspace opacities.  IMPRESSION: Orogastric tube tip projects in the distal stomach.   Electronically Signed   By: Lajean Manes M.D.   On: 11/10/2014 20:29   Ct Chest Wo Contrast  11/30/2014   CLINICAL DATA:  Respiratory failure and pneumonia  EXAM: CT CHEST WITHOUT CONTRAST  TECHNIQUE: Multidetector CT imaging of the chest was performed following the standard protocol without IV contrast.  COMPARISON:  None.  FINDINGS: 1.5 cm precarinal lymph node. 12 mm short axis diameter right peritracheal lymph node. 18 mm subcarinal node.  Dense calcification in the left lobe of the thyroid gland.  Atherosclerotic calcifications in the mediastinum including 3 vessel coronary artery calcification.  Moderate bilateral pleural effusions right greater than left.  Ground-glass and confluent pulmonary opacities are seen throughout both lungs associated with the areas of interlobular septal thickening.  No vertebral compression deformity.  IMPRESSION: Bilateral pleural effusions  Mediastinal adenopathy. Findings may be related to neoplasm, inflammatory disease, or pulmonary edema  Bilateral pulmonary opacities as described. Findings may represent  pulmonary edema or an inflammatory process. If the findings are chronic, interstitial lung disease or chronic organizing pneumonia are considerations.   Electronically Signed   By: Marybelle Killings M.D.   On: 11/30/2014 21:13   Dg Chest Port 1 View  11/13/2014   CLINICAL DATA:  Followup acute hypoxic respiratory failure secondary to bilateral pneumonia. Cough. Now possible CHF.  EXAM: PORTABLE CHEST - 1 VIEW  COMPARISON:  11/11/2014  FINDINGS: Support devices remain in stable position. Improving aeration of the lungs with decreasing bilateral airspace disease. Cardiomegaly. No visible effusions. No acute bony abnormality.  IMPRESSION: Bilateral airspace opacities have improved, likely improving edema and/or pneumonia.   Electronically Signed   By: Rolm Baptise M.D.   On: 11/13/2014 08:57   Dg Chest Port 1 View  11/11/2014   CLINICAL DATA:  Respiratory failure, ventilatory support  EXAM: PORTABLE CHEST - 1 VIEW  COMPARISON:  11/10/2014  FINDINGS: Endotracheal tube 1.6 cm above the carina. NG tube extends into the stomach with the tip not visualized. Right IJ central line tip proximal right atrium as before. Severe patchy bilateral airspace process throughout both lungs with some sparing of the left upper lobe compatible with pneumonia versus alveolar edema or hemorrhage. No enlarging effusion or pneumothorax. No significant interval change.  IMPRESSION: Stable support apparatus  Persistent asymmetric bilateral airspace disease compatible with pneumonia. Little interval change.   Electronically Signed   By: Jerilynn Mages.  Shick M.D.   On: 11/11/2014 09:31   Dg Chest Port 1 View  11/10/2014   CLINICAL DATA:  Intubated  EXAM: PORTABLE CHEST - 1 VIEW  COMPARISON:  11/10/2014  FINDINGS: Cardiomediastinal silhouette is stable. Bilateral airspace opacities are again noted without change in aeration. Endotracheal tube in place with tip 2.8 cm above the carina. NG tube in place with tip in proximal stomach. There is no  pneumothorax. There is right IJ central line with tip in SVC right atrium junction.  IMPRESSION: Endotracheal and NG tube in place. Again noted bilateral airspace opacities without change in aeration. No pneumothorax.   Electronically Signed   By: Lahoma Crocker M.D.   On: 11/10/2014 20:20   Dg Chest Port 1 View  11/10/2014   CLINICAL DATA:  Evaluate pneumonia.  EXAM: PORTABLE CHEST - 1 VIEW  COMPARISON:  11/09/2014  FINDINGS: The normal heart size. Aortic atherosclerosis identified. Persistent bilateral pulmonary opacities identified. There is slightly improved aeration to the right upper lobe with worsening aeration to the right lower lobe.  IMPRESSION: 1. Persistent bilateral airspace opacities with slightly improved aeration to the right upper lobe.   Electronically Signed   By: Kerby Moors M.D.   On: 11/10/2014 09:31   Dg Chest Port 1 View  11/09/2014   CLINICAL DATA:  Respiratory failure.  EXAM: PORTABLE CHEST - 1 VIEW  COMPARISON:  11/07/2014 .  FINDINGS: Mediastinum is stable. Stable cardiomegaly. Persistent multifocal pulmonary infiltrates particularly prominent in the right upper lobe again noted. Partial atelectasis of the right upper lobe noted. Pleural fluid in the right major fissure cannot be excluded. No pneumothorax. No acute osseus abnormality .  IMPRESSION: 1. Persistent multifocal bilateral pulmonary infiltrates, particular prominent in the right upper lobe. Partial atelectasis of the right upper lobe present . Findings are most consistent with multifocal pneumonia. Small amount of pleural fluid in the right major fissure cannot be excluded.  2.  Stable cardiomegaly.   Electronically Signed   By: Marcello Moores  Register   On: 11/09/2014 08:05   Dg Chest Port 1 View  11/07/2014   CLINICAL DATA:  Dyspnea.  EXAM: PORTABLE CHEST - 1 VIEW  COMPARISON:  October 31, 2014  FINDINGS: The heart size and mediastinal contours are stable. The heart size is enlarged. There is diffuse consolidation throughout  the right lung. Patchy consolidation of the left mid and lung base are identified. The visualized skeletal structures are unremarkable.  IMPRESSION: Pneumonias in bilateral lungs.   Electronically Signed   By: Abelardo Diesel M.D.   On: 11/07/2014 13:29        ASSESSMENT/PLAN   69 yo white female admitted for acute Worsening of SOB from acute diastolic heart failure with renal insufficiency  1.oxygen as needed 2.patient REFUSES bipap 3.lasix infusion as tolerated 4.no indication for Bronch/abx or steroids at this time  I have no other recommendations at this time    I have personally obtained a history, examined the patient, evaluated laboratory and independently reviewed imaging results, formulated the assessment and plan and placed orders.  The Patient requires high complexity decision making for assessment and support, frequent evaluation and titration of therapies, application of advanced monitoring technologies and extensive interpretation of multiple databases.   Patient/Family are satisfied with Plan of action and management. All questions answered  Corrin Parker, M.D.  [facility]  Medical Director [place] Director [facility] Cardio-Pulmonary Department

## 2014-12-01 NOTE — Consult Note (Signed)
[facility]  CARDIOLOGY CONSULT NOTE  Patient ID: Felicia Vincent MRN: [id number] DOB/AGE: May 29, 1945 69 y.o.  Admit date: 11/30/2014 Referring Physician Dr. Manuella Ghazi Primary Physician Dr. Clayborn Bigness Primary Cardiologist   Reason for Consultation pulmonary edema  HPI: Patient is a  69 year old female with no prior cardiac history who was admitted with shortness of breath.  She recent was admitted to [facility] with complaints of shortness of breath.  She was noted to have pneumonia.  She was treated with antibiotics.  Echocardiogram during that admission was read as showing ejection fraction of 55-65%.  She has mild to moderate pulmonary hypertension with severe tricuspid regurgitation. She was discharged to home on 20 mg of furosemide.  She had appointment scheduled with the heart failure clinic which she does not make.  She developed increasing shortness of breath and exertional hypoxia over the 4 days prior to admission.  She was sent home on oxygen and she apparently has been compliant with this.  She and her son have attempted to decrease her sodium intake and sugar intake.  On chest x-ray on readmission she was noted to have patchy bilateral extensive airspace disease with chest CT read as showing bilateral pleural effusions with mediastinal adenopathy.  She was diuresed with improvement.  She  Ruled out for myocardial infarction with no significant serum troponin elevation.  She has acute on chronic renal insufficiency with a serum creatinine of 2.94.  Two weeks ago her serum creatinine was 3.62.  Her GFR is 15.   ROS Review of Systems - History obtained from chart review and the patient General ROS: positive for  - fatigue and Dyspnea on exertion Respiratory ROS: positive for - orthopnea and shortness of breath Cardiovascular ROS: positive for - dyspnea on exertion and edema Gastrointestinal ROS: negative Musculoskeletal ROS: negative Neurological ROS: no TIA or  stroke symptoms   Past Medical History  Diagnosis Date  . Hypertension   . Diabetes mellitus without complication ([place])   . TIA (transient ischemic attack)   . Cataract   . Stroke ([place])   . Dizziness   . Anemia   . Pneumonia     Family History  Problem Relation Age of Onset  . Diabetes Mother     Social History   Social History  . Marital Status: Widowed    Spouse Name: N/A  . Number of Children: N/A  . Years of Education: N/A   Occupational History  . Not on file.   Social History Main Topics  . Smoking status: Never Smoker   . Smokeless tobacco: Never Used  . Alcohol Use: No  . Drug Use: No  . Sexual Activity: Not on file   Other Topics Concern  . Not on file   Social History Narrative    Past Surgical History  Procedure Laterality Date  . Cholecystectomy    . Lithotripsy    . Abdominal hysterectomy    . Breast surgery       Prescriptions prior to admission  Medication Sig Dispense Refill Last Dose  . amLODipine (NORVASC) 5 MG tablet Take 5 mg by mouth at bedtime.   11/29/2014 at pm  . aspirin EC 81 MG tablet Take 81 mg by mouth daily.   11/30/2014 at am  . atorvastatin (LIPITOR) 10 MG tablet Take 10 mg by mouth at bedtime.   11/29/2014 at pm  . azithromycin (ZITHROMAX) 250 MG tablet Take by mouth See admin instructions. 500 mg on day  1, then 1 tablet daily for 9 days   11/30/2014 at am  . benzonatate (TESSALON) 200 MG capsule Take 200 mg by mouth 3 (three) times daily as needed for cough.   PRN  . budesonide (PULMICORT) 0.25 MG/2ML nebulizer solution Take 2 mLs (0.25 mg total) by nebulization 2 (two) times daily. 60 mL 0 11/30/2014 at am  . citalopram (CELEXA) 40 MG tablet Take 40 mg by mouth every evening.   11/29/2014 at pm  . clopidogrel (PLAVIX) 75 MG tablet Take 75 mg by mouth at bedtime.    11/29/2014 at pm  . donepezil (ARICEPT) 5 MG tablet Take 5 mg by mouth daily.    11/30/2014 at am  . furosemide (LASIX) 20 MG tablet Take 20 mg by mouth daily.    11/30/2014 at am  . glimepiride (AMARYL) 4 MG tablet Take 2 mg by mouth 2 (two) times daily.   11/30/2014 at am  . hydrALAZINE (APRESOLINE) 25 MG tablet Take 50 mg by mouth 3 (three) times daily.   11/30/2014 at am  . insulin detemir (LEVEMIR) 100 UNIT/ML injection Inject 30-35 Units into the skin every evening.   11/29/2014 at pm  . ipratropium-albuterol (DUONEB) 0.5-2.5 (3) MG/3ML SOLN Take 3 mLs by nebulization every 6 (six) hours as needed. (Patient taking differently: Take 3 mLs by nebulization every 6 (six) hours as needed (shortness of breath). ) 300 mL 0 PRN  . isosorbide mononitrate (IMDUR) 60 MG 24 hr tablet Take 90 mg by mouth 2 (two) times daily.   11/30/2014 at am  . Liraglutide (VICTOZA) 18 MG/3ML SOPN Inject 1.8 mg into the skin daily.   11/30/2014 at am  . ondansetron (ZOFRAN) 4 MG tablet Take 4 mg by mouth 3 (three) times daily as needed for nausea or vomiting.   PRN  . polycarbophil (FIBERCON) 625 MG tablet Take 625 mg by mouth daily.   11/30/2014 at am  . vitamin B-12 (CYANOCOBALAMIN) 1000 MCG tablet Take 1,000 mcg by mouth daily.   11/30/2014 at am  . albuterol (PROVENTIL HFA;VENTOLIN HFA) 108 (90 BASE) MCG/ACT inhaler Inhale 2 puffs into the lungs every 6 (six) hours as needed for wheezing or shortness of breath. (Patient not taking: Reported on 11/30/2014) 1 Inhaler 2 11/07/2014 at Unknown time  . guaiFENesin (MUCINEX) 600 MG 12 hr tablet Take 1 tablet (600 mg total) by mouth 2 (two) times daily. (Patient not taking: Reported on 11/30/2014) 20 tablet 0   . guaiFENesin-dextromethorphan (ROBITUSSIN DM) 100-10 MG/5ML syrup Take 10 mLs by mouth every 6 (six) hours as needed for cough. (Patient not taking: Reported on 11/30/2014) 118 mL 0     Physical Exam: Blood pressure 159/60, pulse 85, temperature 98.8 F (37.1 C), temperature source Oral, resp. rate 18, height 5\' 1"  (1.549 m), weight 81.602 kg (179 lb 14.4 oz), SpO2 89 %.  General appearance: alert and cooperative Resp: diminished breath  sounds bilaterally Cardio: regular rate and rhythm GI: soft, non-tender; bowel sounds normal; no masses,  no organomegaly Extremities: edema 2+ Neurologic: Grossly normal Labs:   Lab Results  Component Value Date   WBC 8.2 12/01/2014   HGB 7.6* 12/01/2014   HCT 22.7* 12/01/2014   MCV 84.8 12/01/2014   PLT 147* 12/01/2014    Recent Labs Lab 11/30/14 1655 12/01/14 0354  NA 134* 137  K 5.0 4.6  CL 96* 103  CO2 29 28  BUN 37* 36*  CREATININE 3.18* 2.94*  CALCIUM 8.7* 8.6*  PROT 6.9  --  BILITOT 0.6  --   ALKPHOS 81  --   ALT 12*  --   AST 12*  --   GLUCOSE 245* 61*   Lab Results  Component Value Date   TROPONINI <0.03 11/30/2014      Radiology:  As per above mild bilateral pleural effusions. EKG:   Sinus rhythm nonspecific ST T wave changes  ASSESSMENT AND PLAN:   Patient with history of recent admission with pneumonia and now readmitted with bilateral pleural effusions and hypoxia.  She has improved with diuresis.  She also has chronic renal insufficiency. This is grade 4-5.  She is being followed by Nephrology.  She is clinically improved with diuresis.  She may hemodialysis in the future.  Will attempt to increase her furosemide following renal function and her nephrology guidance.  Her GFR is 15. She has a history of diastolic congestive heart failure [place] heart Association   Grade 3-4.  Well at low dose metoprolol   Tartrate at 25 mg twice daily and follow.  If blood pressure becomes problem would consider decreasing isosorbide or amlodipine.  The patient was instructed along with her son regarding low-sodium diet, daily weights.  Will change furosemide to  PL after adequate diuresis Signed: Teodoro Spray MD, [facility] 12/01/2014, 1:14 PM

## 2014-12-01 NOTE — Progress Notes (Signed)
Subjective:   Admitted yesterday for shortness of breath. CT chest with pulmonary edema and effusions.  Home diuretic regimen of fursoemide 7m daily.  Baseline eGFR of 16.  Recent admission from 9/13 to 99/16for diastolic congestive heart failure and community acquired pneumonia.  Started on furosemide gtt.   Objective:  Vital signs in last 24 hours:  Temp:  [98 F (36.7 C)-98.8 F (37.1 C)] 98.6 F (37 C) (10/07 0728) Pulse Rate:  [74-88] 88 (10/07 0728) Resp:  [16-29] 18 (10/07 0728) BP: (161-188)/(55-74) 161/55 mmHg (10/07 0728) SpO2:  [88 %-99 %] 88 % (10/07 0848) Weight:  [81.602 kg (179 lb 14.4 oz)-86.183 kg (190 lb)] 81.602 kg (179 lb 14.4 oz) (10/07 0413)  Weight change:  Filed Weights   11/30/14 1640 12/01/14 0413  Weight: 86.183 kg (190 lb) 81.602 kg (179 lb 14.4 oz)    Intake/Output:    Intake/Output Summary (Last 24 hours) at 12/01/14 1014 Last data filed at 12/01/14 0900  Gross per 24 hour  Intake 488.67 ml  Output    750 ml  Net -261.33 ml     Physical Exam: General: frail, elderly, in mild respiratory distress  HEENT [place]/AT EOMI hearing intact OM moist  Neck supple  Pulm/lungs Bilateral diffuse crackles  CVS/Heart irregular rhythm, no rubs or gallop  Abdomen:  soft, nontender, BS present  Extremities: Trace lower extremity edema  Neurologic: Awake, alert, sitting up, follows commands  Skin: No acute rashes           Basic Metabolic Panel:   Recent Labs Lab 11/30/14 1655 12/01/14 0354  NA 134* 137  K 5.0 4.6  CL 96* 103  CO2 29 28  GLUCOSE 245* 61*  BUN 37* 36*  CREATININE 3.18* 2.94*  CALCIUM 8.7* 8.6*     CBC:  Recent Labs Lab 11/30/14 1655 12/01/14 0354  WBC 7.2 8.2  HGB 8.2* 7.6*  HCT 24.2* 22.7*  MCV 90.4 84.8  PLT 160 147*      Microbiology:  Recent Results (from the past 720 hour(s))  Culture, blood (routine x 2)     Status: None   Collection Time: 11/07/14 12:30 PM  Result Value Ref Range Status    Specimen Description BLOOD RIGHT ASSIST CONTROL  Final   Special Requests   Final    BOTTLES DRAWN AEROBIC AND ANAEROBIC  2CC AERO 1 CC ANAERO   Culture NO GROWTH 5 DAYS  Final   Report Status 11/12/2014 FINAL  Final  Culture, blood (routine x 2)     Status: None   Collection Time: 11/07/14 12:48 PM  Result Value Ref Range Status   Specimen Description BLOOD LEFT FATTY CASTS  Final   Special Requests BOTTLES DRAWN AEROBIC AND ANAEROBIC  1CC  Final   Culture NO GROWTH 5 DAYS  Final   Report Status 11/12/2014 FINAL  Final  MRSA PCR Screening     Status: None   Collection Time: 11/07/14  6:06 PM  Result Value Ref Range Status   MRSA by PCR NEGATIVE NEGATIVE Final    Comment:        The GeneXpert MRSA Assay (FDA approved for NASAL specimens only), is one component of a comprehensive MRSA colonization surveillance program. It is not intended to diagnose MRSA infection nor to guide or monitor treatment for MRSA infections.   C difficile quick scan w PCR reflex     Status: None   Collection Time: 11/08/14  4:15 AM  Result Value Ref Range Status  C Diff antigen NEGATIVE NEGATIVE Final   C Diff toxin NEGATIVE NEGATIVE Final   C Diff interpretation Negative for C. difficile  Final  Blood culture (routine x 2)     Status: None (Preliminary result)   Collection Time: 11/30/14  6:04 PM  Result Value Ref Range Status   Specimen Description BLOOD RIGHT AC  Final   Special Requests BOTTLES DRAWN AEROBIC AND ANAEROBIC  6CC  Final   Culture NO GROWTH < 12 HOURS  Final   Report Status PENDING  Incomplete  Blood culture (routine x 2)     Status: None (Preliminary result)   Collection Time: 11/30/14  6:11 PM  Result Value Ref Range Status   Specimen Description BLOOD LEFT AC  Final   Special Requests BOTTLES DRAWN AEROBIC AND ANAEROBIC  1CC  Final   Culture NO GROWTH < 12 HOURS  Final   Report Status PENDING  Incomplete    Coagulation Studies: No results for input(s): LABPROT, INR in  the last 72 hours.  Urinalysis: No results for input(s): COLORURINE, LABSPEC, PHURINE, GLUCOSEU, HGBUR, BILIRUBINUR, KETONESUR, PROTEINUR, UROBILINOGEN, NITRITE, LEUKOCYTESUR in the last 72 hours.  Invalid input(s): APPERANCEUR    Imaging: Dg Chest 2 View  11/30/2014   CLINICAL DATA:  Short of breath  EXAM: CHEST  2 VIEW  COMPARISON:  11/16/2014  FINDINGS: Lungs are under aerated. Heterogeneous opacities throughout both lungs are worrisome for patchy bilateral extensive airspace disease. Heart remains moderately enlarged. No pneumothorax.  IMPRESSION: Extensive bilateral airspace disease.   Electronically Signed   By: Marybelle Killings M.D.   On: 11/30/2014 17:32   Ct Chest Wo Contrast  11/30/2014   CLINICAL DATA:  Respiratory failure and pneumonia  EXAM: CT CHEST WITHOUT CONTRAST  TECHNIQUE: Multidetector CT imaging of the chest was performed following the standard protocol without IV contrast.  COMPARISON:  None.  FINDINGS: 1.5 cm precarinal lymph node. 12 mm short axis diameter right peritracheal lymph node. 18 mm subcarinal node.  Dense calcification in the left lobe of the thyroid gland.  Atherosclerotic calcifications in the mediastinum including 3 vessel coronary artery calcification.  Moderate bilateral pleural effusions right greater than left.  Ground-glass and confluent pulmonary opacities are seen throughout both lungs associated with the areas of interlobular septal thickening.  No vertebral compression deformity.  IMPRESSION: Bilateral pleural effusions  Mediastinal adenopathy. Findings may be related to neoplasm, inflammatory disease, or pulmonary edema  Bilateral pulmonary opacities as described. Findings may represent pulmonary edema or an inflammatory process. If the findings are chronic, interstitial lung disease or chronic organizing pneumonia are considerations.   Electronically Signed   By: Marybelle Killings M.D.   On: 11/30/2014 21:13     Medications:     . amLODipine  5 mg Oral QHS   . aspirin EC  81 mg Oral Daily  . atorvastatin  10 mg Oral QHS  . budesonide  0.25 mg Nebulization BID  . citalopram  40 mg Oral QPM  . clopidogrel  75 mg Oral QHS  . donepezil  5 mg Oral Daily  . furosemide  80 mg Intravenous Q12H  . glimepiride  2 mg Oral BID  . guaiFENesin  600 mg Oral BID  . heparin  5,000 Units Subcutaneous 3 times per day  . hydrALAZINE  50 mg Oral TID  . insulin aspart  0-5 Units Subcutaneous QHS  . insulin aspart  0-9 Units Subcutaneous TID WC  . insulin detemir  10 Units Subcutaneous QHS  .  ipratropium-albuterol  3 mL Nebulization Q6H  . isosorbide mononitrate  90 mg Oral BID  . Liraglutide  1.8 mg Subcutaneous Daily  . polycarbophil  625 mg Oral Daily  . sodium chloride  3 mL Intravenous Q12H  . vitamin B-12  1,000 mcg Oral Daily   benzonatate, guaiFENesin-dextromethorphan, ondansetron, sodium chloride  Assessment/ Plan:  69 y.o. female Felicia Vincent is a 69 y.o. white female with diabetic kidney disease, hypertension, diabetic retinopathy, bilateral cataracts, history of TIA and chronic kidney disease stage V admitted on 11/30/2014 for Edema [R60.9] Dyspnea [R06.00]  1. 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 Patient will need dialysis in the future. However now with volume overload and pulmonary edema. If does not improve with furosemide, will need dialysis. Patient and son made aware.  Chronic Kidney Disease is thought to be due to diabetic nephropathy.  - Continue to monitor urine output, electrolytes and renal function.  - Currently without access.  - renally dose medications.  - Currently not on an ACE-I/ARB. Previously on losartan  2. Acute exacerbation of congestive heart failure diastolic with pulmonary hypertension: on furosemide gtt. Will need to stop drip and start furosemide bolus 67m  - Fluid restriction - Salt restriction - home furosemide dose seems inadequate.   3. Hypertension:  elevated - amlodipine, hydralazine, and imdur.  - ACE-I/ARB as above.  - unclear why patient is not on a beta blocker   LOS: 1 Alzina Golda 10/7/201610:14 AM

## 2014-12-01 NOTE — Patient Outreach (Signed)
[facility] Care Management  24/09/2498  Felicia Vincent [birth year] [id number]   Pt was scheduled for a transition of care call yesterday with her primary case manager, Kathie Rhodes, RN, however, she is on leave and I am covering for her. I did not make the call yesterday and was going to call her this am but unfortunately, I see pt was admitted yesterday. I have advised our hospital liaisons to check on her and advise I will be following her after she goes home. In reviewing pt hospital H & P I am wondering if she would be a palliative care or hospice care candidate.  [name] [facility] [phone]

## 2014-12-01 NOTE — Care Management Note (Signed)
Case Management Note  Patient Details  Name: Felicia Vincent MRN: [id number] Date of Birth: 1946/02/05  Subjective/Objective:   Readmit to [facility]. Here 09/13-09/23 with community acquired PNA. Readmitted now with shortness of breath,acute respiratory failure and CHF exacerbation. [facility] up with [facility] at last discharge for [facility] program. Notified Colletta [place] with [facility] of admission.                  Action/Plan: Following progression   Expected Discharge Date:                  Expected Discharge Plan:  [place]  In-House Referral:     Discharge planning Services  CM Consult  Post Acute Care Choice:  Home Health Choice offered to:     DME Arranged:    DME Agency:     HH Arranged:    [place] Agency:  [place]  Status of Service:  In process, will continue to follow  Medicare Important Message Given:    Date Medicare IM Given:    Medicare IM give by:    Date Additional Medicare IM Given:    Additional Medicare Important Message give by:     If discussed at [place] of Stay Meetings, dates discussed:    Additional Comments:  Jolly Mango, RN 12/01/2014, 11:39 AM

## 2014-12-01 NOTE — Progress Notes (Signed)
Inpatient Diabetes Program Recommendations  AACE/ADA: New Consensus Statement on Inpatient Glycemic Control (2015)  Target Ranges:  Prepandial:   less than 140 mg/dL      Peak postprandial:   less than 180 mg/dL (1-2 hours)      Critically ill patients:  140 - 180 mg/dL    Inpatient Diabetes Program Recommendations: Spoke to MD Dr. Manuella Ghazi regarding my recommendations for a decrease in Levemir dose.    [name], RN, BA, MHA, CDE Diabetes Coordinator Inpatient Diabetes Program  [phone] (Team Pager) [phone] ([place]) 12/01/2014 9:48 AM

## 2014-12-01 NOTE — Care Management Important Message (Signed)
Important Message  Patient Details  Name: Felicia Vincent MRN: [id number] Date of Birth: 1946-01-01   Medicare Important Message Given:  Yes-third notification given    Jolly Mango, RN 12/01/2014, 11:57 AM

## 2014-12-01 NOTE — Progress Notes (Addendum)
Inpatient Diabetes Program Recommendations  AACE/ADA: New Consensus Statement on Inpatient Glycemic Control (2015)  Target Ranges:  Prepandial:   less than 140 mg/dL      Peak postprandial:   less than 180 mg/dL (1-2 hours)      Critically ill patients:  140 - 180 mg/dL   Review of Glycemic Control   Home DM Meds: Levemir 30-35 units QHS (has only been taking about 14 units qday)  Amaryl 2 mg bid  Victoza 1.8 mg daily  Current DM Orders: Levemir 25 units q hs   Novolog 0-9 units tid, Novolog 0-5 units tid, Amaryl 2mg /day, Victoza 1.8mg  qday  Spoke to the patient and her son at the bedside.  Son reveals that they have been checking her blood sugars on a regular basis but only taking about 14 units of Levemir because her appetite has been so poor.  Recommend decreasing Lantus to 10 units qhs and d/c Amaryl while she is inpatient. Manage elevated blood glucose with Novolog correction insulin and increase Lantus and Novolog as needed.   [name], RN, BA, MHA, CDE Diabetes Coordinator Inpatient Diabetes Program  [phone] (Team Pager) [phone] ([place]) 12/01/2014 9:27 AM

## 2014-12-01 NOTE — Progress Notes (Signed)
   12/01/14 1530  Clinical Encounter Type  Visited With Patient and family together  Visit Type Initial;Spiritual support  Referral From Nurse  Consult/Referral To Chaplain  Offered pastoral presence to patient and son. [place]

## 2014-12-01 NOTE — Progress Notes (Signed)
[facility]  Patient Status: Active (receiving services up to time of hospitalization)  [facility] is providing the following services: RN, PT, OT, MSW and HHA  If patient discharges after hours, please call [phone].   Felicia Vincent 12/01/2014, 2:05 PM

## 2014-12-02 ENCOUNTER — Inpatient Hospital Stay: Payer: Medicare Other

## 2014-12-02 LAB — BASIC METABOLIC PANEL
ANION GAP: 11 (ref 5–15)
BUN: 40 mg/dL — ABNORMAL HIGH (ref 6–20)
CHLORIDE: 95 mmol/L — AB (ref 101–111)
CO2: 28 mmol/L (ref 22–32)
Calcium: 8.6 mg/dL — ABNORMAL LOW (ref 8.9–10.3)
Creatinine, Ser: 3.25 mg/dL — ABNORMAL HIGH (ref 0.44–1.00)
GFR calc non Af Amer: 13 mL/min — ABNORMAL LOW (ref >60)
GFR, EST AFRICAN AMERICAN: 15 mL/min — AB (ref >60)
GLUCOSE: 157 mg/dL — AB (ref 65–99)
Potassium: 4.5 mmol/L (ref 3.5–5.1)
Sodium: 134 mmol/L — ABNORMAL LOW (ref 135–145)

## 2014-12-02 LAB — CBC
HEMATOCRIT: 23.6 % — AB (ref 35.0–47.0)
HEMOGLOBIN: 8.3 g/dL — AB (ref 12.0–16.0)
MCH: 31.2 pg (ref 26.0–34.0)
MCHC: 34.9 g/dL (ref 32.0–36.0)
MCV: 89.3 fL (ref 80.0–100.0)
Platelets: 167 10*3/uL (ref 150–440)
RBC: 2.65 MIL/uL — ABNORMAL LOW (ref 3.80–5.20)
RDW: 16.1 % — AB (ref 11.5–14.5)
WBC: 10 10*3/uL (ref 3.6–11.0)

## 2014-12-02 LAB — GLUCOSE, CAPILLARY
GLUCOSE-CAPILLARY: 250 mg/dL — AB (ref 65–99)
GLUCOSE-CAPILLARY: 275 mg/dL — AB (ref 65–99)
GLUCOSE-CAPILLARY: 179 mg/dL — AB (ref 65–99)
Glucose-Capillary: 146 mg/dL — ABNORMAL HIGH (ref 65–99)

## 2014-12-02 MED ORDER — CHLORHEXIDINE GLUCONATE 0.12 % MT SOLN
15.0000 mL | Freq: Two times a day (BID) | OROMUCOSAL | Status: DC
  Administered 2014-12-03 – 2014-12-05 (×6): 15 mL via OROMUCOSAL
  Filled 2014-12-02 (×2): qty 15

## 2014-12-02 MED ORDER — CETYLPYRIDINIUM CHLORIDE 0.05 % MT LIQD
7.0000 mL | Freq: Two times a day (BID) | OROMUCOSAL | Status: DC
  Administered 2014-12-04 – 2014-12-05 (×3): 7 mL via OROMUCOSAL

## 2014-12-02 MED ORDER — FUROSEMIDE 10 MG/ML IJ SOLN
80.0000 mg | Freq: Once | INTRAMUSCULAR | Status: AC
  Administered 2014-12-02: 80 mg via INTRAVENOUS

## 2014-12-02 NOTE — Progress Notes (Signed)
Entered in error

## 2014-12-02 NOTE — Progress Notes (Signed)
[place]  SUBJECTIVE: still sob but improved.   Filed Vitals:   12/01/14 1946 12/02/14 0518 12/02/14 0735 12/02/14 0931  BP: 166/59 169/63  163/59  Pulse: 84 81  84  Temp: 98.5 F (36.9 C) 98.7 F (37.1 C)    TempSrc: Oral Oral    Resp: 19 23    Height:      Weight:      SpO2: 89% 90% 85%     Intake/Output Summary (Last 24 hours) at 12/02/14 1049 Last data filed at 12/02/14 0830  Gross per 24 hour  Intake    720 ml  Output   2800 ml  Net  -2080 ml    LABS: Basic Metabolic Panel:  Recent Labs  12/01/14 0354 12/02/14 0622  NA 137 134*  K 4.6 4.5  CL 103 95*  CO2 28 28  GLUCOSE 61* 157*  BUN 36* 40*  CREATININE 2.94* 3.25*  CALCIUM 8.6* 8.6*   Liver Function Tests:  Recent Labs  11/30/14 1655  AST 12*  ALT 12*  ALKPHOS 81  BILITOT 0.6  PROT 6.9  ALBUMIN 2.4*   No results for input(s): LIPASE, AMYLASE in the last 72 hours. CBC:  Recent Labs  12/01/14 0354 12/02/14 0622  WBC 8.2 10.0  HGB 7.6* 8.3*  HCT 22.7* 23.6*  MCV 84.8 89.3  PLT 147* 167   Cardiac Enzymes:  Recent Labs  11/30/14 1655  TROPONINI <0.03   BNP: Invalid input(s): POCBNP D-Dimer: No results for input(s): DDIMER in the last 72 hours. Hemoglobin A1C: No results for input(s): HGBA1C in the last 72 hours. Fasting Lipid Panel: No results for input(s): CHOL, HDL, LDLCALC, TRIG, CHOLHDL, LDLDIRECT in the last 72 hours. Thyroid Function Tests: No results for input(s): TSH, T4TOTAL, T3FREE, THYROIDAB in the last 72 hours.  Invalid input(s): FREET3 Anemia Panel: No results for input(s): VITAMINB12, FOLATE, FERRITIN, TIBC, IRON, RETICCTPCT in the last 72 hours.   Physical Exam: Blood pressure 163/59, pulse 84, temperature 98.7 F (37.1 C), temperature source Oral, resp. rate 23, height 5\' 1"  (1.549 m), weight 81.602 kg (179 lb 14.4 oz), SpO2 85 %.    General appearance: alert and cooperative Resp: rales bibasilar Cardio: regular  rate and rhythm Extremities: edema 2+ edema Neurologic: Grossly normal  TELEMETRY: Reviewed telemetry pt in nsr:  ASSESSMENT AND PLAN:  Principal Problem:   Acute on chronic respiratory failure with hypoxia (HCC) Active Problems:   Acute on chronic respiratory failure with hypercapnia (HCC)   Pressure ulcer  Respiratory symtpms are likely multifactoral including diastolic heart failure nyha class 3-4, renal insuffiency and possible post pneumonia. Conitnue with iv lasix and follow renal function and symptoms .   Teodoro Spray., MD, [facility] 12/02/2014 10:49 AM

## 2014-12-02 NOTE — Progress Notes (Signed)
[facility] [place] NAME: Felicia Vincent    MR#:  [id number]  DATE OF BIRTH:  1945-07-13  SUBJECTIVE:  CHIEF COMPLAINT:   Chief Complaint  Patient presents with  . Shortness of Breath   some improvement, although still feeling short of breath. Good Urine output REVIEW OF SYSTEMS:  Review of Systems  Constitutional: Positive for malaise/fatigue. Negative for fever, weight loss and diaphoresis.  HENT: Negative for ear discharge, ear pain, hearing loss, nosebleeds, sore throat and tinnitus.   Eyes: Negative for blurred vision and pain.  Respiratory: Positive for shortness of breath. Negative for cough, hemoptysis and wheezing.   Cardiovascular: Negative for chest pain, palpitations, orthopnea and leg swelling.  Gastrointestinal: Negative for heartburn, nausea, vomiting, abdominal pain, diarrhea, constipation and blood in stool.  Genitourinary: Negative for dysuria, urgency and frequency.  Musculoskeletal: Negative for myalgias and back pain.  Skin: Negative for itching and rash.  Neurological: Positive for weakness. Negative for dizziness, tingling, tremors, focal weakness, seizures and headaches.  Psychiatric/Behavioral: Negative for depression. The patient is not nervous/anxious.    DRUG ALLERGIES:   Allergies  Allergen Reactions  . Codeine Nausea Only  . Prednisone Other (See Comments)    Pts daughter states that this medication "makes her evil".     VITALS:  Blood pressure 163/59, pulse 84, temperature 98.7 F (37.1 C), temperature source Oral, resp. rate 23, height 5\' 1"  (1.549 m), weight 81.602 kg (179 lb 14.4 oz), SpO2 85 %. PHYSICAL EXAMINATION:  Physical Exam  Constitutional: She is oriented to person, place, and time and well-developed, well-nourished, and in no distress.  HENT:  Head: Normocephalic and atraumatic.  Eyes: Conjunctivae and EOM are normal. Pupils are equal, round, and reactive to light.  Neck: Normal  range of motion. Neck supple. No tracheal deviation present. No thyromegaly present.  Cardiovascular: Normal rate, regular rhythm and normal heart sounds.   Pulmonary/Chest: Effort normal. No respiratory distress. She has no wheezes. She has rales. She exhibits no tenderness.  Abdominal: Soft. Bowel sounds are normal. She exhibits no distension. There is no tenderness.  Musculoskeletal: Normal range of motion.  Neurological: She is alert and oriented to person, place, and time. No cranial nerve deficit.  Skin: Skin is warm and dry. No rash noted.  Psychiatric: Mood and affect normal.   LABORATORY PANEL:   CBC  Recent Labs Lab 12/02/14 0622  WBC 10.0  HGB 8.3*  HCT 23.6*  PLT 167   ------------------------------------------------------------------------------------------------------------------ Chemistries   Recent Labs Lab 11/30/14 1655  12/02/14 0622  NA 134*  < > 134*  K 5.0  < > 4.5  CL 96*  < > 95*  CO2 29  < > 28  GLUCOSE 245*  < > 157*  BUN 37*  < > 40*  CREATININE 3.18*  < > 3.25*  CALCIUM 8.7*  < > 8.6*  AST 12*  --   --   ALT 12*  --   --   ALKPHOS 81  --   --   BILITOT 0.6  --   --   < > = values in this interval not displayed. RADIOLOGY:  US Venous Img Lower Bilateral  12/01/2014   CLINICAL DATA:  Lower extremity edema.  EXAM: BILATERAL LOWER EXTREMITY VENOUS DOPPLER ULTRASOUND  TECHNIQUE: Gray-scale sonography with graded compression, as well as color Doppler and duplex ultrasound were performed to evaluate the lower extremity deep venous systems from the level of the common femoral  vein and including the common femoral, femoral, profunda femoral, popliteal and calf veins including the posterior tibial, peroneal and gastrocnemius veins when visible. The superficial great saphenous vein was also interrogated. Spectral Doppler was utilized to evaluate flow at rest and with distal augmentation maneuvers in the common femoral, femoral and popliteal veins.   COMPARISON:  Prior left lower extremity venous duplex ultrasound on 02/23/2014.  FINDINGS: RIGHT LOWER EXTREMITY  Common Femoral Vein: No evidence of thrombus. Normal compressibility, respiratory phasicity and response to augmentation.  Saphenofemoral Junction: No evidence of thrombus. Normal compressibility and flow on color Doppler imaging.  Profunda Femoral Vein: No evidence of thrombus. Normal compressibility and flow on color Doppler imaging.  Femoral Vein: No evidence of thrombus. Normal compressibility, respiratory phasicity and response to augmentation.  Popliteal Vein: No evidence of thrombus. Normal compressibility, respiratory phasicity and response to augmentation.  Calf Veins: No evidence of thrombus. Normal compressibility and flow on color Doppler imaging.  Superficial Great Saphenous Vein: No evidence of thrombus. Normal compressibility and flow on color Doppler imaging.  Venous Reflux:  None.  Other Findings: No evidence of superficial thrombophlebitis or abnormal fluid collection.  LEFT LOWER EXTREMITY  Common Femoral Vein: No evidence of thrombus. Normal compressibility, respiratory phasicity and response to augmentation.  Saphenofemoral Junction: No evidence of thrombus. Normal compressibility and flow on color Doppler imaging.  Profunda Femoral Vein: No evidence of thrombus. Normal compressibility and flow on color Doppler imaging.  Femoral Vein: No evidence of thrombus. Normal compressibility, respiratory phasicity and response to augmentation.  Popliteal Vein: No evidence of thrombus. Normal compressibility, respiratory phasicity and response to augmentation.  Calf Veins: No evidence of thrombus. Normal compressibility and flow on color Doppler imaging.  Superficial Great Saphenous Vein: No evidence of thrombus. Normal compressibility and flow on color Doppler imaging.  Venous Reflux:  None.  Other Findings: No evidence of superficial thrombophlebitis or abnormal fluid collection.  IMPRESSION:  No evidence of bilateral lower extremity deep venous thrombosis.   Electronically Signed   By: Aletta Edouard M.D.   On: 12/01/2014 17:06   Dg Chest Port 1 View  12/02/2014   CLINICAL DATA:  Respiratory failure.  Pulmonary edema.  EXAM: PORTABLE CHEST 1 VIEW  COMPARISON:  11/30/2014 chest radiograph  FINDINGS: Stable cardiomediastinal silhouette with mild cardiomegaly. No pneumothorax. No pleural effusion. There is severe patchy fluffy airspace disease throughout both lungs, slightly worsened in the right upper lung. Stable small bilateral pleural effusions.  IMPRESSION: 1. Stable mild cardiomegaly. 2. Severe patchy fluffy airspace disease throughout both lungs, slightly worsened on the right upper lung. Differential includes severe pulmonary edema or severe infectious or inflammatory pneumonia. 3. Stable small bilateral pleural effusions.   Electronically Signed   By: Ilona Sorrel M.D.   On: 12/02/2014 10:00   ASSESSMENT AND PLAN:  * Acute on chronic respiratory failure with hypoxia - improving with diuresis At baseline 2 L oxygen nasal cannula at home. Secondary to CHF exacerbation  CT scan of the chest without contrast showed b/l pl effusions. Doppler studies for lower extremities - to r/o DVTs.  * Acute diastolic congestive heart failure with pulmonary hypertension Continue IV Lasix twice a day. Monitor creatinine. Appreciate cardiology input. Daily Input output. Check daily weights.  * Recent pneumonia Currently there is no need for antibiotics. Afebrile.  * Hypertension Continue amlodipine and hydralazine furosemide.  * Diabetes Continue glimepiride and Levemir, on insulin sliding scale coverage.  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  She will likely need dialysis soon. Nephrology following patient. Will need to consider dialysis access as outpatient.   All the records are reviewed and case discussed with Care  Management/Social Worker. Management plans discussed with the patient, family and they are in agreement.  CODE STATUS: Full Code  TOTAL TIME TAKING CARE OF THIS PATIENT: 35 minutes.    POSSIBLE D/C IN 1-2 DAYS, DEPENDING ON CLINICAL CONDITION.   Hillary Bow R M.D on 12/02/2014 at 11:32 AM  Between 7am to 6pm - Pager - [phone]  After 6pm go to [email or web address] - password [email or web address] [facility]  Office  [phone]  CC:  Primary care physician; Lavera Guise, MD

## 2014-12-02 NOTE — Progress Notes (Signed)
Subjective:   Daughter at bedside.  UOP 3350 Furosemide 80mg  IV Creatinine 3.25 (2.94)  Objective:  Vital signs in last 24 hours:  Temp:  [98.5 F (36.9 C)-98.8 F (37.1 C)] 98.7 F (37.1 C) (10/08 0518) Pulse Rate:  [81-85] 84 (10/08 0931) Resp:  [18-23] 23 (10/08 0518) BP: (159-169)/(59-63) 163/59 mmHg (10/08 0931) SpO2:  [85 %-90 %] 85 % (10/08 0735)  Weight change:  Filed Weights   11/30/14 1640 12/01/14 0413  Weight: 86.183 kg (190 lb) 81.602 kg (179 lb 14.4 oz)    Intake/Output:    Intake/Output Summary (Last 24 hours) at 12/02/14 0953 Last data filed at 12/02/14 0500  Gross per 24 hour  Intake 615.17 ml  Output   3350 ml  Net -2734.83 ml     Physical Exam: General: frail, elderly, in mild respiratory distress  HEENT [place]/AT EOMI hearing intact OM moist  Neck supple  Pulm/lungs Bilateral diffuse crackles  CVS/Heart irregular rhythm, no rubs or gallop  Abdomen:  soft, nontender, BS present  Extremities: Trace lower extremity edema  Neurologic: Awake, alert, sitting up, follows commands  Skin: No acute rashes           Basic Metabolic Panel:   Recent Labs Lab 11/30/14 1655 12/01/14 0354 12/02/14 0622  NA 134* 137 134*  K 5.0 4.6 4.5  CL 96* 103 95*  CO2 29 28 28   GLUCOSE 245* 61* 157*  BUN 37* 36* 40*  CREATININE 3.18* 2.94* 3.25*  CALCIUM 8.7* 8.6* 8.6*     CBC:  Recent Labs Lab 11/30/14 1655 12/01/14 0354 12/02/14 0622  WBC 7.2 8.2 10.0  HGB 8.2* 7.6* 8.3*  HCT 24.2* 22.7* 23.6*  MCV 90.4 84.8 89.3  PLT 160 147* 167      Microbiology:  Recent Results (from the past 720 hour(s))  Culture, blood (routine x 2)     Status: None   Collection Time: 11/07/14 12:30 PM  Result Value Ref Range Status   Specimen Description BLOOD RIGHT ASSIST CONTROL  Final   Special Requests   Final    BOTTLES DRAWN AEROBIC AND ANAEROBIC  2CC AERO 1 CC ANAERO   Culture NO GROWTH 5 DAYS  Final   Report Status 11/12/2014 FINAL  Final  Culture,  blood (routine x 2)     Status: None   Collection Time: 11/07/14 12:48 PM  Result Value Ref Range Status   Specimen Description BLOOD LEFT FATTY CASTS  Final   Special Requests BOTTLES DRAWN AEROBIC AND ANAEROBIC  1CC  Final   Culture NO GROWTH 5 DAYS  Final   Report Status 11/12/2014 FINAL  Final  MRSA PCR Screening     Status: None   Collection Time: 11/07/14  6:06 PM  Result Value Ref Range Status   MRSA by PCR NEGATIVE NEGATIVE Final    Comment:        The GeneXpert MRSA Assay (FDA approved for NASAL specimens only), is one component of a comprehensive MRSA colonization surveillance program. It is not intended to diagnose MRSA infection nor to guide or monitor treatment for MRSA infections.   C difficile quick scan w PCR reflex     Status: None   Collection Time: 11/08/14  4:15 AM  Result Value Ref Range Status   C Diff antigen NEGATIVE NEGATIVE Final   C Diff toxin NEGATIVE NEGATIVE Final   C Diff interpretation Negative for C. difficile  Final  Blood culture (routine x 2)     Status: None (  Preliminary result)   Collection Time: 11/30/14  6:04 PM  Result Value Ref Range Status   Specimen Description BLOOD RIGHT AC  Final   Special Requests BOTTLES DRAWN AEROBIC AND ANAEROBIC  6CC  Final   Culture NO GROWTH < 12 HOURS  Final   Report Status PENDING  Incomplete  Blood culture (routine x 2)     Status: None (Preliminary result)   Collection Time: 11/30/14  6:11 PM  Result Value Ref Range Status   Specimen Description BLOOD LEFT AC  Final   Special Requests BOTTLES DRAWN AEROBIC AND ANAEROBIC  1CC  Final   Culture NO GROWTH < 12 HOURS  Final   Report Status PENDING  Incomplete    Coagulation Studies: No results for input(s): LABPROT, INR in the last 72 hours.  Urinalysis: No results for input(s): COLORURINE, LABSPEC, PHURINE, GLUCOSEU, HGBUR, BILIRUBINUR, KETONESUR, PROTEINUR, UROBILINOGEN, NITRITE, LEUKOCYTESUR in the last 72 hours.  Invalid input(s): APPERANCEUR     Imaging: Dg Chest 2 View  11/30/2014   CLINICAL DATA:  Short of breath  EXAM: CHEST  2 VIEW  COMPARISON:  11/16/2014  FINDINGS: Lungs are under aerated. Heterogeneous opacities throughout both lungs are worrisome for patchy bilateral extensive airspace disease. Heart remains moderately enlarged. No pneumothorax.  IMPRESSION: Extensive bilateral airspace disease.   Electronically Signed   By: Marybelle Killings M.D.   On: 11/30/2014 17:32   Ct Chest Wo Contrast  11/30/2014   CLINICAL DATA:  Respiratory failure and pneumonia  EXAM: CT CHEST WITHOUT CONTRAST  TECHNIQUE: Multidetector CT imaging of the chest was performed following the standard protocol without IV contrast.  COMPARISON:  None.  FINDINGS: 1.5 cm precarinal lymph node. 12 mm short axis diameter right peritracheal lymph node. 18 mm subcarinal node.  Dense calcification in the left lobe of the thyroid gland.  Atherosclerotic calcifications in the mediastinum including 3 vessel coronary artery calcification.  Moderate bilateral pleural effusions right greater than left.  Ground-glass and confluent pulmonary opacities are seen throughout both lungs associated with the areas of interlobular septal thickening.  No vertebral compression deformity.  IMPRESSION: Bilateral pleural effusions  Mediastinal adenopathy. Findings may be related to neoplasm, inflammatory disease, or pulmonary edema  Bilateral pulmonary opacities as described. Findings may represent pulmonary edema or an inflammatory process. If the findings are chronic, interstitial lung disease or chronic organizing pneumonia are considerations.   Electronically Signed   By: Marybelle Killings M.D.   On: 11/30/2014 21:13   US Venous Img Lower Bilateral  12/01/2014   CLINICAL DATA:  Lower extremity edema.  EXAM: BILATERAL LOWER EXTREMITY VENOUS DOPPLER ULTRASOUND  TECHNIQUE: Gray-scale sonography with graded compression, as well as color Doppler and duplex ultrasound were performed to evaluate the lower  extremity deep venous systems from the level of the common femoral vein and including the common femoral, femoral, profunda femoral, popliteal and calf veins including the posterior tibial, peroneal and gastrocnemius veins when visible. The superficial great saphenous vein was also interrogated. Spectral Doppler was utilized to evaluate flow at rest and with distal augmentation maneuvers in the common femoral, femoral and popliteal veins.  COMPARISON:  Prior left lower extremity venous duplex ultrasound on 02/23/2014.  FINDINGS: RIGHT LOWER EXTREMITY  Common Femoral Vein: No evidence of thrombus. Normal compressibility, respiratory phasicity and response to augmentation.  Saphenofemoral Junction: No evidence of thrombus. Normal compressibility and flow on color Doppler imaging.  Profunda Femoral Vein: No evidence of thrombus. Normal compressibility and flow on color Doppler imaging.  Femoral Vein: No evidence of thrombus. Normal compressibility, respiratory phasicity and response to augmentation.  Popliteal Vein: No evidence of thrombus. Normal compressibility, respiratory phasicity and response to augmentation.  Calf Veins: No evidence of thrombus. Normal compressibility and flow on color Doppler imaging.  Superficial Great Saphenous Vein: No evidence of thrombus. Normal compressibility and flow on color Doppler imaging.  Venous Reflux:  None.  Other Findings: No evidence of superficial thrombophlebitis or abnormal fluid collection.  LEFT LOWER EXTREMITY  Common Femoral Vein: No evidence of thrombus. Normal compressibility, respiratory phasicity and response to augmentation.  Saphenofemoral Junction: No evidence of thrombus. Normal compressibility and flow on color Doppler imaging.  Profunda Femoral Vein: No evidence of thrombus. Normal compressibility and flow on color Doppler imaging.  Femoral Vein: No evidence of thrombus. Normal compressibility, respiratory phasicity and response to augmentation.  Popliteal  Vein: No evidence of thrombus. Normal compressibility, respiratory phasicity and response to augmentation.  Calf Veins: No evidence of thrombus. Normal compressibility and flow on color Doppler imaging.  Superficial Great Saphenous Vein: No evidence of thrombus. Normal compressibility and flow on color Doppler imaging.  Venous Reflux:  None.  Other Findings: No evidence of superficial thrombophlebitis or abnormal fluid collection.  IMPRESSION: No evidence of bilateral lower extremity deep venous thrombosis.   Electronically Signed   By: Aletta Edouard M.D.   On: 12/01/2014 17:06     Medications:     . amLODipine  5 mg Oral QHS  . aspirin EC  81 mg Oral Daily  . atorvastatin  10 mg Oral QHS  . budesonide  0.25 mg Nebulization BID  . citalopram  40 mg Oral QPM  . clopidogrel  75 mg Oral QHS  . donepezil  5 mg Oral Daily  . furosemide  80 mg Intravenous Q12H  . glimepiride  2 mg Oral BID  . guaiFENesin  600 mg Oral BID  . heparin  5,000 Units Subcutaneous 3 times per day  . hydrALAZINE  50 mg Oral TID  . insulin aspart  0-5 Units Subcutaneous QHS  . insulin aspart  0-9 Units Subcutaneous TID WC  . insulin detemir  10 Units Subcutaneous QHS  . ipratropium-albuterol  3 mL Nebulization Q6H  . isosorbide mononitrate  90 mg Oral BID  . Liraglutide  1.8 mg Subcutaneous Daily  . metoprolol tartrate  25 mg Oral BID  . polycarbophil  625 mg Oral Daily  . sodium chloride  3 mL Intravenous Q12H  . vitamin B-12  1,000 mcg Oral Daily   benzonatate, guaiFENesin-dextromethorphan, ondansetron, sodium chloride  Assessment/ Plan:  69 y.o. female Felicia Vincent is a 69 y.o. white female with diabetic kidney disease, hypertension, diabetic retinopathy, bilateral cataracts, history of TIA and chronic kidney disease stage V admitted on 11/30/2014 for Edema [R60.9] Dyspnea [R06.00]  1. 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 Patient will need dialysis in the  future. However now with volume overload and pulmonary edema.  Chronic Kidney Disease is thought to be due to diabetic nephropathy.  - Continue to monitor urine output, electrolytes and renal function.  - Currently without access.  - renally dose medications.  - Currently not on an ACE-I/ARB. Previously on losartan  2. 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   3. Hypertension: elevated. Volume driven.  - started on metoprolol  by cardiology.  - amlodipine, hydralazine, and imdur.  - ACE-I/ARB as above.    LOS: 2 Felicia Vincent 10/8/20169:53 AM

## 2014-12-02 NOTE — Consult Note (Signed)
[facility] Consultation      Date: 12/02/2014,   MRN# [id number] Jennesis Ramaswamy Cawood [birth year] Code Status:     Code Status Orders        Start     Ordered   11/30/14 2130  Full code   Continuous     11/30/14 2129     Hosp day:@LENGTHOFSTAYDAYS @ Referring MD: @ATDPROV @     PCP:      AdmissionWeight: 190 lb (86.183 kg)                 CurrentWeight: 179 lb 14.4 oz (81.602 kg) (verified by victoria) Johann Capers is a 69 y.o. old female seen in consultation for hypoxia     CHIEF COMPLAINT:   SOb and hypoxia at home. DOE   HISTORY OF PRESENT ILLNESS  Patient feels better today, aggressive diuresis by Nephrology On minimal oxygen therapy-will likely  need oxygen upon discharge    PAST MEDICAL HISTORY   Past Medical History  Diagnosis Date  . Hypertension   . Diabetes mellitus without complication ([place])   . TIA (transient ischemic attack)   . Cataract   . Stroke ([place])   . Dizziness   . Anemia   . Pneumonia      SURGICAL HISTORY   Past Surgical History  Procedure Laterality Date  . Cholecystectomy    . Lithotripsy    . Abdominal hysterectomy    . Breast surgery       FAMILY HISTORY   Family History  Problem Relation Age of Onset  . Diabetes Mother      SOCIAL HISTORY   Social History  Substance Use Topics  . Smoking status: Never Smoker   . Smokeless tobacco: Never Used  . Alcohol Use: No     MEDICATIONS    Home Medication:  No current outpatient prescriptions on file.  Current Medication:  Current facility-administered medications:  .  amLODipine (NORVASC) tablet 5 mg, 5 mg, Oral, QHS, Vaughan Basta, MD, 5 mg at 12/01/14 2143 .  aspirin EC tablet 81 mg, 81 mg, Oral, Daily, Vaughan Basta, MD, 81 mg at 12/02/14 0932 .  atorvastatin (LIPITOR) tablet 10 mg, 10 mg, Oral, QHS, Vaughan Basta, MD, 10 mg at 12/01/14 2142 .  benzonatate (TESSALON) capsule 200 mg, 200 mg, Oral, TID PRN,  Vaughan Basta, MD, 200 mg at 12/01/14 0608 .  budesonide (PULMICORT) nebulizer solution 0.25 mg, 0.25 mg, Nebulization, BID, Vaughan Basta, MD, 0.25 mg at 12/02/14 0734 .  citalopram (CELEXA) tablet 40 mg, 40 mg, Oral, QPM, Vaughan Basta, MD, 40 mg at 12/01/14 1720 .  clopidogrel (PLAVIX) tablet 75 mg, 75 mg, Oral, QHS, Vaughan Basta, MD, 75 mg at 12/01/14 2143 .  donepezil (ARICEPT) tablet 5 mg, 5 mg, Oral, Daily, Vaughan Basta, MD, 5 mg at 12/02/14 0933 .  furosemide (LASIX) injection 80 mg, 80 mg, Intravenous, Q12H, Lavonia Dana, MD, 80 mg at 12/02/14 0932 .  glimepiride (AMARYL) tablet 2 mg, 2 mg, Oral, BID, Vaughan Basta, MD, 2 mg at 12/02/14 0932 .  guaiFENesin (MUCINEX) 12 hr tablet 600 mg, 600 mg, Oral, BID, Vaughan Basta, MD, 600 mg at 12/02/14 0932 .  guaiFENesin-dextromethorphan (ROBITUSSIN DM) 100-10 MG/5ML syrup 10 mL, 10 mL, Oral, Q6H PRN, Vaughan Basta, MD .  heparin injection 5,000 Units, 5,000 Units, Subcutaneous, 3 times per day, Vaughan Basta, MD, 5,000 Units at 12/02/14 0520 .  hydrALAZINE (APRESOLINE) tablet 50 mg, 50 mg, Oral, TID, Vaughan Basta, MD, 50 mg at  12/02/14 0932 .  insulin aspart (novoLOG) injection 0-5 Units, 0-5 Units, Subcutaneous, QHS, Max Sane, MD, 0 Units at 12/01/14 2200 .  insulin aspart (novoLOG) injection 0-9 Units, 0-9 Units, Subcutaneous, TID WC, Max Sane, MD, 1 Units at 12/02/14 0759 .  insulin detemir (LEVEMIR) injection 10 Units, 10 Units, Subcutaneous, QHS, Max Sane, MD, 10 Units at 12/01/14 2144 .  ipratropium-albuterol (DUONEB) 0.5-2.5 (3) MG/3ML nebulizer solution 3 mL, 3 mL, Nebulization, Q6H, Flora Lipps, MD, 3 mL at 12/02/14 0735 .  isosorbide mononitrate (IMDUR) 24 hr tablet 90 mg, 90 mg, Oral, BID, Vaughan Basta, MD, 90 mg at 12/02/14 0932 .  Liraglutide SOPN 1.8 mg, 1.8 mg, Subcutaneous, Daily, Vaughan Basta, MD, 1.8 mg at 12/02/14 0930 .   metoprolol tartrate (LOPRESSOR) tablet 25 mg, 25 mg, Oral, BID, Teodoro Spray, MD, 25 mg at 12/02/14 0932 .  ondansetron (ZOFRAN) tablet 4 mg, 4 mg, Oral, TID PRN, Vaughan Basta, MD .  polycarbophil (FIBERCON) tablet 625 mg, 625 mg, Oral, Daily, Vaughan Basta, MD, 625 mg at 12/02/14 0933 .  sodium chloride 0.9 % injection 3 mL, 3 mL, Intravenous, Q12H, Vaughan Basta, MD, 3 mL at 12/02/14 0933 .  sodium chloride 0.9 % injection 3 mL, 3 mL, Intravenous, PRN, Vaughan Basta, MD .  vitamin B-12 (CYANOCOBALAMIN) tablet 1,000 mcg, 1,000 mcg, Oral, Daily, Vaughan Basta, MD, 1,000 mcg at 12/02/14 0932    ALLERGIES   Codeine and Prednisone     REVIEW OF SYSTEMS   Review of Systems  Constitutional: Positive for malaise/fatigue. Negative for fever, chills, weight loss and diaphoresis.  HENT: Negative for congestion and hearing loss.   Eyes: Negative for blurred vision and double vision.  Respiratory: Positive for shortness of breath. Negative for cough, hemoptysis and wheezing.   Cardiovascular: Positive for leg swelling. Negative for chest pain, palpitations and orthopnea.  Gastrointestinal: Negative for heartburn, nausea and vomiting.  Genitourinary: Negative for dysuria and urgency.  Musculoskeletal: Negative for myalgias, back pain and neck pain.  Skin: Negative for rash.  Neurological: Positive for weakness. Negative for dizziness and headaches.  Psychiatric/Behavioral: The patient is not nervous/anxious.   All other systems reviewed and are negative.    VS: BP 167/56 mmHg  Pulse 82  Temp(Src) 98.2 F (36.8 C) (Oral)  Resp 18  Ht 5\' 1"  (1.549 m)  Wt 179 lb 14.4 oz (81.602 kg)  BMI 34.01 kg/m2  SpO2 88%     PHYSICAL EXAM  Physical Exam  Constitutional: She is oriented to person, place, and time. She appears well-developed and well-nourished. No distress.  HENT:  Head: Normocephalic and atraumatic.  Mouth/Throat: No oropharyngeal  exudate.  Eyes: EOM are normal. Pupils are equal, round, and reactive to light. No scleral icterus.  Neck: Normal range of motion. Neck supple.  Cardiovascular: Normal rate, regular rhythm and normal heart sounds.   No murmur heard. Pulmonary/Chest: No stridor. No respiratory distress. She has no wheezes. She has rales.  Abdominal: Soft. Bowel sounds are normal. She exhibits no distension. There is no tenderness. There is no rebound.  Musculoskeletal: Normal range of motion. She exhibits no edema.  Neurological: She is alert and oriented to person, place, and time. She displays normal reflexes. Coordination normal.  Skin: Skin is warm. She is not diaphoretic.  Psychiatric: She has a normal mood and affect.        LABS    Recent Labs     11/30/14  1655  12/01/14  0354  12/02/14  2458  HGB  8.2*  7.6*  8.3*  HCT  24.2*  22.7*  23.6*  MCV  90.4  84.8  89.3  WBC  7.2  8.2  10.0  BUN  37*  36*  40*  CREATININE  3.18*  2.94*  3.25*  GLUCOSE  245*  61*  157*  CALCIUM  8.7*  8.6*  8.6*  ,    No results for input(s): PH in the last 72 hours.  Invalid input(s): PCO2, PO2, BASEEXCESS, BASEDEFICITE, TFT    CULTURE RESULTS   Recent Results (from the past 240 hour(s))  Blood culture (routine x 2)     Status: None (Preliminary result)   Collection Time: 11/30/14  6:04 PM  Result Value Ref Range Status   Specimen Description BLOOD RIGHT AC  Final   Special Requests BOTTLES DRAWN AEROBIC AND ANAEROBIC  6CC  Final   Culture NO GROWTH 2 DAYS  Final   Report Status PENDING  Incomplete  Blood culture (routine x 2)     Status: None (Preliminary result)   Collection Time: 11/30/14  6:11 PM  Result Value Ref Range Status   Specimen Description BLOOD LEFT AC  Final   Special Requests BOTTLES DRAWN AEROBIC AND ANAEROBIC  1CC  Final   Culture NO GROWTH 2 DAYS  Final   Report Status PENDING  Incomplete          IMAGING    Dg Chest 1 View  11/16/2014   CLINICAL DATA:  Follow-up  pneumonia  EXAM: CHEST 1 VIEW  COMPARISON:  11/14/2014  FINDINGS: Cardiomediastinal silhouette is stable. Right IJ catheter with tip in right atrium. No pulmonary edema. Again noted patchy bilateral airspace disease without change in aeration  IMPRESSION: No pulmonary edema. Right IJ catheter with tip in right atrium. Again noted patchy bilateral airspace disease without change in aeration.   Electronically Signed   By: Lahoma Crocker M.D.   On: 11/16/2014 10:49   Dg Chest 1 View  11/14/2014   CLINICAL DATA:  Dyspnea.  EXAM: CHEST  1 VIEW  COMPARISON:  11/13/2014 and 11/10/2014  FINDINGS: Right IJ central venous catheter with tip just below the cavoatrial junction without significant change. Interval removal of nasogastric tube and endotracheal tube.  Lungs are adequately inflated demonstrate persistent bilateral patchy airspace opacification which may be slightly worse over the right upper lobe compared to 11/13/2014, although greatly improved compared to 11/10/2014. No evidence of effusion. Mild stable cardiomegaly. Remainder of the exam is unchanged.  IMPRESSION: Patchy bilateral airspace process with marked improvement compared to 11/10/2014.  Stable cardiomegaly.  Right IJ central venous catheter with tip just below the cavoatrial junction.   Electronically Signed   By: Marin Olp M.D.   On: 11/14/2014 08:13   Dg Chest 2 View  11/30/2014   CLINICAL DATA:  Short of breath  EXAM: CHEST  2 VIEW  COMPARISON:  11/16/2014  FINDINGS: Lungs are under aerated. Heterogeneous opacities throughout both lungs are worrisome for patchy bilateral extensive airspace disease. Heart remains moderately enlarged. No pneumothorax.  IMPRESSION: Extensive bilateral airspace disease.   Electronically Signed   By: Marybelle Killings M.D.   On: 11/30/2014 17:32   Dg Abd 1 View  11/10/2014   CLINICAL DATA:  Encounter for intubation Z01.818 (ICD-10-CM) Encounter for orogastric (OG) tube placement Z46.59 (ICD-10-CM)  EXAM: ABDOMEN - 1  VIEW  COMPARISON:  None.  FINDINGS: Orogastric tube passes well below the diaphragm into the distal stomach, projecting in the right mid  abdomen.  There is a paucity of bowel gas.  Vascular calcifications are noted.  Lung bases irregular interstitial airspace opacities.  IMPRESSION: Orogastric tube tip projects in the distal stomach.   Electronically Signed   By: Lajean Manes M.D.   On: 11/10/2014 20:29   Ct Chest Wo Contrast  11/30/2014   CLINICAL DATA:  Respiratory failure and pneumonia  EXAM: CT CHEST WITHOUT CONTRAST  TECHNIQUE: Multidetector CT imaging of the chest was performed following the standard protocol without IV contrast.  COMPARISON:  None.  FINDINGS: 1.5 cm precarinal lymph node. 12 mm short axis diameter right peritracheal lymph node. 18 mm subcarinal node.  Dense calcification in the left lobe of the thyroid gland.  Atherosclerotic calcifications in the mediastinum including 3 vessel coronary artery calcification.  Moderate bilateral pleural effusions right greater than left.  Ground-glass and confluent pulmonary opacities are seen throughout both lungs associated with the areas of interlobular septal thickening.  No vertebral compression deformity.  IMPRESSION: Bilateral pleural effusions  Mediastinal adenopathy. Findings may be related to neoplasm, inflammatory disease, or pulmonary edema  Bilateral pulmonary opacities as described. Findings may represent pulmonary edema or an inflammatory process. If the findings are chronic, interstitial lung disease or chronic organizing pneumonia are considerations.   Electronically Signed   By: Marybelle Killings M.D.   On: 11/30/2014 21:13   US Venous Img Lower Bilateral  12/01/2014   CLINICAL DATA:  Lower extremity edema.  EXAM: BILATERAL LOWER EXTREMITY VENOUS DOPPLER ULTRASOUND  TECHNIQUE: Gray-scale sonography with graded compression, as well as color Doppler and duplex ultrasound were performed to evaluate the lower extremity deep venous systems from  the level of the common femoral vein and including the common femoral, femoral, profunda femoral, popliteal and calf veins including the posterior tibial, peroneal and gastrocnemius veins when visible. The superficial great saphenous vein was also interrogated. Spectral Doppler was utilized to evaluate flow at rest and with distal augmentation maneuvers in the common femoral, femoral and popliteal veins.  COMPARISON:  Prior left lower extremity venous duplex ultrasound on 02/23/2014.  FINDINGS: RIGHT LOWER EXTREMITY  Common Femoral Vein: No evidence of thrombus. Normal compressibility, respiratory phasicity and response to augmentation.  Saphenofemoral Junction: No evidence of thrombus. Normal compressibility and flow on color Doppler imaging.  Profunda Femoral Vein: No evidence of thrombus. Normal compressibility and flow on color Doppler imaging.  Femoral Vein: No evidence of thrombus. Normal compressibility, respiratory phasicity and response to augmentation.  Popliteal Vein: No evidence of thrombus. Normal compressibility, respiratory phasicity and response to augmentation.  Calf Veins: No evidence of thrombus. Normal compressibility and flow on color Doppler imaging.  Superficial Great Saphenous Vein: No evidence of thrombus. Normal compressibility and flow on color Doppler imaging.  Venous Reflux:  None.  Other Findings: No evidence of superficial thrombophlebitis or abnormal fluid collection.  LEFT LOWER EXTREMITY  Common Femoral Vein: No evidence of thrombus. Normal compressibility, respiratory phasicity and response to augmentation.  Saphenofemoral Junction: No evidence of thrombus. Normal compressibility and flow on color Doppler imaging.  Profunda Femoral Vein: No evidence of thrombus. Normal compressibility and flow on color Doppler imaging.  Femoral Vein: No evidence of thrombus. Normal compressibility, respiratory phasicity and response to augmentation.  Popliteal Vein: No evidence of thrombus. Normal  compressibility, respiratory phasicity and response to augmentation.  Calf Veins: No evidence of thrombus. Normal compressibility and flow on color Doppler imaging.  Superficial Great Saphenous Vein: No evidence of thrombus. Normal compressibility and flow on color Doppler imaging.  Venous Reflux:  None.  Other Findings: No evidence of superficial thrombophlebitis or abnormal fluid collection.  IMPRESSION: No evidence of bilateral lower extremity deep venous thrombosis.   Electronically Signed   By: Aletta Edouard M.D.   On: 12/01/2014 17:06   Dg Chest Port 1 View  12/02/2014   CLINICAL DATA:  Respiratory failure.  Pulmonary edema.  EXAM: PORTABLE CHEST 1 VIEW  COMPARISON:  11/30/2014 chest radiograph  FINDINGS: Stable cardiomediastinal silhouette with mild cardiomegaly. No pneumothorax. No pleural effusion. There is severe patchy fluffy airspace disease throughout both lungs, slightly worsened in the right upper lung. Stable small bilateral pleural effusions.  IMPRESSION: 1. Stable mild cardiomegaly. 2. Severe patchy fluffy airspace disease throughout both lungs, slightly worsened on the right upper lung. Differential includes severe pulmonary edema or severe infectious or inflammatory pneumonia. 3. Stable small bilateral pleural effusions.   Electronically Signed   By: Ilona Sorrel M.D.   On: 12/02/2014 10:00   Dg Chest Port 1 View  11/13/2014   CLINICAL DATA:  Followup acute hypoxic respiratory failure secondary to bilateral pneumonia. Cough. Now possible CHF.  EXAM: PORTABLE CHEST - 1 VIEW  COMPARISON:  11/11/2014  FINDINGS: Support devices remain in stable position. Improving aeration of the lungs with decreasing bilateral airspace disease. Cardiomegaly. No visible effusions. No acute bony abnormality.  IMPRESSION: Bilateral airspace opacities have improved, likely improving edema and/or pneumonia.   Electronically Signed   By: Rolm Baptise M.D.   On: 11/13/2014 08:57   Dg Chest Port 1  View  11/11/2014   CLINICAL DATA:  Respiratory failure, ventilatory support  EXAM: PORTABLE CHEST - 1 VIEW  COMPARISON:  11/10/2014  FINDINGS: Endotracheal tube 1.6 cm above the carina. NG tube extends into the stomach with the tip not visualized. Right IJ central line tip proximal right atrium as before. Severe patchy bilateral airspace process throughout both lungs with some sparing of the left upper lobe compatible with pneumonia versus alveolar edema or hemorrhage. No enlarging effusion or pneumothorax. No significant interval change.  IMPRESSION: Stable support apparatus  Persistent asymmetric bilateral airspace disease compatible with pneumonia. Little interval change.   Electronically Signed   By: Jerilynn Mages.  Shick M.D.   On: 11/11/2014 09:31   Dg Chest Port 1 View  11/10/2014   CLINICAL DATA:  Intubated  EXAM: PORTABLE CHEST - 1 VIEW  COMPARISON:  11/10/2014  FINDINGS: Cardiomediastinal silhouette is stable. Bilateral airspace opacities are again noted without change in aeration. Endotracheal tube in place with tip 2.8 cm above the carina. NG tube in place with tip in proximal stomach. There is no pneumothorax. There is right IJ central line with tip in SVC right atrium junction.  IMPRESSION: Endotracheal and NG tube in place. Again noted bilateral airspace opacities without change in aeration. No pneumothorax.   Electronically Signed   By: Lahoma Crocker M.D.   On: 11/10/2014 20:20   Dg Chest Port 1 View  11/10/2014   CLINICAL DATA:  Evaluate pneumonia.  EXAM: PORTABLE CHEST - 1 VIEW  COMPARISON:  11/09/2014  FINDINGS: The normal heart size. Aortic atherosclerosis identified. Persistent bilateral pulmonary opacities identified. There is slightly improved aeration to the right upper lobe with worsening aeration to the right lower lobe.  IMPRESSION: 1. Persistent bilateral airspace opacities with slightly improved aeration to the right upper lobe.   Electronically Signed   By: Kerby Moors M.D.   On: 11/10/2014  09:31   Dg Chest Port 1 View  11/09/2014   CLINICAL  DATA:  Respiratory failure.  EXAM: PORTABLE CHEST - 1 VIEW  COMPARISON:  11/07/2014 .  FINDINGS: Mediastinum is stable. Stable cardiomegaly. Persistent multifocal pulmonary infiltrates particularly prominent in the right upper lobe again noted. Partial atelectasis of the right upper lobe noted. Pleural fluid in the right major fissure cannot be excluded. No pneumothorax. No acute osseus abnormality .  IMPRESSION: 1. Persistent multifocal bilateral pulmonary infiltrates, particular prominent in the right upper lobe. Partial atelectasis of the right upper lobe present . Findings are most consistent with multifocal pneumonia. Small amount of pleural fluid in the right major fissure cannot be excluded.  2.  Stable cardiomegaly.   Electronically Signed   By: Marcello Moores  Register   On: 11/09/2014 08:05   Dg Chest Port 1 View  11/07/2014   CLINICAL DATA:  Dyspnea.  EXAM: PORTABLE CHEST - 1 VIEW  COMPARISON:  October 31, 2014  FINDINGS: The heart size and mediastinal contours are stable. The heart size is enlarged. There is diffuse consolidation throughout the right lung. Patchy consolidation of the left mid and lung base are identified. The visualized skeletal structures are unremarkable.  IMPRESSION: Pneumonias in bilateral lungs.   Electronically Signed   By: Abelardo Diesel M.D.   On: 11/07/2014 13:29        ASSESSMENT/PLAN   69 yo white female admitted for acute Worsening of SOB from acute diastolic heart failure with renal insufficiency  1.oxygen as needed 2.patient REFUSES bipap 3.lasix  as tolerated 4.no indication for Bronch/abx or steroids at this time  I have no other recommendations at this time. Will sign off at this time. Please call with any questions    I have personally obtained a history, examined the patient, evaluated laboratory and independently reviewed imaging results, formulated the assessment and plan and placed orders.  The  Patient requires high complexity decision making for assessment and support, frequent evaluation and titration of therapies, application of advanced monitoring technologies and extensive interpretation of multiple databases.   Patient satisfied with Plan of action and management. All questions answered  Corrin Parker, M.D.  [facility]  Medical Director [place] Director [facility] Cardio-Pulmonary Department

## 2014-12-02 NOTE — Consult Note (Signed)
Patient placed back on biPAP, patient with worsening CXR c/w edema.  Recommend lasix infusion to be started again. Will follow along  1)continue biPAP 2)start dounebs 3)recommend lasix infusion, patient may need dialysis  Case discussed with Dr. Darvin Neighbours

## 2014-12-02 NOTE — Progress Notes (Signed)
[place]  SUBJECTIVE: still sob but feels better than last night   Filed Vitals:   12/01/14 1946 12/02/14 0518 12/02/14 0735 12/02/14 0931  BP: 166/59 169/63  163/59  Pulse: 84 81  84  Temp: 98.5 F (36.9 C) 98.7 F (37.1 C)    TempSrc: Oral Oral    Resp: 19 23    Height:      Weight:      SpO2: 89% 90% 85%     Intake/Output Summary (Last 24 hours) at 12/02/14 1056 Last data filed at 12/02/14 0830  Gross per 24 hour  Intake    720 ml  Output   2800 ml  Net  -2080 ml    LABS: Basic Metabolic Panel:  Recent Labs  12/01/14 0354 12/02/14 0622  NA 137 134*  K 4.6 4.5  CL 103 95*  CO2 28 28  GLUCOSE 61* 157*  BUN 36* 40*  CREATININE 2.94* 3.25*  CALCIUM 8.6* 8.6*   Liver Function Tests:  Recent Labs  11/30/14 1655  AST 12*  ALT 12*  ALKPHOS 81  BILITOT 0.6  PROT 6.9  ALBUMIN 2.4*   No results for input(s): LIPASE, AMYLASE in the last 72 hours. CBC:  Recent Labs  12/01/14 0354 12/02/14 0622  WBC 8.2 10.0  HGB 7.6* 8.3*  HCT 22.7* 23.6*  MCV 84.8 89.3  PLT 147* 167   Cardiac Enzymes:  Recent Labs  11/30/14 1655  TROPONINI <0.03   BNP: Invalid input(s): POCBNP D-Dimer: No results for input(s): DDIMER in the last 72 hours. Hemoglobin A1C: No results for input(s): HGBA1C in the last 72 hours. Fasting Lipid Panel: No results for input(s): CHOL, HDL, LDLCALC, TRIG, CHOLHDL, LDLDIRECT in the last 72 hours. Thyroid Function Tests: No results for input(s): TSH, T4TOTAL, T3FREE, THYROIDAB in the last 72 hours.  Invalid input(s): FREET3 Anemia Panel: No results for input(s): VITAMINB12, FOLATE, FERRITIN, TIBC, IRON, RETICCTPCT in the last 72 hours.   Physical Exam: Blood pressure 163/59, pulse 84, temperature 98.7 F (37.1 C), temperature source Oral, resp. rate 23, height 5\' 1"  (1.549 m), weight 81.602 kg (179 lb 14.4 oz), SpO2 85 %.  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 WIll continue iv lasix and follow electrolytes and exam and cxr. wILL CONTINUE METOPROLOL  Active Problems:   Acute on chronic respiratory failure with hypercapnia (HCC)   Pressure ulcer    Teodoro Spray., MD, [facility] 12/02/2014 10:56 AM

## 2014-12-02 NOTE — Progress Notes (Signed)
Patient oxygen saturation 70's when checked on 4L, breathing tx given to patient and 15L nonrebreather placed on patient. Oxygen saturation increased to 96%. Dr. Darvin Neighbours notified and Bipap ordered. Bipap placed on patient by respiratory. Patient stable at this time. Dr. Darvin Neighbours gave telephone order for 80mg  lasix IV once, given to patient. Will cont to assess. Felicia Vincent

## 2014-12-02 NOTE — Progress Notes (Signed)
Paged by nurse regarding worsening breathing. Started on NRB.  On exam patient in resp distress. Using accessory muscled.  S1, S2 Lungs, poor entry, coarse No edema  * Acute on chronic respiratory failure- worsening Due to acute on chronic diastolic chf Recent echo EF 55% CXR reviewed Discussed with Dr. Mortimer Fries and Dr. Juleen China. Start Bipap. STAT dose lasix 80 mg IV. I/Os. Transfer to [employer]. Intubate for full vent support if any worsening.  Critically ill. Discussed with family  CC time spent 40 minutes

## 2014-12-03 LAB — BASIC METABOLIC PANEL
Anion gap: 11 (ref 5–15)
BUN: 43 mg/dL — AB (ref 6–20)
CHLORIDE: 94 mmol/L — AB (ref 101–111)
CO2: 29 mmol/L (ref 22–32)
CREATININE: 3.55 mg/dL — AB (ref 0.44–1.00)
Calcium: 8.4 mg/dL — ABNORMAL LOW (ref 8.9–10.3)
GFR calc Af Amer: 14 mL/min — ABNORMAL LOW (ref >60)
GFR calc non Af Amer: 12 mL/min — ABNORMAL LOW (ref >60)
Glucose, Bld: 232 mg/dL — ABNORMAL HIGH (ref 65–99)
Potassium: 4.4 mmol/L (ref 3.5–5.1)
Sodium: 134 mmol/L — ABNORMAL LOW (ref 135–145)

## 2014-12-03 LAB — GLUCOSE, CAPILLARY
GLUCOSE-CAPILLARY: 203 mg/dL — AB (ref 65–99)
GLUCOSE-CAPILLARY: 459 mg/dL — AB (ref 65–99)
GLUCOSE-CAPILLARY: 405 mg/dL — AB (ref 65–99)
Glucose-Capillary: 227 mg/dL — ABNORMAL HIGH (ref 65–99)

## 2014-12-03 MED ORDER — FUROSEMIDE 10 MG/ML IJ SOLN
80.0000 mg | Freq: Once | INTRAMUSCULAR | Status: AC
  Administered 2014-12-03: 80 mg via INTRAVENOUS
  Filled 2014-12-03: qty 8

## 2014-12-03 MED ORDER — FUROSEMIDE 10 MG/ML IJ SOLN
80.0000 mg | Freq: Three times a day (TID) | INTRAMUSCULAR | Status: DC
  Administered 2014-12-03 – 2014-12-04 (×5): 80 mg via INTRAVENOUS
  Filled 2014-12-03 (×5): qty 8

## 2014-12-03 NOTE — Progress Notes (Signed)
Subjective:   Daughter at bedside.  Transferred to CCU.  UOP 2570 (3350) Furosemide 80mg  IV q8 Creatinine 3.55 (3.25) (2.94)  Objective:  Vital signs in last 24 hours:  Temp:  [97.8 F (36.6 C)-98.4 F (36.9 C)] 98.4 F (36.9 C) (10/09 0800) Pulse Rate:  [72-83] 73 (10/09 0800) Resp:  [18-28] 22 (10/09 0800) BP: (121-174)/(49-69) 143/55 mmHg (10/09 0800) SpO2:  [88 %-98 %] 96 % (10/09 0800) FiO2 (%):  [50 %] 50 % (10/09 0740) Weight:  [80.5 kg (177 lb 7.5 oz)] 80.5 kg (177 lb 7.5 oz) (10/08 1624)  Weight change:  Filed Weights   11/30/14 1640 12/01/14 0413 12/02/14 1624  Weight: 86.183 kg (190 lb) 81.602 kg (179 lb 14.4 oz) 80.5 kg (177 lb 7.5 oz)    Intake/Output:    Intake/Output Summary (Last 24 hours) at 12/03/14 1116 Last data filed at 12/03/14 1030  Gross per 24 hour  Intake      6 ml  Output   2571 ml  Net  -2565 ml     Physical Exam: General: frail, elderly,   HEENT [place]/AT EOMI hearing intact OM moist  Neck supple  Pulm/lungs Bilateral diffuse crackles  CVS/Heart irregular rhythm, no rubs or gallop  Abdomen:  soft, nontender, BS present  Extremities: No lower extremity edema  Neurologic: Awake, alert, sitting up, follows commands  Skin: No acute rashes           Basic Metabolic Panel:   Recent Labs Lab 11/30/14 1655 12/01/14 0354 12/02/14 0622 12/03/14 0429  NA 134* 137 134* 134*  K 5.0 4.6 4.5 4.4  CL 96* 103 95* 94*  CO2 29 28 28 29   GLUCOSE 245* 61* 157* 232*  BUN 37* 36* 40* 43*  CREATININE 3.18* 2.94* 3.25* 3.55*  CALCIUM 8.7* 8.6* 8.6* 8.4*     CBC:  Recent Labs Lab 11/30/14 1655 12/01/14 0354 12/02/14 0622  WBC 7.2 8.2 10.0  HGB 8.2* 7.6* 8.3*  HCT 24.2* 22.7* 23.6*  MCV 90.4 84.8 89.3  PLT 160 147* 167      Microbiology:  Recent Results (from the past 720 hour(s))  Culture, blood (routine x 2)     Status: None   Collection Time: 11/07/14 12:30 PM  Result Value Ref Range Status   Specimen Description BLOOD  RIGHT ASSIST CONTROL  Final   Special Requests   Final    BOTTLES DRAWN AEROBIC AND ANAEROBIC  2CC AERO 1 CC ANAERO   Culture NO GROWTH 5 DAYS  Final   Report Status 11/12/2014 FINAL  Final  Culture, blood (routine x 2)     Status: None   Collection Time: 11/07/14 12:48 PM  Result Value Ref Range Status   Specimen Description BLOOD LEFT FATTY CASTS  Final   Special Requests BOTTLES DRAWN AEROBIC AND ANAEROBIC  1CC  Final   Culture NO GROWTH 5 DAYS  Final   Report Status 11/12/2014 FINAL  Final  MRSA PCR Screening     Status: None   Collection Time: 11/07/14  6:06 PM  Result Value Ref Range Status   MRSA by PCR NEGATIVE NEGATIVE Final    Comment:        The GeneXpert MRSA Assay (FDA approved for NASAL specimens only), is one component of a comprehensive MRSA colonization surveillance program. It is not intended to diagnose MRSA infection nor to guide or monitor treatment for MRSA infections.   C difficile quick scan w PCR reflex     Status: None  Collection Time: 11/08/14  4:15 AM  Result Value Ref Range Status   C Diff antigen NEGATIVE NEGATIVE Final   C Diff toxin NEGATIVE NEGATIVE Final   C Diff interpretation Negative for C. difficile  Final  Blood culture (routine x 2)     Status: None (Preliminary result)   Collection Time: 11/30/14  6:04 PM  Result Value Ref Range Status   Specimen Description BLOOD RIGHT AC  Final   Special Requests BOTTLES DRAWN AEROBIC AND ANAEROBIC  6CC  Final   Culture NO GROWTH 3 DAYS  Final   Report Status PENDING  Incomplete  Blood culture (routine x 2)     Status: None (Preliminary result)   Collection Time: 11/30/14  6:11 PM  Result Value Ref Range Status   Specimen Description BLOOD LEFT AC  Final   Special Requests BOTTLES DRAWN AEROBIC AND ANAEROBIC  1CC  Final   Culture NO GROWTH 3 DAYS  Final   Report Status PENDING  Incomplete    Coagulation Studies: No results for input(s): LABPROT, INR in the last 72 hours.  Urinalysis: No  results for input(s): COLORURINE, LABSPEC, PHURINE, GLUCOSEU, HGBUR, BILIRUBINUR, KETONESUR, PROTEINUR, UROBILINOGEN, NITRITE, LEUKOCYTESUR in the last 72 hours.  Invalid input(s): APPERANCEUR    Imaging: US Venous Img Lower Bilateral  12/01/2014   CLINICAL DATA:  Lower extremity edema.  EXAM: BILATERAL LOWER EXTREMITY VENOUS DOPPLER ULTRASOUND  TECHNIQUE: Gray-scale sonography with graded compression, as well as color Doppler and duplex ultrasound were performed to evaluate the lower extremity deep venous systems from the level of the common femoral vein and including the common femoral, femoral, profunda femoral, popliteal and calf veins including the posterior tibial, peroneal and gastrocnemius veins when visible. The superficial great saphenous vein was also interrogated. Spectral Doppler was utilized to evaluate flow at rest and with distal augmentation maneuvers in the common femoral, femoral and popliteal veins.  COMPARISON:  Prior left lower extremity venous duplex ultrasound on 02/23/2014.  FINDINGS: RIGHT LOWER EXTREMITY  Common Femoral Vein: No evidence of thrombus. Normal compressibility, respiratory phasicity and response to augmentation.  Saphenofemoral Junction: No evidence of thrombus. Normal compressibility and flow on color Doppler imaging.  Profunda Femoral Vein: No evidence of thrombus. Normal compressibility and flow on color Doppler imaging.  Femoral Vein: No evidence of thrombus. Normal compressibility, respiratory phasicity and response to augmentation.  Popliteal Vein: No evidence of thrombus. Normal compressibility, respiratory phasicity and response to augmentation.  Calf Veins: No evidence of thrombus. Normal compressibility and flow on color Doppler imaging.  Superficial Great Saphenous Vein: No evidence of thrombus. Normal compressibility and flow on color Doppler imaging.  Venous Reflux:  None.  Other Findings: No evidence of superficial thrombophlebitis or abnormal fluid  collection.  LEFT LOWER EXTREMITY  Common Femoral Vein: No evidence of thrombus. Normal compressibility, respiratory phasicity and response to augmentation.  Saphenofemoral Junction: No evidence of thrombus. Normal compressibility and flow on color Doppler imaging.  Profunda Femoral Vein: No evidence of thrombus. Normal compressibility and flow on color Doppler imaging.  Femoral Vein: No evidence of thrombus. Normal compressibility, respiratory phasicity and response to augmentation.  Popliteal Vein: No evidence of thrombus. Normal compressibility, respiratory phasicity and response to augmentation.  Calf Veins: No evidence of thrombus. Normal compressibility and flow on color Doppler imaging.  Superficial Great Saphenous Vein: No evidence of thrombus. Normal compressibility and flow on color Doppler imaging.  Venous Reflux:  None.  Other Findings: No evidence of superficial thrombophlebitis or abnormal fluid collection.  IMPRESSION: No evidence of bilateral lower extremity deep venous thrombosis.   Electronically Signed   By: Aletta Edouard M.D.   On: 12/01/2014 17:06   Dg Chest Port 1 View  12/02/2014   CLINICAL DATA:  Respiratory failure.  Pulmonary edema.  EXAM: PORTABLE CHEST 1 VIEW  COMPARISON:  11/30/2014 chest radiograph  FINDINGS: Stable cardiomediastinal silhouette with mild cardiomegaly. No pneumothorax. No pleural effusion. There is severe patchy fluffy airspace disease throughout both lungs, slightly worsened in the right upper lung. Stable small bilateral pleural effusions.  IMPRESSION: 1. Stable mild cardiomegaly. 2. Severe patchy fluffy airspace disease throughout both lungs, slightly worsened on the right upper lung. Differential includes severe pulmonary edema or severe infectious or inflammatory pneumonia. 3. Stable small bilateral pleural effusions.   Electronically Signed   By: Ilona Sorrel M.D.   On: 12/02/2014 10:00     Medications:     . amLODipine  5 mg Oral QHS  . antiseptic  oral rinse  7 mL Mouth Rinse q12n4p  . aspirin EC  81 mg Oral Daily  . atorvastatin  10 mg Oral QHS  . budesonide  0.25 mg Nebulization BID  . chlorhexidine  15 mL Mouth Rinse BID  . citalopram  40 mg Oral QPM  . clopidogrel  75 mg Oral QHS  . donepezil  5 mg Oral Daily  . furosemide  80 mg Intravenous 3 times per day  . glimepiride  2 mg Oral BID  . guaiFENesin  600 mg Oral BID  . heparin  5,000 Units Subcutaneous 3 times per day  . hydrALAZINE  50 mg Oral TID  . insulin aspart  0-5 Units Subcutaneous QHS  . insulin aspart  0-9 Units Subcutaneous TID WC  . insulin detemir  10 Units Subcutaneous QHS  . ipratropium-albuterol  3 mL Nebulization Q6H  . isosorbide mononitrate  90 mg Oral BID  . Liraglutide  1.8 mg Subcutaneous Daily  . metoprolol tartrate  25 mg Oral BID  . polycarbophil  625 mg Oral Daily  . sodium chloride  3 mL Intravenous Q12H  . vitamin B-12  1,000 mcg Oral Daily   benzonatate, guaiFENesin-dextromethorphan, ondansetron, sodium chloride  Assessment/ Plan:  69 y.o. female Felicia Vincent is a 69 y.o. white female with diabetic kidney disease, hypertension, diabetic retinopathy, bilateral cataracts, history of TIA and chronic kidney disease stage V admitted on 11/30/2014 for Edema [R60.9] Dyspnea [R06.00]  1. 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 Patient will need dialysis in the future. However now with volume overload and pulmonary edema that is being treated with furosemide.  Chronic Kidney Disease is thought to be due to diabetic nephropathy.  - Continue to monitor urine output, electrolytes and renal function.  - Currently without access.  - renally dose medications.  - Currently not on an ACE-I/ARB. Previously on losartan  2. 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   3. Hypertension: now improved. Volume driven.  - started on metoprolol by cardiology.  - amlodipine, hydralazine, and imdur.  - ACE-I/ARB as above.    LOS: [place], [place] 10/9/201611:16 AM

## 2014-12-03 NOTE — Progress Notes (Signed)
[facility] [place] NAME: Felicia Vincent    MR#:  [id number]  DATE OF BIRTH:  [birth year]  SUBJECTIVE:  CHIEF COMPLAINT:   Chief Complaint  Patient presents with  . Shortness of Breath   off BiPAP. Still shortness of breath present but some improvement. REVIEW OF SYSTEMS:  Review of Systems  Constitutional: Positive for malaise/fatigue. Negative for fever, weight loss and diaphoresis.  HENT: Negative for ear discharge, ear pain, hearing loss, nosebleeds, sore throat and tinnitus.   Eyes: Negative for blurred vision and pain.  Respiratory: Positive for shortness of breath. Negative for cough, hemoptysis and wheezing.   Cardiovascular: Negative for chest pain, palpitations, orthopnea and leg swelling.  Gastrointestinal: Negative for heartburn, nausea, vomiting, abdominal pain, diarrhea, constipation and blood in stool.  Genitourinary: Negative for dysuria, urgency and frequency.  Musculoskeletal: Negative for myalgias and back pain.  Skin: Negative for itching and rash.  Neurological: Positive for weakness. Negative for dizziness, tingling, tremors, focal weakness, seizures and headaches.  Psychiatric/Behavioral: Negative for depression. The patient is not nervous/anxious.    DRUG ALLERGIES:   Allergies  Allergen Reactions  . Codeine Nausea Only  . Prednisone Other (See Comments)    Pts daughter states that this medication "makes her evil".     VITALS:  Blood pressure 143/55, pulse 73, temperature 98.4 F (36.9 C), temperature source Oral, resp. rate 22, height 5\' 1"  (1.549 m), weight 80.5 kg (177 lb 7.5 oz), SpO2 96 %. PHYSICAL EXAMINATION:  Physical Exam  Constitutional: She is oriented to person, place, and time and well-developed, well-nourished, and in no distress.  HENT:  Head: Normocephalic and atraumatic.  Eyes: Conjunctivae and EOM are normal. Pupils are equal, round, and reactive to light.  Neck: Normal range of  motion. Neck supple. No tracheal deviation present. No thyromegaly present.  Cardiovascular: Normal rate, regular rhythm and normal heart sounds.   Pulmonary/Chest: Effort normal. No respiratory distress. She has no wheezes. She has rales. She exhibits no tenderness.  Abdominal: Soft. Bowel sounds are normal. She exhibits no distension. There is no tenderness.  Musculoskeletal: Normal range of motion.  Neurological: She is alert and oriented to person, place, and time. No cranial nerve deficit.  Skin: Skin is warm and dry. No rash noted.  Psychiatric: Mood and affect normal.   LABORATORY PANEL:   CBC  Recent Labs Lab 12/02/14 0622  WBC 10.0  HGB 8.3*  HCT 23.6*  PLT 167   ------------------------------------------------------------------------------------------------------------------ Chemistries   Recent Labs Lab 11/30/14 1655  12/03/14 0429  NA 134*  < > 134*  K 5.0  < > 4.4  CL 96*  < > 94*  CO2 29  < > 29  GLUCOSE 245*  < > 232*  BUN 37*  < > 43*  CREATININE 3.18*  < > 3.55*  CALCIUM 8.7*  < > 8.4*  AST 12*  --   --   ALT 12*  --   --   ALKPHOS 81  --   --   BILITOT 0.6  --   --   < > = values in this interval not displayed. RADIOLOGY:  No results found. ASSESSMENT AND PLAN:  * Acute on chronic respiratory failure with hypoxia - improving with diuresis At baseline 2 L oxygen nasal cannula at home. Secondary to CHF exacerbation  CT scan of the chest without contrast showed b/l pl effusions. Doppler studies for lower extremities - to r/o DVTs.  * Acute  diastolic congestive heart failure with pulmonary hypertension  Monitor creatinine. Appreciate cardiology input. Daily Input output. Check daily weights. Increase Lasix to 3 times a day.  * Recent pneumonia Currently there is no need for antibiotics. Afebrile.  * Hypertension Continue amlodipine and hydralazine furosemide.  * Diabetes Continue glimepiride and Levemir, on insulin sliding scale  coverage.  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 She will likely need dialysis soon. Nephrology following patient.  All the records are reviewed and case discussed with Care Management/Social Worker. Management plans discussed with the patient, family and they are in agreement.  CODE STATUS: Full Code  TOTAL TIME TAKING CARE OF THIS PATIENT: 35 minutes.    POSSIBLE D/C IN 2-3 DAYS, DEPENDING ON CLINICAL CONDITION.   Hillary Bow R M.D on 12/03/2014 at 12:49 PM  Between 7am to 6pm - Pager - [phone]  After 6pm go to [email or web address] - password [email or web address]  Office  [phone]  CC:  Primary care physician; Lavera Guise, MD

## 2014-12-03 NOTE — Progress Notes (Signed)
Dr. Darvin Neighbours notified of patients blood sugar of 459. She did not receive her Vitoza last night. Vitoza administered and will also give sliding scale. Will continue to assess and monitor.

## 2014-12-03 NOTE — Consult Note (Signed)
[name]      Date: 12/03/2014,   MRN# [id number] Felicia Vincent [birth year] Code Status:     Code Status Orders        Start     Ordered   11/30/14 2130  Full code   Continuous     11/30/14 2129     Hosp day:@LENGTHOFSTAYDAYS @ Referring MD: @ATDPROV @     PCP:      AdmissionWeight: 190 lb (86.183 kg)                 CurrentWeight: 177 lb 7.5 oz (23.5 kg) Felicia Vincent is a 69 y.o. old female seen in consultation for hypoxia     CHIEF COMPLAINT:   SOb and hypoxia at home. DOE   HISTORY OF PRESENT ILLNESS  Patient feels better today, aggressive diuresis by Nephrology On minimal oxygen therapy-will likely  need oxygen upon discharge Transferred to step down last night due to increased WOB and worsening CXR with edema On CPAP last Night and tolerated well, on 6 L [place] now    PAST MEDICAL HISTORY   Past Medical History  Diagnosis Date  . Hypertension   . Diabetes mellitus without complication ([place])   . TIA (transient ischemic attack)   . Cataract   . Stroke ([place])   . Dizziness   . Anemia   . Pneumonia      SURGICAL HISTORY   Past Surgical History  Procedure Laterality Date  . Cholecystectomy    . Lithotripsy    . Abdominal hysterectomy    . Breast surgery       FAMILY HISTORY   Family History  Problem Relation Age of Onset  . Diabetes Mother      SOCIAL HISTORY   Social History  Substance Use Topics  . Smoking status: Never Smoker   . Smokeless tobacco: Never Used  . Alcohol Use: No     MEDICATIONS    Home Medication:  No current outpatient prescriptions on file.  Current Medication:  Current facility-administered medications:  .  amLODipine (NORVASC) tablet 5 mg, 5 mg, Oral, QHS, Vaughan Basta, MD, 5 mg at 12/03/14 0017 .  antiseptic oral rinse (CPC / CETYLPYRIDINIUM CHLORIDE 0.05%) solution 7 mL, 7 mL, Mouth Rinse, q12n4p, Flora Lipps, MD .  aspirin EC tablet 81 mg, 81 mg, Oral,  Daily, Vaughan Basta, MD, 81 mg at 12/02/14 0932 .  atorvastatin (LIPITOR) tablet 10 mg, 10 mg, Oral, QHS, Vaughan Basta, MD, 10 mg at 12/02/14 2127 .  benzonatate (TESSALON) capsule 200 mg, 200 mg, Oral, TID PRN, Vaughan Basta, MD, 200 mg at 12/01/14 0608 .  budesonide (PULMICORT) nebulizer solution 0.25 mg, 0.25 mg, Nebulization, BID, Vaughan Basta, MD, 0.25 mg at 12/03/14 0744 .  chlorhexidine (PERIDEX) 0.12 % solution 15 mL, 15 mL, Mouth Rinse, BID, Flora Lipps, MD, 15 mL at 12/03/14 0000 .  citalopram (CELEXA) tablet 40 mg, 40 mg, Oral, QPM, Vaughan Basta, MD, 40 mg at 12/02/14 1733 .  clopidogrel (PLAVIX) tablet 75 mg, 75 mg, Oral, QHS, Vaughan Basta, MD, 75 mg at 12/02/14 2126 .  donepezil (ARICEPT) tablet 5 mg, 5 mg, Oral, Daily, Vaughan Basta, MD, 5 mg at 12/02/14 0933 .  furosemide (LASIX) injection 80 mg, 80 mg, Intravenous, 3 times per day, Lavonia Dana, MD .  glimepiride (AMARYL) tablet 2 mg, 2 mg, Oral, BID, Vaughan Basta, MD, 2 mg at 12/02/14 2127 .  guaiFENesin (MUCINEX) 12 hr tablet 600 mg, 600 mg, Oral,  BID, Vaughan Basta, MD, 600 mg at 12/02/14 2127 .  guaiFENesin-dextromethorphan (ROBITUSSIN DM) 100-10 MG/5ML syrup 10 mL, 10 mL, Oral, Q6H PRN, Vaughan Basta, MD .  heparin injection 5,000 Units, 5,000 Units, Subcutaneous, 3 times per day, Vaughan Basta, MD, 5,000 Units at 12/03/14 0539 .  hydrALAZINE (APRESOLINE) tablet 50 mg, 50 mg, Oral, TID, Vaughan Basta, MD, 50 mg at 12/02/14 2200 .  insulin aspart (novoLOG) injection 0-5 Units, 0-5 Units, Subcutaneous, QHS, Max Sane, MD, 0 Units at 12/01/14 2200 .  insulin aspart (novoLOG) injection 0-9 Units, 0-9 Units, Subcutaneous, TID WC, Max Sane, MD, 3 Units at 12/03/14 0802 .  insulin detemir (LEVEMIR) injection 10 Units, 10 Units, Subcutaneous, QHS, Max Sane, MD, 10 Units at 12/02/14 2239 .  ipratropium-albuterol (DUONEB) 0.5-2.5  (3) MG/3ML nebulizer solution 3 mL, 3 mL, Nebulization, Q6H, Flora Lipps, MD, 3 mL at 12/03/14 0738 .  isosorbide mononitrate (IMDUR) 24 hr tablet 90 mg, 90 mg, Oral, BID, Vaughan Basta, MD, 90 mg at 12/02/14 2238 .  Liraglutide SOPN 1.8 mg, 1.8 mg, Subcutaneous, Daily, Vaughan Basta, MD, 1.8 mg at 12/02/14 0930 .  metoprolol tartrate (LOPRESSOR) tablet 25 mg, 25 mg, Oral, BID, Teodoro Spray, MD, 25 mg at 12/03/14 0016 .  ondansetron (ZOFRAN) tablet 4 mg, 4 mg, Oral, TID PRN, Vaughan Basta, MD .  polycarbophil (FIBERCON) tablet 625 mg, 625 mg, Oral, Daily, Vaughan Basta, MD, 625 mg at 12/02/14 0933 .  sodium chloride 0.9 % injection 3 mL, 3 mL, Intravenous, Q12H, Vaughan Basta, MD, 3 mL at 12/02/14 1950 .  sodium chloride 0.9 % injection 3 mL, 3 mL, Intravenous, PRN, Vaughan Basta, MD .  vitamin B-12 (CYANOCOBALAMIN) tablet 1,000 mcg, 1,000 mcg, Oral, Daily, Vaughan Basta, MD, 1,000 mcg at 12/02/14 0932    ALLERGIES   Codeine and Prednisone     REVIEW OF SYSTEMS   Review of Systems  Constitutional: Positive for malaise/fatigue. Negative for fever, chills, weight loss and diaphoresis.  HENT: Negative for congestion and hearing loss.   Eyes: Negative for blurred vision and double vision.  Respiratory: Positive for shortness of breath. Negative for cough, hemoptysis and wheezing.   Cardiovascular: Positive for leg swelling. Negative for chest pain, palpitations and orthopnea.  Gastrointestinal: Negative for heartburn, nausea and vomiting.  Genitourinary: Negative for dysuria and urgency.  Musculoskeletal: Negative for myalgias, back pain and neck pain.  Skin: Negative for rash.  Neurological: Positive for weakness. Negative for dizziness and headaches.  Psychiatric/Behavioral: The patient is not nervous/anxious.   All other systems reviewed and are negative.    VS: BP 143/55 mmHg  Pulse 73  Temp(Src) 98.4 F (36.9 C) (Oral)   Resp 22  Ht 5\' 1"  (1.549 m)  Wt 177 lb 7.5 oz (80.5 kg)  BMI 33.55 kg/m2  SpO2 96%     PHYSICAL EXAM  Physical Exam  Constitutional: She is oriented to person, place, and time. She appears well-developed and well-nourished. No distress.  HENT:  Head: Normocephalic and atraumatic.  Mouth/Throat: No oropharyngeal exudate.  Eyes: EOM are normal. Pupils are equal, round, and reactive to light. No scleral icterus.  Neck: Normal range of motion. Neck supple.  Cardiovascular: Normal rate, regular rhythm and normal heart sounds.   No murmur heard. Pulmonary/Chest: No stridor. No respiratory distress. She has no wheezes. She has rales.  Abdominal: Soft. Bowel sounds are normal. She exhibits no distension. There is no tenderness. There is no rebound.  Musculoskeletal: Normal range of motion. She exhibits no edema.  Neurological: She is alert and oriented to person, place, and time. She displays normal reflexes. Coordination normal.  Skin: Skin is warm. She is not diaphoretic.  Psychiatric: She has a normal mood and affect.        LABS    Recent Labs     11/30/14  1655  12/01/14  0354  12/02/14  0622  12/03/14  0429  HGB  8.2*  7.6*  8.3*   --   HCT  24.2*  22.7*  23.6*   --   MCV  90.4  84.8  89.3   --   WBC  7.2  8.2  10.0   --   BUN  37*  36*  40*  43*  CREATININE  3.18*  2.94*  3.25*  3.55*  GLUCOSE  245*  61*  157*  232*  CALCIUM  8.7*  8.6*  8.6*  8.4*  ,    No results for input(s): PH in the last 72 hours.  Invalid input(s): PCO2, PO2, BASEEXCESS, BASEDEFICITE, TFT    CULTURE RESULTS   Recent Results (from the past 240 hour(s))  Blood culture (routine x 2)     Status: None (Preliminary result)   Collection Time: 11/30/14  6:04 PM  Result Value Ref Range Status   Specimen Description BLOOD RIGHT AC  Final   Special Requests BOTTLES DRAWN AEROBIC AND ANAEROBIC  6CC  Final   Culture NO GROWTH 3 DAYS  Final   Report Status PENDING  Incomplete  Blood culture  (routine x 2)     Status: None (Preliminary result)   Collection Time: 11/30/14  6:11 PM  Result Value Ref Range Status   Specimen Description BLOOD LEFT AC  Final   Special Requests BOTTLES DRAWN AEROBIC AND ANAEROBIC  1CC  Final   Culture NO GROWTH 3 DAYS  Final   Report Status PENDING  Incomplete          IMAGING    Dg Chest 1 View  11/16/2014   CLINICAL DATA:  Follow-up pneumonia  EXAM: CHEST 1 VIEW  COMPARISON:  11/14/2014  FINDINGS: Cardiomediastinal silhouette is stable. Right IJ catheter with tip in right atrium. No pulmonary edema. Again noted patchy bilateral airspace disease without change in aeration  IMPRESSION: No pulmonary edema. Right IJ catheter with tip in right atrium. Again noted patchy bilateral airspace disease without change in aeration.   Electronically Signed   By: Lahoma Crocker M.D.   On: 11/16/2014 10:49   Dg Chest 1 View  11/14/2014   CLINICAL DATA:  Dyspnea.  EXAM: CHEST  1 VIEW  COMPARISON:  11/13/2014 and 11/10/2014  FINDINGS: Right IJ central venous catheter with tip just below the cavoatrial junction without significant change. Interval removal of nasogastric tube and endotracheal tube.  Lungs are adequately inflated demonstrate persistent bilateral patchy airspace opacification which may be slightly worse over the right upper lobe compared to 11/13/2014, although greatly improved compared to 11/10/2014. No evidence of effusion. Mild stable cardiomegaly. Remainder of the exam is unchanged.  IMPRESSION: Patchy bilateral airspace process with marked improvement compared to 11/10/2014.  Stable cardiomegaly.  Right IJ central venous catheter with tip just below the cavoatrial junction.   Electronically Signed   By: Marin Olp M.D.   On: 11/14/2014 08:13   Dg Chest 2 View  11/30/2014   CLINICAL DATA:  Short of breath  EXAM: CHEST  2 VIEW  COMPARISON:  11/16/2014  FINDINGS: Lungs are under aerated. Heterogeneous opacities throughout both  lungs are worrisome for patchy  bilateral extensive airspace disease. Heart remains moderately enlarged. No pneumothorax.  IMPRESSION: Extensive bilateral airspace disease.   Electronically Signed   By: Marybelle Killings M.D.   On: 11/30/2014 17:32   Dg Abd 1 View  11/10/2014   CLINICAL DATA:  Encounter for intubation Z01.818 (ICD-10-CM) Encounter for orogastric (OG) tube placement Z46.59 (ICD-10-CM)  EXAM: ABDOMEN - 1 VIEW  COMPARISON:  None.  FINDINGS: Orogastric tube passes well below the diaphragm into the distal stomach, projecting in the right mid abdomen.  There is a paucity of bowel gas.  Vascular calcifications are noted.  Lung bases irregular interstitial airspace opacities.  IMPRESSION: Orogastric tube tip projects in the distal stomach.   Electronically Signed   By: Lajean Manes M.D.   On: 11/10/2014 20:29   Ct Chest Wo Contrast  11/30/2014   CLINICAL DATA:  Respiratory failure and pneumonia  EXAM: CT CHEST WITHOUT CONTRAST  TECHNIQUE: Multidetector CT imaging of the chest was performed following the standard protocol without IV contrast.  COMPARISON:  None.  FINDINGS: 1.5 cm precarinal lymph node. 12 mm short axis diameter right peritracheal lymph node. 18 mm subcarinal node.  Dense calcification in the left lobe of the thyroid gland.  Atherosclerotic calcifications in the mediastinum including 3 vessel coronary artery calcification.  Moderate bilateral pleural effusions right greater than left.  Ground-glass and confluent pulmonary opacities are seen throughout both lungs associated with the areas of interlobular septal thickening.  No vertebral compression deformity.  IMPRESSION: Bilateral pleural effusions  Mediastinal adenopathy. Findings may be related to neoplasm, inflammatory disease, or pulmonary edema  Bilateral pulmonary opacities as described. Findings may represent pulmonary edema or an inflammatory process. If the findings are chronic, interstitial lung disease or chronic organizing pneumonia are considerations.    Electronically Signed   By: Marybelle Killings M.D.   On: 11/30/2014 21:13   US Venous Img Lower Bilateral  12/01/2014   CLINICAL DATA:  Lower extremity edema.  EXAM: BILATERAL LOWER EXTREMITY VENOUS DOPPLER ULTRASOUND  TECHNIQUE: Gray-scale sonography with graded compression, as well as color Doppler and duplex ultrasound were performed to evaluate the lower extremity deep venous systems from the level of the common femoral vein and including the common femoral, femoral, profunda femoral, popliteal and calf veins including the posterior tibial, peroneal and gastrocnemius veins when visible. The superficial great saphenous vein was also interrogated. Spectral Doppler was utilized to evaluate flow at rest and with distal augmentation maneuvers in the common femoral, femoral and popliteal veins.  COMPARISON:  Prior left lower extremity venous duplex ultrasound on 02/23/2014.  FINDINGS: RIGHT LOWER EXTREMITY  Common Femoral Vein: No evidence of thrombus. Normal compressibility, respiratory phasicity and response to augmentation.  Saphenofemoral Junction: No evidence of thrombus. Normal compressibility and flow on color Doppler imaging.  Profunda Femoral Vein: No evidence of thrombus. Normal compressibility and flow on color Doppler imaging.  Femoral Vein: No evidence of thrombus. Normal compressibility, respiratory phasicity and response to augmentation.  Popliteal Vein: No evidence of thrombus. Normal compressibility, respiratory phasicity and response to augmentation.  Calf Veins: No evidence of thrombus. Normal compressibility and flow on color Doppler imaging.  Superficial Great Saphenous Vein: No evidence of thrombus. Normal compressibility and flow on color Doppler imaging.  Venous Reflux:  None.  Other Findings: No evidence of superficial thrombophlebitis or abnormal fluid collection.  LEFT LOWER EXTREMITY  Common Femoral Vein: No evidence of thrombus. Normal compressibility, respiratory phasicity and response to  augmentation.  Saphenofemoral  Junction: No evidence of thrombus. Normal compressibility and flow on color Doppler imaging.  Profunda Femoral Vein: No evidence of thrombus. Normal compressibility and flow on color Doppler imaging.  Femoral Vein: No evidence of thrombus. Normal compressibility, respiratory phasicity and response to augmentation.  Popliteal Vein: No evidence of thrombus. Normal compressibility, respiratory phasicity and response to augmentation.  Calf Veins: No evidence of thrombus. Normal compressibility and flow on color Doppler imaging.  Superficial Great Saphenous Vein: No evidence of thrombus. Normal compressibility and flow on color Doppler imaging.  Venous Reflux:  None.  Other Findings: No evidence of superficial thrombophlebitis or abnormal fluid collection.  IMPRESSION: No evidence of bilateral lower extremity deep venous thrombosis.   Electronically Signed   By: Aletta Edouard M.D.   On: 12/01/2014 17:06   Dg Chest Port 1 View  12/02/2014   CLINICAL DATA:  Respiratory failure.  Pulmonary edema.  EXAM: PORTABLE CHEST 1 VIEW  COMPARISON:  11/30/2014 chest radiograph  FINDINGS: Stable cardiomediastinal silhouette with mild cardiomegaly. No pneumothorax. No pleural effusion. There is severe patchy fluffy airspace disease throughout both lungs, slightly worsened in the right upper lung. Stable small bilateral pleural effusions.  IMPRESSION: 1. Stable mild cardiomegaly. 2. Severe patchy fluffy airspace disease throughout both lungs, slightly worsened on the right upper lung. Differential includes severe pulmonary edema or severe infectious or inflammatory pneumonia. 3. Stable small bilateral pleural effusions.   Electronically Signed   By: Ilona Sorrel M.D.   On: 12/02/2014 10:00   Dg Chest Port 1 View  11/13/2014   CLINICAL DATA:  Followup acute hypoxic respiratory failure secondary to bilateral pneumonia. Cough. Now possible CHF.  EXAM: PORTABLE CHEST - 1 VIEW  COMPARISON:  11/11/2014   FINDINGS: Support devices remain in stable position. Improving aeration of the lungs with decreasing bilateral airspace disease. Cardiomegaly. No visible effusions. No acute bony abnormality.  IMPRESSION: Bilateral airspace opacities have improved, likely improving edema and/or pneumonia.   Electronically Signed   By: Rolm Baptise M.D.   On: 11/13/2014 08:57   Dg Chest Port 1 View  11/11/2014   CLINICAL DATA:  Respiratory failure, ventilatory support  EXAM: PORTABLE CHEST - 1 VIEW  COMPARISON:  11/10/2014  FINDINGS: Endotracheal tube 1.6 cm above the carina. NG tube extends into the stomach with the tip not visualized. Right IJ central line tip proximal right atrium as before. Severe patchy bilateral airspace process throughout both lungs with some sparing of the left upper lobe compatible with pneumonia versus alveolar edema or hemorrhage. No enlarging effusion or pneumothorax. No significant interval change.  IMPRESSION: Stable support apparatus  Persistent asymmetric bilateral airspace disease compatible with pneumonia. Little interval change.   Electronically Signed   By: Jerilynn Mages.  Shick M.D.   On: 11/11/2014 09:31   Dg Chest Port 1 View  11/10/2014   CLINICAL DATA:  Intubated  EXAM: PORTABLE CHEST - 1 VIEW  COMPARISON:  11/10/2014  FINDINGS: Cardiomediastinal silhouette is stable. Bilateral airspace opacities are again noted without change in aeration. Endotracheal tube in place with tip 2.8 cm above the carina. NG tube in place with tip in proximal stomach. There is no pneumothorax. There is right IJ central line with tip in SVC right atrium junction.  IMPRESSION: Endotracheal and NG tube in place. Again noted bilateral airspace opacities without change in aeration. No pneumothorax.   Electronically Signed   By: Lahoma Crocker M.D.   On: 11/10/2014 20:20   Dg Chest Port 1 View  11/10/2014   CLINICAL  DATA:  Evaluate pneumonia.  EXAM: PORTABLE CHEST - 1 VIEW  COMPARISON:  11/09/2014  FINDINGS: The normal heart  size. Aortic atherosclerosis identified. Persistent bilateral pulmonary opacities identified. There is slightly improved aeration to the right upper lobe with worsening aeration to the right lower lobe.  IMPRESSION: 1. Persistent bilateral airspace opacities with slightly improved aeration to the right upper lobe.   Electronically Signed   By: Kerby Moors M.D.   On: 11/10/2014 09:31   Dg Chest Port 1 View  11/09/2014   CLINICAL DATA:  Respiratory failure.  EXAM: PORTABLE CHEST - 1 VIEW  COMPARISON:  11/07/2014 .  FINDINGS: Mediastinum is stable. Stable cardiomegaly. Persistent multifocal pulmonary infiltrates particularly prominent in the right upper lobe again noted. Partial atelectasis of the right upper lobe noted. Pleural fluid in the right major fissure cannot be excluded. No pneumothorax. No acute osseus abnormality .  IMPRESSION: 1. Persistent multifocal bilateral pulmonary infiltrates, particular prominent in the right upper lobe. Partial atelectasis of the right upper lobe present . Findings are most consistent with multifocal pneumonia. Small amount of pleural fluid in the right major fissure cannot be excluded.  2.  Stable cardiomegaly.   Electronically Signed   By: Marcello Moores  Register   On: 11/09/2014 08:05   Dg Chest Port 1 View  11/07/2014   CLINICAL DATA:  Dyspnea.  EXAM: PORTABLE CHEST - 1 VIEW  COMPARISON:  October 31, 2014  FINDINGS: The heart size and mediastinal contours are stable. The heart size is enlarged. There is diffuse consolidation throughout the right lung. Patchy consolidation of the left mid and lung base are identified. The visualized skeletal structures are unremarkable.  IMPRESSION: Pneumonias in bilateral lungs.   Electronically Signed   By: Abelardo Diesel M.D.   On: 11/07/2014 13:29        ASSESSMENT/PLAN   69 yo white female admitted for acute Worsening of SOB from acute diastolic heart failure with renal insufficiency  1.oxygen as needed 2.CPAP at night and  when sleeping during day 3.lasix  as tolerated dosed by nephrology 4.no indication for Bronch/abx or steroids at this time 5.continue step down status  I have personally obtained a history, examined the patient, evaluated laboratory and independently reviewed imaging results, formulated the assessment and plan and placed orders.  The Patient requires high complexity decision making for assessment and support, frequent evaluation and titration of therapies, application of advanced monitoring technologies and extensive interpretation of multiple databases.   Patient satisfied with Plan of action and management. All questions answered  Corrin Parker, M.D.  [facility]  Medical Director [place] Director [facility] Cardio-Pulmonary Department

## 2014-12-03 NOTE — Progress Notes (Signed)
[facility]  SUBJECTIVE: Events of last pm noted. Pt looks much improved over night with aggressive diuresis and bipap per Dr. Darvin Neighbours. Breathing easily this am with no complaints. Diuresed 2.5 liters past 24 hours and over 5 liters since admission.   Filed Vitals:   12/03/14 0500 12/03/14 0600 12/03/14 0700 12/03/14 0800  BP: 129/51 124/49 137/55 143/55  Pulse: 73 72 73 73  Temp:    98.4 F (36.9 C)  TempSrc:    Oral  Resp: 23 23 25 22   Height:      Weight:      SpO2: 97% 97% 97% 96%    Intake/Output Summary (Last 24 hours) at 12/03/14 0927 Last data filed at 12/03/14 0615  Gross per 24 hour  Intake      3 ml  Output   2571 ml  Net  -2568 ml    LABS: Basic Metabolic Panel:  Recent Labs  12/02/14 0622 12/03/14 0429  NA 134* 134*  K 4.5 4.4  CL 95* 94*  CO2 28 29  GLUCOSE 157* 232*  BUN 40* 43*  CREATININE 3.25* 3.55*  CALCIUM 8.6* 8.4*   Liver Function Tests:  Recent Labs  11/30/14 1655  AST 12*  ALT 12*  ALKPHOS 81  BILITOT 0.6  PROT 6.9  ALBUMIN 2.4*   No results for input(s): LIPASE, AMYLASE in the last 72 hours. CBC:  Recent Labs  12/01/14 0354 12/02/14 0622  WBC 8.2 10.0  HGB 7.6* 8.3*  HCT 22.7* 23.6*  MCV 84.8 89.3  PLT 147* 167   Cardiac Enzymes:  Recent Labs  11/30/14 1655  TROPONINI <0.03   BNP: Invalid input(s): POCBNP D-Dimer: No results for input(s): DDIMER in the last 72 hours. Hemoglobin A1C: No results for input(s): HGBA1C in the last 72 hours. Fasting Lipid Panel: No results for input(s): CHOL, HDL, LDLCALC, TRIG, CHOLHDL, LDLDIRECT in the last 72 hours. Thyroid Function Tests: No results for input(s): TSH, T4TOTAL, T3FREE, THYROIDAB in the last 72 hours.  Invalid input(s): FREET3 Anemia Panel: No results for input(s): VITAMINB12, FOLATE, FERRITIN, TIBC, IRON, RETICCTPCT in the last 72 hours.   Physical Exam: Blood pressure 143/55, pulse 73, temperature 98.4 F (36.9 C),  temperature source Oral, resp. rate 22, height 5\' 1"  (1.549 m), weight 80.5 kg (177 lb 7.5 oz), SpO2 96 %.    General appearance: alert and cooperative Resp: clear to auscultation bilaterally Cardio: regular rate and rhythm GI: soft, non-tender; bowel sounds normal; no masses,  no organomegaly Extremities: edema 1+ edema Neurologic: Grossly normal  TELEMETRY: Reviewed telemetry pt in nsr:  ASSESSMENT AND PLAN:  Principal Problem:   Acute on chronic respiratory failure with hypoxia (HCC)-severe exacerbation last pm with cxr showing patchy infiltrate c/w pulmonary edema or inflammation. Imrpoved with diuresis suggesting volume over load.  improved with aggressive diuresis and bipap. On 2 liters nasal canula this am lung exam improved. Will continue with lasix 80 tid and follow renal function and symptoms. GFR reduced slightly over night.  Active Problems:   Acute on chronic respiratory failure with hypercapnia (HCC)-conitnue with oxygen and bipap as needed for hypercapnea.    Pressure ulcer    Teodoro Spray., MD, [facility] 12/03/2014 9:27 AM

## 2014-12-04 ENCOUNTER — Inpatient Hospital Stay: Payer: Medicare Other

## 2014-12-04 LAB — TYPE AND SCREEN
ABO/RH(D): A POS
Antibody Screen: NEGATIVE
Unit division: 0

## 2014-12-04 LAB — BASIC METABOLIC PANEL
Anion gap: 12 (ref 5–15)
BUN: 50 mg/dL — AB (ref 6–20)
CHLORIDE: 91 mmol/L — AB (ref 101–111)
CO2: 30 mmol/L (ref 22–32)
CREATININE: 3.48 mg/dL — AB (ref 0.44–1.00)
Calcium: 8.5 mg/dL — ABNORMAL LOW (ref 8.9–10.3)
GFR calc Af Amer: 14 mL/min — ABNORMAL LOW (ref >60)
GFR calc non Af Amer: 12 mL/min — ABNORMAL LOW (ref >60)
Glucose, Bld: 173 mg/dL — ABNORMAL HIGH (ref 65–99)
Potassium: 4.1 mmol/L (ref 3.5–5.1)
Sodium: 133 mmol/L — ABNORMAL LOW (ref 135–145)

## 2014-12-04 LAB — CBC WITH DIFFERENTIAL/PLATELET
Basophils Absolute: 0.1 10*3/uL (ref 0–0.1)
Basophils Relative: 1 %
EOS ABS: 0.4 10*3/uL (ref 0–0.7)
EOS PCT: 4 %
HCT: 23.3 % — ABNORMAL LOW (ref 35.0–47.0)
HEMOGLOBIN: 8 g/dL — AB (ref 12.0–16.0)
LYMPHS ABS: 1.3 10*3/uL (ref 1.0–3.6)
LYMPHS PCT: 13 %
MCH: 30.6 pg (ref 26.0–34.0)
MCHC: 34.4 g/dL (ref 32.0–36.0)
MCV: 88.8 fL (ref 80.0–100.0)
MONOS PCT: 10 %
Monocytes Absolute: 1 10*3/uL — ABNORMAL HIGH (ref 0.2–0.9)
Neutro Abs: 7.2 10*3/uL — ABNORMAL HIGH (ref 1.4–6.5)
Neutrophils Relative %: 72 %
PLATELETS: 219 10*3/uL (ref 150–440)
RBC: 2.62 MIL/uL — ABNORMAL LOW (ref 3.80–5.20)
RDW: 15.9 % — ABNORMAL HIGH (ref 11.5–14.5)
WBC: 9.8 10*3/uL (ref 3.6–11.0)

## 2014-12-04 LAB — GLUCOSE, CAPILLARY
GLUCOSE-CAPILLARY: 228 mg/dL — AB (ref 65–99)
GLUCOSE-CAPILLARY: 181 mg/dL — AB (ref 65–99)
GLUCOSE-CAPILLARY: 267 mg/dL — AB (ref 65–99)
Glucose-Capillary: 328 mg/dL — ABNORMAL HIGH (ref 65–99)

## 2014-12-04 MED ORDER — ZOLPIDEM TARTRATE 5 MG PO TABS
5.0000 mg | ORAL_TABLET | Freq: Every evening | ORAL | Status: DC | PRN
  Administered 2014-12-04: 2.5 mg via ORAL
  Filled 2014-12-04 (×2): qty 1

## 2014-12-04 MED ORDER — INSULIN DETEMIR 100 UNIT/ML ~~LOC~~ SOLN
15.0000 [IU] | Freq: Every day | SUBCUTANEOUS | Status: DC
  Administered 2014-12-04: 15 [IU] via SUBCUTANEOUS
  Filled 2014-12-04 (×2): qty 0.15

## 2014-12-04 MED ORDER — DIPHENHYDRAMINE HCL 25 MG PO CAPS: 25.0000 mg | ORAL_CAPSULE | Freq: Every evening | ORAL | Status: DC | PRN

## 2014-12-04 NOTE — Progress Notes (Signed)
Patient has severe nose bleed, spo2 on 6 liter cannula was 86%, placed on venti mask but patient still could not keeps spo2 up. Placed on non rebreather mask until nose bleed subsides, no shortness of breath.

## 2014-12-04 NOTE — Progress Notes (Signed)
Inpatient Diabetes Program Recommendations  AACE/ADA: New Consensus Statement on Inpatient Glycemic Control (2015)  Target Ranges:  Prepandial:   less than 140 mg/dL      Peak postprandial:   less than 180 mg/dL (1-2 hours)      Critically ill patients:  140 - 180 mg/dL  Results for Felicia Vincent, Felicia Vincent (MRN [id number]) as of 12/04/2014 10:52  Ref. Range 12/03/2014 07:06 12/03/2014 11:07 12/03/2014 15:43 12/03/2014 20:09 12/04/2014 07:19  Glucose-Capillary Latest Ref Range: 65-99 mg/dL 203 (H) 459 (H) 405 (H) 227 (H) 228 (H)   Review of Glycemic Control   Current orders for Inpatient glycemic control: Levemir 10 units QHS, Novolog 0-9 units TID with meals, Novolog 0-5 units HS, Amaryl 2 mg BID, Victoza 1.8 mg daily  Inpatient Diabetes Program Recommendations: Insulin - Basal: Please consider increasing Levemir to 14 units QHS. Insulin - Meal Coverage: Post prandial glucose elevated; noted to be 459 mg/dl yesterday at 11:07am.  If patient is eating at least 50% of meals, please consider ordering Novolog 4 units TID with meals for meal coverage (in addition to Novolog correction).  Thanks, Barnie Alderman, RN, MSN, CCRN, CDE Diabetes Coordinator Inpatient Diabetes Program [phone] (Team Pager from [place] to [place]) [phone] ([facility] office) [phone] [facility] office) [phone] [facility] office)

## 2014-12-04 NOTE — Progress Notes (Signed)
[place] [facility] [place] NAME: Felicia Vincent    MR#:  [id number]  DATE OF BIRTH:  June 14, 1945  SUBJECTIVE:  CHIEF COMPLAINT:   Chief Complaint  Patient presents with  . Shortness of Breath   off BiPAP. On 6 L O2. Feels bad as she could not sleep yesterday night. REVIEW OF SYSTEMS:  Review of Systems  Constitutional: Positive for malaise/fatigue. Negative for fever, weight loss and diaphoresis.  HENT: Negative for ear discharge, ear pain, hearing loss, nosebleeds, sore throat and tinnitus.   Eyes: Negative for blurred vision and pain.  Respiratory: Positive for shortness of breath. Negative for cough, hemoptysis and wheezing.   Cardiovascular: Negative for chest pain, palpitations, orthopnea and leg swelling.  Gastrointestinal: Negative for heartburn, nausea, vomiting, abdominal pain, diarrhea, constipation and blood in stool.  Genitourinary: Negative for dysuria, urgency and frequency.  Musculoskeletal: Negative for myalgias and back pain.  Skin: Negative for itching and rash.  Neurological: Positive for weakness. Negative for dizziness, tingling, tremors, focal weakness, seizures and headaches.  Psychiatric/Behavioral: Negative for depression. The patient is not nervous/anxious.    DRUG ALLERGIES:   Allergies  Allergen Reactions  . Codeine Nausea Only  . Prednisone Other (See Comments)    Pts daughter states that this medication "makes her evil".     VITALS:  Blood pressure 155/57, pulse 85, temperature 98.9 F (37.2 C), temperature source Axillary, resp. rate 27, height 5\' 1"  (1.549 m), weight 80.5 kg (177 lb 7.5 oz), SpO2 89 %. PHYSICAL EXAMINATION:  Physical Exam  Constitutional: She is oriented to person, place, and time and well-developed, well-nourished, and in no distress.  HENT:  Head: Normocephalic and atraumatic.  Eyes: Conjunctivae and EOM are normal. Pupils are equal, round, and reactive to light.  Neck: Normal range  of motion. Neck supple. No tracheal deviation present. No thyromegaly present.  Cardiovascular: Normal rate, regular rhythm and normal heart sounds.   Pulmonary/Chest: Effort normal. No respiratory distress. She has no wheezes. She has rales. She exhibits no tenderness.  Abdominal: Soft. Bowel sounds are normal. She exhibits no distension. There is no tenderness.  Musculoskeletal: Normal range of motion.  Neurological: She is alert and oriented to person, place, and time. No cranial nerve deficit.  Skin: Skin is warm and dry. No rash noted.  Psychiatric: Mood and affect normal.   LABORATORY PANEL:   CBC  Recent Labs Lab 12/04/14 0352  WBC 9.8  HGB 8.0*  HCT 23.3*  PLT 219   ------------------------------------------------------------------------------------------------------------------ Chemistries   Recent Labs Lab 11/30/14 1655  12/04/14 0352  NA 134*  < > 133*  K 5.0  < > 4.1  CL 96*  < > 91*  CO2 29  < > 30  GLUCOSE 245*  < > 173*  BUN 37*  < > 50*  CREATININE 3.18*  < > 3.48*  CALCIUM 8.7*  < > 8.5*  AST 12*  --   --   ALT 12*  --   --   ALKPHOS 81  --   --   BILITOT 0.6  --   --   < > = values in this interval not displayed. RADIOLOGY:  Dg Chest Port 1 View  12/04/2014   CLINICAL DATA:  CHF .  EXAM: PORTABLE CHEST 1 VIEW  COMPARISON:  12/02/2014.  FINDINGS: Cardiomegaly with pulmonary vascular prominence and bilateral interstitial prominence consistent congestive heart failure. Slight improvement from prior exam . Again bilateral pneumonia cannot be excluded.  No prominent pleural effusion or pneumothorax .  IMPRESSION: Cardiomegaly with diffuse bilateral pulmonary alveolar infiltrates consistent with congestive heart failure and pulmonary edema. Partial interim clearing from prior exam.   Electronically Signed   By: Marcello Moores  Register   On: 12/04/2014 10:54   ASSESSMENT AND PLAN:  * Acute on chronic respiratory failure with hypoxia - improving with diuresis At  baseline 2 L oxygen nasal cannula at home. Secondary to CHF exacerbation  CT scan of the chest without contrast showed b/l pl effusions. Doppler studies for lower extremities - to r/o DVTs. Chest x-ray repeated today shows some improvement.  * Acute diastolic congestive heart failure with pulmonary hypertension  Monitor creatinine. Appreciate cardiology input. Daily Input output. Check daily weights.  Lasix to 3 times a day.  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 She will likely need dialysis in the near future Nephrology following patient.  * Recent pneumonia Currently there is no need for antibiotics. Afebrile.  * Hypertension Continue amlodipine and hydralazine furosemide.  * Diabetes Continue glimepiride and Levemir, on insulin sliding scale coverage.  All the records are reviewed and case discussed with Care Management/Social Worker. Management plans discussed with the patient, family and they are in agreement.  CODE STATUS: Full Code  TOTAL TIME TAKING CARE OF THIS PATIENT: 35 minutes.     Hillary Bow R M.D on 12/04/2014 at 11:54 AM  Between 7am to 6pm - Pager - [phone]  After 6pm go to [email or web address] - password [email or web address] [place] [facility]  Office  [phone]  CC:  Primary care physician; Lavera Guise, MD

## 2014-12-04 NOTE — Progress Notes (Signed)
Subjective:  Pt awake and alert. Still on signficant amount of O2.   Good UOP noted.  Cr and BUN both rising however.  Objective:  Vital signs in last 24 hours:  Temp:  [98.7 F (37.1 C)-99.4 F (37.4 C)] 98.9 F (37.2 C) (10/10 0831) Pulse Rate:  [77-88] 85 (10/10 0900) Resp:  [20-34] 27 (10/10 0900) BP: (125-164)/(47-112) 155/57 mmHg (10/10 0900) SpO2:  [89 %-97 %] 89 % (10/10 0900)  Weight change:  Filed Weights   11/30/14 1640 12/01/14 0413 12/02/14 1624  Weight: 86.183 kg (190 lb) 81.602 kg (179 lb 14.4 oz) 80.5 kg (177 lb 7.5 oz)    Intake/Output:    Intake/Output Summary (Last 24 hours) at 12/04/14 1002 Last data filed at 12/04/14 0800  Gross per 24 hour  Intake   1403 ml  Output   1475 ml  Net    -72 ml     Physical Exam: General: frail, elderly, NAD  HEENT [place]/AT EOMI hearing intact OM moist  Neck supple  Pulm/lungs Basilar rales  CVS/Heart irregular rhythm, no rubs or gallop  Abdomen:  soft, nontender, BS present  Extremities: No lower extremity edema  Neurologic: Awake, alert, sitting up, follows commands  Skin: No acute rashes           Basic Metabolic Panel:   Recent Labs Lab 11/30/14 1655 12/01/14 0354 12/02/14 0622 12/03/14 0429 12/04/14 0352  NA 134* 137 134* 134* 133*  K 5.0 4.6 4.5 4.4 4.1  CL 96* 103 95* 94* 91*  CO2 29 28 28 29 30   GLUCOSE 245* 61* 157* 232* 173*  BUN 37* 36* 40* 43* 50*  CREATININE 3.18* 2.94* 3.25* 3.55* 3.48*  CALCIUM 8.7* 8.6* 8.6* 8.4* 8.5*     CBC:  Recent Labs Lab 11/30/14 1655 12/01/14 0354 12/02/14 0622 12/04/14 0352  WBC 7.2 8.2 10.0 9.8  NEUTROABS  --   --   --  7.2*  HGB 8.2* 7.6* 8.3* 8.0*  HCT 24.2* 22.7* 23.6* 23.3*  MCV 90.4 84.8 89.3 88.8  PLT 160 147* 167 219      Microbiology:  Recent Results (from the past 720 hour(s))  Culture, blood (routine x 2)     Status: None   Collection Time: 11/07/14 12:30 PM  Result Value Ref Range Status   Specimen Description BLOOD RIGHT  ASSIST CONTROL  Final   Special Requests   Final    BOTTLES DRAWN AEROBIC AND ANAEROBIC  2CC AERO 1 CC ANAERO   Culture NO GROWTH 5 DAYS  Final   Report Status 11/12/2014 FINAL  Final  Culture, blood (routine x 2)     Status: None   Collection Time: 11/07/14 12:48 PM  Result Value Ref Range Status   Specimen Description BLOOD LEFT FATTY CASTS  Final   Special Requests BOTTLES DRAWN AEROBIC AND ANAEROBIC  1CC  Final   Culture NO GROWTH 5 DAYS  Final   Report Status 11/12/2014 FINAL  Final  MRSA PCR Screening     Status: None   Collection Time: 11/07/14  6:06 PM  Result Value Ref Range Status   MRSA by PCR NEGATIVE NEGATIVE Final    Comment:        The GeneXpert MRSA Assay (FDA approved for NASAL specimens only), is one component of a comprehensive MRSA colonization surveillance program. It is not intended to diagnose MRSA infection nor to guide or monitor treatment for MRSA infections.   C difficile quick scan w PCR reflex  Status: None   Collection Time: 11/08/14  4:15 AM  Result Value Ref Range Status   C Diff antigen NEGATIVE NEGATIVE Final   C Diff toxin NEGATIVE NEGATIVE Final   C Diff interpretation Negative for C. difficile  Final  Blood culture (routine x 2)     Status: None (Preliminary result)   Collection Time: 11/30/14  6:04 PM  Result Value Ref Range Status   Specimen Description BLOOD RIGHT AC  Final   Special Requests BOTTLES DRAWN AEROBIC AND ANAEROBIC  6CC  Final   Culture NO GROWTH 4 DAYS  Final   Report Status PENDING  Incomplete  Blood culture (routine x 2)     Status: None (Preliminary result)   Collection Time: 11/30/14  6:11 PM  Result Value Ref Range Status   Specimen Description BLOOD LEFT AC  Final   Special Requests BOTTLES DRAWN AEROBIC AND ANAEROBIC  1CC  Final   Culture NO GROWTH 4 DAYS  Final   Report Status PENDING  Incomplete    Coagulation Studies: No results for input(s): LABPROT, INR in the last 72 hours.  Urinalysis: No  results for input(s): COLORURINE, LABSPEC, PHURINE, GLUCOSEU, HGBUR, BILIRUBINUR, KETONESUR, PROTEINUR, UROBILINOGEN, NITRITE, LEUKOCYTESUR in the last 72 hours.  Invalid input(s): APPERANCEUR    Imaging: No results found.   Medications:     . amLODipine  5 mg Oral QHS  . antiseptic oral rinse  7 mL Mouth Rinse q12n4p  . aspirin EC  81 mg Oral Daily  . atorvastatin  10 mg Oral QHS  . budesonide  0.25 mg Nebulization BID  . chlorhexidine  15 mL Mouth Rinse BID  . citalopram  40 mg Oral QPM  . clopidogrel  75 mg Oral QHS  . donepezil  5 mg Oral Daily  . furosemide  80 mg Intravenous 3 times per day  . glimepiride  2 mg Oral BID  . guaiFENesin  600 mg Oral BID  . heparin  5,000 Units Subcutaneous 3 times per day  . hydrALAZINE  50 mg Oral TID  . insulin aspart  0-5 Units Subcutaneous QHS  . insulin aspart  0-9 Units Subcutaneous TID WC  . insulin detemir  10 Units Subcutaneous QHS  . ipratropium-albuterol  3 mL Nebulization Q6H  . isosorbide mononitrate  90 mg Oral BID  . Liraglutide  1.8 mg Subcutaneous Daily  . metoprolol tartrate  25 mg Oral BID  . polycarbophil  625 mg Oral Daily  . sodium chloride  3 mL Intravenous Q12H  . vitamin B-12  1,000 mcg Oral Daily   benzonatate, guaiFENesin-dextromethorphan, ondansetron, sodium chloride  Assessment/ Plan:  69 y.o. female PAULEEN GOLEMAN is a 69 y.o. white female with diabetic kidney disease, hypertension, diabetic retinopathy, bilateral cataracts, history of TIA and chronic kidney disease stage V admitted on 11/30/2014 for Edema [R60.9] Dyspnea [R06.00]  1. 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 Patient will need dialysis in the future. However now with volume overload and pulmonary edema that is being treated with furosemide.  Chronic Kidney Disease is thought to be due to diabetic nephropathy.  - As above renal function  worsening but she is in need of diuresis, no urgent indication for HD, continue diuresis for now and follow renal function trend closely.  2. Acute exacerbation of congestive heart failure diastolic with pulmonary hypertension:   -  Pt to be continued on lasix 80mg  IV  q8 hours, may need to decrease dose or frequency if azotemia continues to worsen.  3. Hypertension: now improved. Volume driven.  - BP 155/57 this AM, continue amlodipine, hydralazine, imdur, metoprolol.    LOS: 4 Rustin Erhart 10/10/201610:02 AM

## 2014-12-04 NOTE — Care Management Note (Signed)
Case Management Note  Patient Details  Name: ARTISHA CAPRI MRN: [id number] Date of Birth: 1945-08-15  Subjective/Objective:   On 6L liters, bipap at HS due to respiratory distress secondary to CHF exacerbation. May need HD in the near future, not immediate. Continuing IV lasix 80 mg Q 8 hours for now.                  Action/Plan: [place] patient with [facility]   Expected Discharge Date:                  Expected Discharge Plan:  [place]  In-House Referral:     Discharge planning Services  CM Consult  Post Acute Care Choice:  Home Health Choice offered to:     DME Arranged:    DME Agency:     HH Arranged:    [place] Agency:  [place]  Status of Service:  In process, will continue to follow  Medicare Important Message Given:  Yes-third notification given Date Medicare IM Given:    Medicare IM give by:    Date Additional Medicare IM Given:    Additional Medicare Important Message give by:     If discussed at [place] of Stay Meetings, dates discussed:    Additional Comments:  Jolly Mango, RN 12/04/2014, 10:44 AM

## 2014-12-04 NOTE — Progress Notes (Signed)
Bipap last night. Off since this AM. No overt distress but requiring O2 @ 6 lpm by [place]  Filed Vitals:   12/04/14 0800 12/04/14 0831 12/04/14 0900 12/04/14 1400  BP: 146/61  155/57 160/59  Pulse: 80 88 85 85  Temp:  98.9 F (37.2 C)    TempSrc:  Axillary    Resp: 25 28 27 26   Height:      Weight:      SpO2: 97% 94% 89% 94%   NAD JVP cannot be visualized Bibasilar crackles and diminished BS, no wheezes Reg, no M Obese, soft, NT No C/C/E  BMP Latest Ref Rng 12/04/2014 12/03/2014 12/02/2014  Glucose 65 - 99 mg/dL 173(H) 232(H) 157(H)  BUN 6 - 20 mg/dL 50(H) 43(H) 40(H)  Creatinine 0.44 - 1.00 mg/dL 3.48(H) 3.55(H) 3.25(H)  Sodium 135 - 145 mmol/L 133(L) 134(L) 134(L)  Potassium 3.5 - 5.1 mmol/L 4.1 4.4 4.5  Chloride 101 - 111 mmol/L 91(L) 94(L) 95(L)  CO2 22 - 32 mmol/L 30 29 28   Calcium 8.9 - 10.3 mg/dL 8.5(L) 8.4(L) 8.6(L)    CBC Latest Ref Rng 12/04/2014 12/02/2014 12/01/2014  WBC 3.6 - 11.0 K/uL 9.8 10.0 8.2  Hemoglobin 12.0 - 16.0 g/dL 8.0(L) 8.3(L) 7.6(L)  Hematocrit 35.0 - 47.0 % 23.3(L) 23.6(L) 22.7(L)  Platelets 150 - 440 K/uL 219 167 147(L)    CXR: edema with effusions  IMPRESSION: Acute hypoxic respiratory failure due to pulmonary edema Diastolic CHF CKD, AKI - nonoliguric  PLAN: Cont in SDU Cont diuresing to extent allowed by BP and renal function  Renal service managing Cont PRN BiPAP Cont supplemental O2 to maintain SpO2 90-96%  Merton Border, MD PCCM service Mobile [phone] Pager [phone]

## 2014-12-05 ENCOUNTER — Inpatient Hospital Stay: Payer: Medicare Other

## 2014-12-05 LAB — GLUCOSE, CAPILLARY
Glucose-Capillary: 213 mg/dL — ABNORMAL HIGH (ref 65–99)
Glucose-Capillary: 117 mg/dL — ABNORMAL HIGH (ref 65–99)
Glucose-Capillary: 286 mg/dL — ABNORMAL HIGH (ref 65–99)
Glucose-Capillary: 334 mg/dL — ABNORMAL HIGH (ref 65–99)

## 2014-12-05 LAB — BASIC METABOLIC PANEL
ANION GAP: 11 (ref 5–15)
BUN: 57 mg/dL — ABNORMAL HIGH (ref 6–20)
CALCIUM: 8.2 mg/dL — AB (ref 8.9–10.3)
CO2: 31 mmol/L (ref 22–32)
Chloride: 90 mmol/L — ABNORMAL LOW (ref 101–111)
Creatinine, Ser: 3.91 mg/dL — ABNORMAL HIGH (ref 0.44–1.00)
GFR calc Af Amer: 12 mL/min — ABNORMAL LOW (ref >60)
GFR calc non Af Amer: 11 mL/min — ABNORMAL LOW (ref >60)
GLUCOSE: 283 mg/dL — AB (ref 65–99)
POTASSIUM: 4.3 mmol/L (ref 3.5–5.1)
Sodium: 132 mmol/L — ABNORMAL LOW (ref 135–145)

## 2014-12-05 LAB — BRAIN NATRIURETIC PEPTIDE: B NATRIURETIC PEPTIDE 5: 139 pg/mL — AB (ref 0.0–100.0)

## 2014-12-05 LAB — PROCALCITONIN: PROCALCITONIN: 0.69 ng/mL

## 2014-12-05 LAB — SEDIMENTATION RATE: Sed Rate: 125 mm/hr — ABNORMAL HIGH (ref 0–30)

## 2014-12-05 MED ORDER — INSULIN DETEMIR 100 UNIT/ML ~~LOC~~ SOLN
20.0000 [IU] | Freq: Every day | SUBCUTANEOUS | Status: DC
  Administered 2014-12-05: 20 [IU] via SUBCUTANEOUS
  Filled 2014-12-05: qty 0.2

## 2014-12-05 MED ORDER — METHYLPREDNISOLONE SODIUM SUCC 125 MG IJ SOLR
80.0000 mg | Freq: Once | INTRAMUSCULAR | Status: AC
  Administered 2014-12-05: 80 mg via INTRAVENOUS
  Filled 2014-12-05: qty 2

## 2014-12-05 MED ORDER — IPRATROPIUM-ALBUTEROL 0.5-2.5 (3) MG/3ML IN SOLN
3.0000 mL | RESPIRATORY_TRACT | Status: DC | PRN
Start: 2014-12-05 — End: 2014-12-06

## 2014-12-05 MED ORDER — METHYLPREDNISOLONE SODIUM SUCC 40 MG IJ SOLR: 40.0000 mg | Freq: Two times a day (BID) | INTRAMUSCULAR | Status: DC

## 2014-12-05 NOTE — Progress Notes (Signed)
Inpatient Diabetes Program Recommendations  AACE/ADA: New Consensus Statement on Inpatient Glycemic Control (2015)  Target Ranges:  Prepandial:   less than 140 mg/dL      Peak postprandial:   less than 180 mg/dL (1-2 hours)      Critically ill patients:  140 - 180 mg/dL   Review of Glycemic Control  Results for Felicia Vincent, Felicia Vincent (MRN [id number]) as of 12/05/2014 08:45  Ref. Range 12/03/2014 20:09 12/04/2014 07:19 12/04/2014 13:23 12/04/2014 16:23 12/04/2014 21:21 12/05/2014 07:23  Glucose-Capillary Latest Ref Range: 65-99 mg/dL 227 (H) 228 (H) 328 (H) 181 (H) 267 (H) 213 (H)   Current orders for Inpatient glycemic control: Levemir 15 units QHS, Novolog 0-9 units TID with meals, Novolog 0-5 units HS, Victoza 1.8 mg daily  Inpatient Diabetes Program Recommendations:  Meal Coverage: Post prandial glucose elevated; noted to be 328 mg/dl yesterday at 13:23pm. If patient is eating at least 50% of meals, please consider ordering Novolog 4 units TID with meals for meal coverage (in addition to Novolog correction).  Fasting blood sugar still above ADA goals.  Consider increasing Levemir insulin to 18 units qhs.    [name], RN, BA, MHA, CDE Diabetes Coordinator Inpatient Diabetes Program  [phone] (Team Pager) [phone] ([place]) 12/05/2014 8:52 AM

## 2014-12-05 NOTE — Progress Notes (Signed)
Subjective:  Pt seen at bedside.  On nonrebreather mask this AM, sats 100%. Renal function worse, pt on lasix 80mg  IV q8 hours. UOP only 950cc.    Objective:  Vital signs in last 24 hours:  Temp:  [97.8 F (36.6 C)-98.9 F (37.2 C)] 98.5 F (36.9 C) (10/10 1900) Pulse Rate:  [80-98] 84 (10/11 0400) Resp:  [23-30] 24 (10/11 0400) BP: (140-170)/(50-61) 144/61 mmHg (10/11 0400) SpO2:  [81 %-99 %] 88 % (10/11 0400) FiO2 (%):  [55 %] 55 % (10/11 0158)  Weight change:  Filed Weights   11/30/14 1640 12/01/14 0413 12/02/14 1624  Weight: 86.183 kg (190 lb) 81.602 kg (179 lb 14.4 oz) 80.5 kg (177 lb 7.5 oz)    Intake/Output:    Intake/Output Summary (Last 24 hours) at 12/05/14 2297 Last data filed at 12/04/14 2126  Gross per 24 hour  Intake    843 ml  Output    950 ml  Net   -107 ml     Physical Exam: General: frail, elderly, NAD  HEENT [place]/AT EOMI hearing intact OM moist  Neck supple  Pulm/lungs Basilar rales normal effort on nonrebreather mask  CVS/Heart S1S2, no rubs or gallop  Abdomen:  soft, nontender, BS present  Extremities: No lower extremity edema  Neurologic: Awake, alert, sitting up, follows commands  Skin: No acute rashes           Basic Metabolic Panel:   Recent Labs Lab 12/01/14 0354 12/02/14 0622 12/03/14 0429 12/04/14 0352 12/05/14 0411  NA 137 134* 134* 133* 132*  K 4.6 4.5 4.4 4.1 4.3  CL 103 95* 94* 91* 90*  CO2 28 28 29 30 31   GLUCOSE 61* 157* 232* 173* 283*  BUN 36* 40* 43* 50* 57*  CREATININE 2.94* 3.25* 3.55* 3.48* 3.91*  CALCIUM 8.6* 8.6* 8.4* 8.5* 8.2*     CBC:  Recent Labs Lab 11/30/14 1655 12/01/14 0354 12/02/14 0622 12/04/14 0352  WBC 7.2 8.2 10.0 9.8  NEUTROABS  --   --   --  7.2*  HGB 8.2* 7.6* 8.3* 8.0*  HCT 24.2* 22.7* 23.6* 23.3*  MCV 90.4 84.8 89.3 88.8  PLT 160 147* 167 219      Microbiology:  Recent Results (from the past 720 hour(s))  Culture, blood (routine x 2)     Status: None   Collection  Time: 11/07/14 12:30 PM  Result Value Ref Range Status   Specimen Description BLOOD RIGHT ASSIST CONTROL  Final   Special Requests   Final    BOTTLES DRAWN AEROBIC AND ANAEROBIC  2CC AERO 1 CC ANAERO   Culture NO GROWTH 5 DAYS  Final   Report Status 11/12/2014 FINAL  Final  Culture, blood (routine x 2)     Status: None   Collection Time: 11/07/14 12:48 PM  Result Value Ref Range Status   Specimen Description BLOOD LEFT FATTY CASTS  Final   Special Requests BOTTLES DRAWN AEROBIC AND ANAEROBIC  1CC  Final   Culture NO GROWTH 5 DAYS  Final   Report Status 11/12/2014 FINAL  Final  MRSA PCR Screening     Status: None   Collection Time: 11/07/14  6:06 PM  Result Value Ref Range Status   MRSA by PCR NEGATIVE NEGATIVE Final    Comment:        The GeneXpert MRSA Assay (FDA approved for NASAL specimens only), is one component of a comprehensive MRSA colonization surveillance program. It is not intended to diagnose MRSA infection nor  to guide or monitor treatment for MRSA infections.   C difficile quick scan w PCR reflex     Status: None   Collection Time: 11/08/14  4:15 AM  Result Value Ref Range Status   C Diff antigen NEGATIVE NEGATIVE Final   C Diff toxin NEGATIVE NEGATIVE Final   C Diff interpretation Negative for C. difficile  Final  Blood culture (routine x 2)     Status: None (Preliminary result)   Collection Time: 11/30/14  6:04 PM  Result Value Ref Range Status   Specimen Description BLOOD RIGHT AC  Final   Special Requests BOTTLES DRAWN AEROBIC AND ANAEROBIC  6CC  Final   Culture NO GROWTH 4 DAYS  Final   Report Status PENDING  Incomplete  Blood culture (routine x 2)     Status: None (Preliminary result)   Collection Time: 11/30/14  6:11 PM  Result Value Ref Range Status   Specimen Description BLOOD LEFT AC  Final   Special Requests BOTTLES DRAWN AEROBIC AND ANAEROBIC  1CC  Final   Culture NO GROWTH 4 DAYS  Final   Report Status PENDING  Incomplete    Coagulation  Studies: No results for input(s): LABPROT, INR in the last 72 hours.  Urinalysis: No results for input(s): COLORURINE, LABSPEC, PHURINE, GLUCOSEU, HGBUR, BILIRUBINUR, KETONESUR, PROTEINUR, UROBILINOGEN, NITRITE, LEUKOCYTESUR in the last 72 hours.  Invalid input(s): APPERANCEUR    Imaging: Dg Chest Port 1 View  12/04/2014   CLINICAL DATA:  CHF .  EXAM: PORTABLE CHEST 1 VIEW  COMPARISON:  12/02/2014.  FINDINGS: Cardiomegaly with pulmonary vascular prominence and bilateral interstitial prominence consistent congestive heart failure. Slight improvement from prior exam . Again bilateral pneumonia cannot be excluded. No prominent pleural effusion or pneumothorax .  IMPRESSION: Cardiomegaly with diffuse bilateral pulmonary alveolar infiltrates consistent with congestive heart failure and pulmonary edema. Partial interim clearing from prior exam.   Electronically Signed   By: Marcello Moores  Register   On: 12/04/2014 10:54     Medications:     . amLODipine  5 mg Oral QHS  . antiseptic oral rinse  7 mL Mouth Rinse q12n4p  . aspirin EC  81 mg Oral Daily  . atorvastatin  10 mg Oral QHS  . budesonide  0.25 mg Nebulization BID  . chlorhexidine  15 mL Mouth Rinse BID  . citalopram  40 mg Oral QPM  . clopidogrel  75 mg Oral QHS  . donepezil  5 mg Oral Daily  . guaiFENesin  600 mg Oral BID  . heparin  5,000 Units Subcutaneous 3 times per day  . hydrALAZINE  50 mg Oral TID  . insulin aspart  0-5 Units Subcutaneous QHS  . insulin aspart  0-9 Units Subcutaneous TID WC  . insulin detemir  15 Units Subcutaneous QHS  . ipratropium-albuterol  3 mL Nebulization Q6H  . isosorbide mononitrate  90 mg Oral BID  . Liraglutide  1.8 mg Subcutaneous Daily  . metoprolol tartrate  25 mg Oral BID  . polycarbophil  625 mg Oral Daily  . sodium chloride  3 mL Intravenous Q12H  . vitamin B-12  1,000 mcg Oral Daily   benzonatate, guaiFENesin-dextromethorphan, ondansetron, sodium chloride, zolpidem  Assessment/ Plan:   69 y.o. female ELAN MCELVAIN is a 69 y.o. white female with diabetic kidney disease, hypertension, diabetic retinopathy, bilateral cataracts, history of TIA and chronic kidney disease stage V admitted on 11/30/2014 for Edema [R60.9] Dyspnea [R06.00]  1. Acute Renal failure on Chronic Kidney Disease stage  IV with nephrotic range proteinuria: creatinine slowly rising with high dose diuresis. Patient will need dialysis in the future. However now with volume overload and pulmonary edema that is being treated with furosemide.  Chronic Kidney Disease is thought to be due to diabetic nephropathy.  -Pt given lasix but renal function worsening, may have intravascular volume depletion with underlying edema.  Currently on nonrebreather but O2 sats are 100%. Will stop lasix for now in the hopes that renal function improves.  Will monitor UOP and renal function closely.  No acute indication for dialysis at present.  2. Acute exacerbation of congestive heart failure diastolic with pulmonary hypertension:   -  Renal function worse with high dose and frequent diuretics, holding lasix for now.  3. Hypertension: now improved. Volume driven.  - BP 144/61 at 4AM, continue amlodipine, hydralazine, imdur, metoprolol.    LOS: 5 Rosaly Labarbera 10/11/20166:34 AM

## 2014-12-05 NOTE — Progress Notes (Signed)
[facility] [place] NAME: Felicia Vincent    MR#:  [id number]  DATE OF BIRTH:  Jul 11, 1945  SUBJECTIVE:  CHIEF COMPLAINT:   Chief Complaint  Patient presents with  . Shortness of Breath  Patient is sitting up drowzy on a NRB. Daughter at bedside.   REVIEW OF SYSTEMS:  Review of Systems  Constitutional: Positive for malaise/fatigue. Negative for fever, weight loss and diaphoresis.  HENT: Negative for ear discharge, ear pain, hearing loss, nosebleeds, sore throat and tinnitus.   Eyes: Negative for blurred vision and pain.  Respiratory: Positive for shortness of breath. Negative for cough, hemoptysis and wheezing.   Cardiovascular: Negative for chest pain, palpitations, orthopnea and leg swelling.  Gastrointestinal: Negative for heartburn, nausea, vomiting, abdominal pain, diarrhea, constipation and blood in stool.  Genitourinary: Negative for dysuria, urgency and frequency.  Musculoskeletal: Negative for myalgias and back pain.  Skin: Negative for itching and rash.  Neurological: Positive for weakness. Negative for dizziness, tingling, tremors, focal weakness, seizures and headaches.  Psychiatric/Behavioral: Negative for depression. The patient is not nervous/anxious.    DRUG ALLERGIES:   Allergies  Allergen Reactions  . Codeine Nausea Only  . Prednisone Other (See Comments)    Pts daughter states that this medication "makes her evil".     VITALS:  Blood pressure 138/50, pulse 80, temperature 98.8 F (37.1 C), temperature source Axillary, resp. rate 23, height 5\' 1"  (1.549 m), weight 80.5 kg (177 lb 7.5 oz), SpO2 100 %. PHYSICAL EXAMINATION:  Physical Exam   GENERAL:  69 y.o.-year-old patient lying in the bed looks critically ill EYES: Pupils equal, round, reactive to light and accommodation. No scleral icterus. Extraocular muscles intact.  HEENT: Head atraumatic, normocephalic. Oropharynx and nasopharynx clear.  NECK:  Supple,  no jugular venous distention. No thyroid enlargement, no tenderness.  LUNGS: Coarse breath sounds CARDIOVASCULAR: S1, S2 normal. No murmurs, rubs, or gallops.  ABDOMEN: Soft, nontender, nondistended. Bowel sounds present. No organomegaly or mass.  EXTREMITIES: No cyanosis, clubbing or edema b/l.    NEUROLOGIC: Cranial nerves II through XII are intact. No focal Motor or sensory deficits b/l.   PSYCHIATRIC: The patient is drowzy SKIN: No obvious rash, lesion, or ulcer.   LABORATORY PANEL:   CBC  Recent Labs Lab 12/04/14 0352  WBC 9.8  HGB 8.0*  HCT 23.3*  PLT 219   ------------------------------------------------------------------------------------------------------------------ Chemistries   Recent Labs Lab 11/30/14 1655  12/05/14 0411  NA 134*  < > 132*  K 5.0  < > 4.3  CL 96*  < > 90*  CO2 29  < > 31  GLUCOSE 245*  < > 283*  BUN 37*  < > 57*  CREATININE 3.18*  < > 3.91*  CALCIUM 8.7*  < > 8.2*  AST 12*  --   --   ALT 12*  --   --   ALKPHOS 81  --   --   BILITOT 0.6  --   --   < > = values in this interval not displayed. RADIOLOGY:  Dg Chest Port 1 View  12/05/2014   CLINICAL DATA:  Congestive heart failure.  EXAM: PORTABLE CHEST 1 VIEW  COMPARISON:  12/04/2014 .  FINDINGS: Persistent cardiomegaly and bilateral pulmonary infiltrates, no interim change from prior exam. Findings consistent persistent congestive heart failure. Bilateral pneumonia cannot be excluded. No pleural effusion or pneumothorax .  IMPRESSION: Persistent cardiomegaly with bilateral pulmonary alveolar infiltrates consistent with congestive heart failure. Bilateral pneumonia  cannot be excluded. No interim change.   Electronically Signed   By: Marcello Moores  Register   On: 12/05/2014 07:34   ASSESSMENT AND PLAN:  * Acute on chronic respiratory failure with hypoxia -worsening At baseline 2 L oxygen nasal cannula at home. Secondary to CHF exacerbation  CT scan of the chest without contrast showed b/l pl  effusions and pulm edema Doppler studies for lower extremities - to r/o DVTs. Chest x-ray repeated today shows no significant improvement. Discussed with Dr. Leonidas Romberg. Started on Steroids.  * Acute diastolic congestive heart failure with pulmonary hypertension  Monitor creatinine. Appreciate cardiology input. Daily Input output. Check daily weights. Lasix held due to worsening ARF  * ARF over Chronic Kidney Disease stage IV with nephrotic range proteinuria - worsening Monitor creatinine. She will likely need dialysis in the near future Nephrology following patient.  * Recent pneumonia Currently there is no need for antibiotics. Afebrile. Normal WBC.  * Hypertension Continue amlodipine and hydralazine furosemide.  * Diabetes Continue glimepiride and Levemir, on insulin sliding scale coverage.  All the records are reviewed and case discussed with Care Management/Social Worker. Management plans discussed with the patient, family and they are in agreement.  CODE STATUS: Full Code  TOTAL Critical care  TIME TAKING CARE OF THIS PATIENT: 35 minutes.   Discussed with daughter at bedside in detail. She would like patient transferred to [facility] tomorrow if there is no improvement to steroids.  Hillary Bow R M.D on 12/05/2014 at 12:42 PM  Between 7am to 6pm - Pager - [phone]  After 6pm go to [email or web address] - password [email or web address] [place] [facility]  Office  [phone]  CC:  Primary care physician; Lavera Guise, MD

## 2014-12-05 NOTE — Progress Notes (Signed)
Still requiring intermittent BiPAP. On NRB mask when off BiPAP. Desaturates readily off O2. Daughter expresses frusration that pt is not improving  Filed Vitals:   12/05/14 0600 12/05/14 0700 12/05/14 0800 12/05/14 1103  BP: 146/53 104/83 147/49 138/50  Pulse: 77 84 80 80  Temp:   98.8 F (37.1 C)   TempSrc:   Axillary   Resp: _0 Height:      Weight:      SpO2: 100% 100% 100%    NAD JVP cannot be visualized Bibasilar crackles, no wheezes Reg, no M Obese, soft, NT No C/C/E  BMP Latest Ref Rng 12/05/2014 12/04/2014 12/03/2014  Glucose 65 - 99 mg/dL 283(H) 173(H) 232(H)  BUN 6 - 20 mg/dL 57(H) 50(H) 43(H)  Creatinine 0.44 - 1.00 mg/dL 3.91(H) 3.48(H) 3.55(H)  Sodium 135 - 145 mmol/L 132(L) 133(L) 134(L)  Potassium 3.5 - 5.1 mmol/L 4.3 4.1 4.4  Chloride 101 - 111 mmol/L 90(L) 91(L) 94(L)  CO2 22 - 32 mmol/L _1 Calcium 8.9 - 10.3 mg/dL 8.2(L) 8.5(L) 8.4(L)    CBC Latest Ref Rng 12/04/2014 12/02/2014 12/01/2014  WBC 3.6 - 11.0 K/uL 9.8 10.0 8.2  Hemoglobin 12.0 - 16.0 g/dL 8.0(L) 8.3(L) 7.6(L)  Hematocrit 35.0 - 47.0 % 23.3(L) 23.6(L) 22.7(L)  Platelets 150 - 440 K/uL 219 167 147(L)    CXR: [place] edema pattern  IMPRESSION: Acute hypoxic respiratory failure  Suspected pulmonary edema but not improving with diuresis - consider alternative dx's Diastolic CHF CKD, AKI - nonoliguric. Worsening Cr. Lasix DC'd today by Renal Service  Probably not a candidate for long term HD  PLAN: Cont in SDU Check PCT, ESR, BNP Empiric trial of methylprednisolone Cont PRN BiPAP Change bronchodilators to PRN Cont supplemental O2 to maintain SpO2 90-96% Avoid nephrotoxins Daughter updated in detail  Merton Border, MD PCCM service Mobile [phone] Pager [phone]

## 2014-12-05 NOTE — Progress Notes (Signed)
Called Dr. Darvin Neighbours per daughter's request to have patient transferred to [facility]. He stated he will start paperwork process. Daughter at bedside and informed of possible wait anywhere of 2-4 days.

## 2014-12-05 NOTE — Discharge Summary (Signed)
[place] [facility] [place] NAME: Rosabell Geyer    MR#:  [id number]  DATE OF BIRTH:  1945-10-02  DATE OF ADMISSION:  11/30/2014 ADMITTING PHYSICIAN: Vaughan Basta, MD  DATE OF DISCHARGE: No discharge date for patient encounter.  PRIMARY CARE PHYSICIAN: Lavera Guise, MD    ADMISSION DIAGNOSIS:  Edema [R60.9] Dyspnea [R06.00]  DISCHARGE DIAGNOSIS:  Principal Problem:   Acute on chronic respiratory failure with hypoxia (HCC) Active Problems:   Acute on chronic respiratory failure with hypercapnia (HCC)   Pressure ulcer   SECONDARY DIAGNOSIS:   Past Medical History  Diagnosis Date  . Hypertension   . Diabetes mellitus without complication ([place])   . TIA (transient ischemic attack)   . Cataract   . Stroke ([place])   . Dizziness   . Anemia   . Pneumonia      ADMITTING HISTORY  HISTORY OF PRESENT ILLNESS: Kuulei Kleier is a 69 y.o. female with a known history of hypertension, diabetes, stroke, pneumonia, anemia- was admitted to hospital 2 weeks ago and then complicated course because of pneumonia and had intubation also found to have chronic renal failure and diastolic heart failure was discharged home with oxygen 10 days ago. Patient's son and daughter present in the room in emergency room as per them initially for few days she was doing okay but then gradually she again started feeling worse and she is feeling excessive short of breath with walking chest 2-3 steps to her bedside commode. They spoke to her primary care doctor and she gave her azithromycin prescription but she did not had any change in her condition so finally decided to think her to emergency room. As per them are oxygen level was dropping of 75 and 80. On further questioning they denies her having any worsening in her cough or sputum production, no fever. They agreed that since discharge from hospital she has been sleeping in her recliner only and she does not go  to her bed, they also noted some swelling on her legs and she just have minimal activity at home otherwise just is seated in her couch. In ER noted to have hypoxia, but her white cell count is not elevated. Chest x-ray is nonconclusive with some questionable pneumonia was edema.   HOSPITAL COURSE:  32 f with HTN, DM, Diastolic chf here with SOB and found to have CHF on CXR  * Acute on chronic respiratory failure with hypoxia -worsening At baseline 2 L oxygen nasal cannula at home. Secondary to CHF exacerbation CT scan of the chest without contrast showed b/l pl effusions and pulm edema. Doppler studies for lower extremities - to r/o DVTs. - No DVT Chest x-ray repeated today shows no significant improvement. Discussed with Dr. Leonidas Romberg of PCCM. Started on IV steroids for possible postinfectious pneumonitis.   * Acute diastolic congestive heart failure with pulmonary hypertension - EF 55%  Monitor creatinine. Appreciate cardiology input. Daily Input output. Check daily weights. Lasix was at 40mg  IV TID and reduced to BID on 12/04/2014. Held 12/05/2014 due to worsening ARF  * ARF over Chronic Kidney Disease stage IV with nephrotic range proteinuria - worsening Monitor creatinine. She will likely need dialysis in the near future if any worsening Nephrology following patient.  * Recent pneumonia Currently there is no need for antibiotics. Afebrile. Normal WBC.  * Hypertension Continue amlodipine and hydralazine furosemide.  * Diabetes Continue glimepiride and Levemir, on insulin sliding scale coverage.  Patient was initially  admitted to the telemetry floor. Due to worsening respiratory status was placed on a BiPAP and transferred to ICU on 12/02/2014. Patient is being followed by nephrology, critical care, cardiology. Not needing any pressor support. And presently is critically ill. Needing BiPAP when necessary. Switching between 6 L oxygen to BiPAP. On discussing with  daughter at bedside [facility] transfer was requested. Discussed case with Dr. Gareth Eagle of pulmonary critical care and patient was accepted to the ICU bed. Will transfer when bed available.   CONSULTS OBTAINED:  Treatment Team:  Teodoro Spray, MD Lavonia Dana, MD Yolonda Kida, MD Flora Lipps, MD  DRUG ALLERGIES:   Allergies  Allergen Reactions  . Codeine Nausea Only  . Prednisone Other (See Comments)    Pts daughter states that this medication "makes her evil".      DISCHARGE MEDICATIONS:   Current Discharge Medication List    CONTINUE these medications which have NOT CHANGED   Details  amLODipine (NORVASC) 5 MG tablet Take 5 mg by mouth at bedtime.    aspirin EC 81 MG tablet Take 81 mg by mouth daily.    atorvastatin (LIPITOR) 10 MG tablet Take 10 mg by mouth at bedtime.    azithromycin (ZITHROMAX) 250 MG tablet Take by mouth See admin instructions. 500 mg on day 1, then 1 tablet daily for 9 days    benzonatate (TESSALON) 200 MG capsule Take 200 mg by mouth 3 (three) times daily as needed for cough.    budesonide (PULMICORT) 0.25 MG/2ML nebulizer solution Take 2 mLs (0.25 mg total) by nebulization 2 (two) times daily. Qty: 60 mL, Refills: 0    citalopram (CELEXA) 40 MG tablet Take 40 mg by mouth every evening.    clopidogrel (PLAVIX) 75 MG tablet Take 75 mg by mouth at bedtime.     donepezil (ARICEPT) 5 MG tablet Take 5 mg by mouth daily.     furosemide (LASIX) 20 MG tablet Take 20 mg by mouth daily.    glimepiride (AMARYL) 4 MG tablet Take 2 mg by mouth 2 (two) times daily.    hydrALAZINE (APRESOLINE) 25 MG tablet Take 50 mg by mouth 3 (three) times daily.    insulin detemir (LEVEMIR) 100 UNIT/ML injection Inject 30-35 Units into the skin every evening.    ipratropium-albuterol (DUONEB) 0.5-2.5 (3) MG/3ML SOLN Take 3 mLs by nebulization every 6 (six) hours as needed. Qty: 300 mL, Refills: 0    isosorbide mononitrate (IMDUR) 60 MG 24 hr tablet Take 90 mg by  mouth 2 (two) times daily.    Liraglutide (VICTOZA) 18 MG/3ML SOPN Inject 1.8 mg into the skin daily.    ondansetron (ZOFRAN) 4 MG tablet Take 4 mg by mouth 3 (three) times daily as needed for nausea or vomiting.    polycarbophil (FIBERCON) 625 MG tablet Take 625 mg by mouth daily.    vitamin B-12 (CYANOCOBALAMIN) 1000 MCG tablet Take 1,000 mcg by mouth daily.    albuterol (PROVENTIL HFA;VENTOLIN HFA) 108 (90 BASE) MCG/ACT inhaler Inhale 2 puffs into the lungs every 6 (six) hours as needed for wheezing or shortness of breath. Qty: 1 Inhaler, Refills: 2    guaiFENesin (MUCINEX) 600 MG 12 hr tablet Take 1 tablet (600 mg total) by mouth 2 (two) times daily. Qty: 20 tablet, Refills: 0    guaiFENesin-dextromethorphan (ROBITUSSIN DM) 100-10 MG/5ML syrup Take 10 mLs by mouth every 6 (six) hours as needed for cough. Qty: 118 mL, Refills: 0  Today    VITAL SIGNS:  Blood pressure 138/50, pulse 80, temperature 98.8 F (37.1 C), temperature source Axillary, resp. rate 23, height 5\' 1"  (1.549 m), weight 80.5 kg (177 lb 7.5 oz), SpO2 100 %.  I/O:   Intake/Output Summary (Last 24 hours) at 12/05/14 1425 Last data filed at 12/05/14 1106  Gross per 24 hour  Intake    246 ml  Output   1500 ml  Net  -1254 ml    PHYSICAL EXAMINATION:  Physical Exam  GENERAL:  69 y.o.-year-old patient lying in the bed with respiratory distress. LUNGS: Bilateral crackles and wheezing. Poor air entry on both sides. CARDIOVASCULAR: S1, S2 normal. No murmurs, rubs, or gallops.  ABDOMEN: Soft, non-tender, non-distended. Bowel sounds present. No organomegaly or mass.  NEUROLOGIC: Moves all 4 extremities. PSYCHIATRIC: The patient is awake. SKIN: No obvious rash, lesion, or ulcer.   DATA REVIEW:   CBC  Recent Labs Lab 12/04/14 0352  WBC 9.8  HGB 8.0*  HCT 23.3*  PLT 219    Chemistries   Recent Labs Lab 11/30/14 1655  12/05/14 0411  NA 134*  < > 132*  K 5.0  < > 4.3  CL 96*  < > 90*   CO2 29  < > 31  GLUCOSE 245*  < > 283*  BUN 37*  < > 57*  CREATININE 3.18*  < > 3.91*  CALCIUM 8.7*  < > 8.2*  AST 12*  --   --   ALT 12*  --   --   ALKPHOS 81  --   --   BILITOT 0.6  --   --   < > = values in this interval not displayed.  Cardiac Enzymes  Recent Labs Lab 11/30/14 1655  TROPONINI <0.03    Microbiology Results  Results for orders placed or performed during the hospital encounter of 11/30/14  Blood culture (routine x 2)     Status: None (Preliminary result)   Collection Time: 11/30/14  6:04 PM  Result Value Ref Range Status   Specimen Description BLOOD RIGHT AC  Final   Special Requests BOTTLES DRAWN AEROBIC AND ANAEROBIC  6CC  Final   Culture NO GROWTH 4 DAYS  Final   Report Status PENDING  Incomplete  Blood culture (routine x 2)     Status: None (Preliminary result)   Collection Time: 11/30/14  6:11 PM  Result Value Ref Range Status   Specimen Description BLOOD LEFT AC  Final   Special Requests BOTTLES DRAWN AEROBIC AND ANAEROBIC  1CC  Final   Culture NO GROWTH 4 DAYS  Final   Report Status PENDING  Incomplete    RADIOLOGY:  Dg Chest Port 1 View  12/05/2014   CLINICAL DATA:  Congestive heart failure.  EXAM: PORTABLE CHEST 1 VIEW  COMPARISON:  12/04/2014 .  FINDINGS: Persistent cardiomegaly and bilateral pulmonary infiltrates, no interim change from prior exam. Findings consistent persistent congestive heart failure. Bilateral pneumonia cannot be excluded. No pleural effusion or pneumothorax .  IMPRESSION: Persistent cardiomegaly with bilateral pulmonary alveolar infiltrates consistent with congestive heart failure. Bilateral pneumonia cannot be excluded. No interim change.   Electronically Signed   By: Marcello Moores  Register   On: 12/05/2014 07:34   Dg Chest Port 1 View  12/04/2014   CLINICAL DATA:  CHF .  EXAM: PORTABLE CHEST 1 VIEW  COMPARISON:  12/02/2014.  FINDINGS: Cardiomegaly with pulmonary vascular prominence and bilateral interstitial prominence  consistent congestive heart failure. Slight improvement from prior exam .  Again bilateral pneumonia cannot be excluded. No prominent pleural effusion or pneumothorax .  IMPRESSION: Cardiomegaly with diffuse bilateral pulmonary alveolar infiltrates consistent with congestive heart failure and pulmonary edema. Partial interim clearing from prior exam.   Electronically Signed   By: Amelia   On: 12/04/2014 10:54      Follow up with PCP in 1 week.  Management plans discussed with the patient, family and they are in agreement.  CODE STATUS:     Code Status Orders        Start     Ordered   11/30/14 2130  Full code   Continuous     11/30/14 2129      Additional time spent on transfer coordination and this note 40 minutes.    Hillary Bow R M.D on 12/05/2014 at 2:25 PM  Between 7am to 6pm - Pager - [phone]  After 6pm go to [email or web address] - password [email or web address] [place] [facility]  Office  [phone]  CC: Primary care physician; Lavera Guise, MD     Note: This dictation was prepared with Dragon dictation along with smaller phrase technology. Any transcriptional errors that result from this process are unintentional.

## 2014-12-06 ENCOUNTER — Encounter: Payer: Self-pay | Admitting: *Deleted

## 2014-12-06 ENCOUNTER — Ambulatory Visit (HOSPITAL_COMMUNITY)
Admission: AD | Admit: 2014-12-06 | Discharge: 2014-12-06 | Disposition: A | Discharge status: Home / Self Care | Payer: Medicare Other | Source: Other Acute Inpatient Hospital | Attending: Internal Medicine | Admitting: Internal Medicine

## 2014-12-06 DIAGNOSIS — R195 Other fecal abnormalities: Secondary | ICD-10-CM | POA: Diagnosis not present

## 2014-12-06 DIAGNOSIS — J9601 Acute respiratory failure with hypoxia: Secondary | ICD-10-CM | POA: Diagnosis not present

## 2014-12-06 DIAGNOSIS — R918 Other nonspecific abnormal finding of lung field: Secondary | ICD-10-CM | POA: Diagnosis not present

## 2014-12-06 DIAGNOSIS — R93422 Abnormal radiologic findings on diagnostic imaging of left kidney: Secondary | ICD-10-CM | POA: Diagnosis not present

## 2014-12-06 DIAGNOSIS — I509 Heart failure, unspecified: Secondary | ICD-10-CM | POA: Diagnosis not present

## 2014-12-06 DIAGNOSIS — Z7722 Contact with and (suspected) exposure to environmental tobacco smoke (acute) (chronic): Secondary | ICD-10-CM | POA: Diagnosis not present

## 2014-12-06 DIAGNOSIS — I493 Ventricular premature depolarization: Secondary | ICD-10-CM | POA: Diagnosis not present

## 2014-12-06 DIAGNOSIS — E1122 Type 2 diabetes mellitus with diabetic chronic kidney disease: Secondary | ICD-10-CM | POA: Diagnosis not present

## 2014-12-06 DIAGNOSIS — K219 Gastro-esophageal reflux disease without esophagitis: Secondary | ICD-10-CM | POA: Diagnosis not present

## 2014-12-06 DIAGNOSIS — D519 Vitamin B12 deficiency anemia, unspecified: Secondary | ICD-10-CM | POA: Diagnosis not present

## 2014-12-06 DIAGNOSIS — Z7982 Long term (current) use of aspirin: Secondary | ICD-10-CM | POA: Diagnosis not present

## 2014-12-06 DIAGNOSIS — J849 Interstitial pulmonary disease, unspecified: Secondary | ICD-10-CM | POA: Diagnosis not present

## 2014-12-06 DIAGNOSIS — Z7902 Long term (current) use of antithrombotics/antiplatelets: Secondary | ICD-10-CM | POA: Diagnosis not present

## 2014-12-06 DIAGNOSIS — I272 Other secondary pulmonary hypertension: Secondary | ICD-10-CM | POA: Diagnosis not present

## 2014-12-06 DIAGNOSIS — Z794 Long term (current) use of insulin: Secondary | ICD-10-CM | POA: Diagnosis not present

## 2014-12-06 DIAGNOSIS — Z888 Allergy status to other drugs, medicaments and biological substances status: Secondary | ICD-10-CM | POA: Diagnosis not present

## 2014-12-06 DIAGNOSIS — I517 Cardiomegaly: Secondary | ICD-10-CM | POA: Diagnosis not present

## 2014-12-06 DIAGNOSIS — Z8673 Personal history of transient ischemic attack (TIA), and cerebral infarction without residual deficits: Secondary | ICD-10-CM | POA: Diagnosis not present

## 2014-12-06 DIAGNOSIS — R1312 Dysphagia, oropharyngeal phase: Secondary | ICD-10-CM | POA: Diagnosis not present

## 2014-12-06 DIAGNOSIS — I5031 Acute diastolic (congestive) heart failure: Secondary | ICD-10-CM | POA: Diagnosis not present

## 2014-12-06 DIAGNOSIS — E1129 Type 2 diabetes mellitus with other diabetic kidney complication: Secondary | ICD-10-CM | POA: Diagnosis not present

## 2014-12-06 DIAGNOSIS — I130 Hypertensive heart and chronic kidney disease with heart failure and stage 1 through stage 4 chronic kidney disease, or unspecified chronic kidney disease: Secondary | ICD-10-CM | POA: Diagnosis not present

## 2014-12-06 DIAGNOSIS — J9621 Acute and chronic respiratory failure with hypoxia: Secondary | ICD-10-CM | POA: Diagnosis not present

## 2014-12-06 DIAGNOSIS — R0600 Dyspnea, unspecified: Secondary | ICD-10-CM | POA: Diagnosis not present

## 2014-12-06 DIAGNOSIS — N184 Chronic kidney disease, stage 4 (severe): Secondary | ICD-10-CM | POA: Diagnosis not present

## 2014-12-06 DIAGNOSIS — D631 Anemia in chronic kidney disease: Secondary | ICD-10-CM | POA: Diagnosis not present

## 2014-12-06 DIAGNOSIS — J8489 Other specified interstitial pulmonary diseases: Secondary | ICD-10-CM | POA: Diagnosis not present

## 2014-12-06 DIAGNOSIS — I10 Essential (primary) hypertension: Secondary | ICD-10-CM | POA: Diagnosis not present

## 2014-12-06 DIAGNOSIS — J9622 Acute and chronic respiratory failure with hypercapnia: Secondary | ICD-10-CM | POA: Diagnosis not present

## 2014-12-06 DIAGNOSIS — N179 Acute kidney failure, unspecified: Secondary | ICD-10-CM | POA: Diagnosis not present

## 2014-12-06 DIAGNOSIS — E785 Hyperlipidemia, unspecified: Secondary | ICD-10-CM | POA: Diagnosis not present

## 2014-12-06 DIAGNOSIS — R609 Edema, unspecified: Secondary | ICD-10-CM | POA: Diagnosis not present

## 2014-12-06 DIAGNOSIS — E861 Hypovolemia: Secondary | ICD-10-CM | POA: Diagnosis not present

## 2014-12-06 DIAGNOSIS — J189 Pneumonia, unspecified organism: Secondary | ICD-10-CM | POA: Diagnosis not present

## 2014-12-06 DIAGNOSIS — J9690 Respiratory failure, unspecified, unspecified whether with hypoxia or hypercapnia: Secondary | ICD-10-CM | POA: Diagnosis not present

## 2014-12-06 DIAGNOSIS — N390 Urinary tract infection, site not specified: Secondary | ICD-10-CM | POA: Diagnosis not present

## 2014-12-06 DIAGNOSIS — E1165 Type 2 diabetes mellitus with hyperglycemia: Secondary | ICD-10-CM | POA: Diagnosis not present

## 2014-12-06 DIAGNOSIS — R93421 Abnormal radiologic findings on diagnostic imaging of right kidney: Secondary | ICD-10-CM | POA: Diagnosis not present

## 2014-12-06 DIAGNOSIS — N189 Chronic kidney disease, unspecified: Secondary | ICD-10-CM | POA: Diagnosis not present

## 2014-12-06 DIAGNOSIS — R0902 Hypoxemia: Secondary | ICD-10-CM | POA: Diagnosis not present

## 2014-12-06 DIAGNOSIS — K922 Gastrointestinal hemorrhage, unspecified: Secondary | ICD-10-CM | POA: Diagnosis not present

## 2014-12-06 DIAGNOSIS — F339 Major depressive disorder, recurrent, unspecified: Secondary | ICD-10-CM | POA: Diagnosis not present

## 2014-12-06 DIAGNOSIS — D72829 Elevated white blood cell count, unspecified: Secondary | ICD-10-CM | POA: Diagnosis not present

## 2014-12-06 DIAGNOSIS — I081 Rheumatic disorders of both mitral and tricuspid valves: Secondary | ICD-10-CM | POA: Diagnosis not present

## 2014-12-06 DIAGNOSIS — Z9119 Patient's noncompliance with other medical treatment and regimen: Secondary | ICD-10-CM | POA: Diagnosis not present

## 2014-12-06 DIAGNOSIS — J9602 Acute respiratory failure with hypercapnia: Secondary | ICD-10-CM | POA: Diagnosis not present

## 2014-12-06 DIAGNOSIS — E119 Type 2 diabetes mellitus without complications: Secondary | ICD-10-CM | POA: Diagnosis not present

## 2014-12-06 DIAGNOSIS — J90 Pleural effusion, not elsewhere classified: Secondary | ICD-10-CM | POA: Diagnosis not present

## 2014-12-06 DIAGNOSIS — I4891 Unspecified atrial fibrillation: Secondary | ICD-10-CM | POA: Diagnosis not present

## 2014-12-06 DIAGNOSIS — J449 Chronic obstructive pulmonary disease, unspecified: Secondary | ICD-10-CM | POA: Diagnosis not present

## 2014-12-06 DIAGNOSIS — Z9981 Dependence on supplemental oxygen: Secondary | ICD-10-CM | POA: Diagnosis not present

## 2014-12-06 DIAGNOSIS — I340 Nonrheumatic mitral (valve) insufficiency: Secondary | ICD-10-CM | POA: Diagnosis not present

## 2014-12-06 DIAGNOSIS — D649 Anemia, unspecified: Secondary | ICD-10-CM | POA: Diagnosis not present

## 2014-12-06 LAB — CULTURE, BLOOD (ROUTINE X 2)
CULTURE: NO GROWTH
CULTURE: NO GROWTH

## 2014-12-06 NOTE — Patient Outreach (Signed)
[name] [facility]) Care Management  18/28/8337  Felicia Vincent [birth year] [id number]   High Risk [facility] patient was readmitted last week and was transferred to [facility] early this am.  I will request a social worker to follow pt progress and request notification when pt is discharged or placed.  [name] [name] [phone]

## 2014-12-07 ENCOUNTER — Other Ambulatory Visit: Payer: Self-pay | Admitting: Pharmacist

## 2014-12-07 NOTE — Patient Outreach (Signed)
[facility]) Care Management  29/93/7169  Felicia Vincent [birth year] [id number]   Felicia Vincent is a 69 yo referred to [place] from [name], [place] with [facility]. I planned to make a third outreach call to Felicia Vincent's daughter Felicia Vincent who manages the patient's medications and fills weekly pill boxes for her mother. Patient has gaven me permission to call her daughter to review her medications. I completed a chart review and noted that the patient was transferred to [facility] on 12/06/14, and [place] has requested [facility] social work to follow patient's progress and request notification when patient is discharged or plan.  I will request for [facility] social worker to notify me of discharge or placement disposition as well.     Elisabeth Most, Pharm.D. Pharmacy Resident [place] [phone]

## 2014-12-08 ENCOUNTER — Ambulatory Visit: Payer: Medicare Other | Admitting: Family

## 2014-12-09 LAB — TYPE AND SCREEN
ABO/RH(D): A POS
Antibody Screen: POSITIVE
DAT, IgG: NEGATIVE
DAT, complement: POSITIVE
Unit division: 0

## 2014-12-15 DIAGNOSIS — N189 Chronic kidney disease, unspecified: Secondary | ICD-10-CM | POA: Diagnosis not present

## 2014-12-15 DIAGNOSIS — F339 Major depressive disorder, recurrent, unspecified: Secondary | ICD-10-CM | POA: Diagnosis not present

## 2014-12-15 DIAGNOSIS — I10 Essential (primary) hypertension: Secondary | ICD-10-CM | POA: Diagnosis not present

## 2014-12-15 DIAGNOSIS — R609 Edema, unspecified: Secondary | ICD-10-CM | POA: Diagnosis not present

## 2014-12-15 DIAGNOSIS — R918 Other nonspecific abnormal finding of lung field: Secondary | ICD-10-CM | POA: Diagnosis not present

## 2014-12-15 DIAGNOSIS — Z9981 Dependence on supplemental oxygen: Secondary | ICD-10-CM | POA: Diagnosis not present

## 2014-12-15 DIAGNOSIS — D649 Anemia, unspecified: Secondary | ICD-10-CM | POA: Diagnosis not present

## 2014-12-15 DIAGNOSIS — D72829 Elevated white blood cell count, unspecified: Secondary | ICD-10-CM | POA: Diagnosis not present

## 2014-12-15 DIAGNOSIS — E119 Type 2 diabetes mellitus without complications: Secondary | ICD-10-CM | POA: Diagnosis not present

## 2014-12-15 DIAGNOSIS — Z79899 Other long term (current) drug therapy: Secondary | ICD-10-CM | POA: Diagnosis not present

## 2014-12-15 DIAGNOSIS — E1122 Type 2 diabetes mellitus with diabetic chronic kidney disease: Secondary | ICD-10-CM | POA: Diagnosis not present

## 2014-12-15 DIAGNOSIS — N179 Acute kidney failure, unspecified: Secondary | ICD-10-CM | POA: Diagnosis not present

## 2014-12-15 DIAGNOSIS — Z7902 Long term (current) use of antithrombotics/antiplatelets: Secondary | ICD-10-CM | POA: Diagnosis not present

## 2014-12-15 DIAGNOSIS — N184 Chronic kidney disease, stage 4 (severe): Secondary | ICD-10-CM | POA: Diagnosis not present

## 2014-12-15 DIAGNOSIS — Z7982 Long term (current) use of aspirin: Secondary | ICD-10-CM | POA: Diagnosis not present

## 2014-12-15 DIAGNOSIS — I5040 Unspecified combined systolic (congestive) and diastolic (congestive) heart failure: Secondary | ICD-10-CM | POA: Diagnosis not present

## 2014-12-15 DIAGNOSIS — D631 Anemia in chronic kidney disease: Secondary | ICD-10-CM | POA: Diagnosis not present

## 2014-12-15 DIAGNOSIS — Z8673 Personal history of transient ischemic attack (TIA), and cerebral infarction without residual deficits: Secondary | ICD-10-CM | POA: Diagnosis not present

## 2014-12-15 DIAGNOSIS — N390 Urinary tract infection, site not specified: Secondary | ICD-10-CM | POA: Diagnosis not present

## 2014-12-15 DIAGNOSIS — K449 Diaphragmatic hernia without obstruction or gangrene: Secondary | ICD-10-CM | POA: Diagnosis not present

## 2014-12-15 DIAGNOSIS — L89151 Pressure ulcer of sacral region, stage 1: Secondary | ICD-10-CM | POA: Diagnosis not present

## 2014-12-15 DIAGNOSIS — D62 Acute posthemorrhagic anemia: Secondary | ICD-10-CM | POA: Diagnosis not present

## 2014-12-15 DIAGNOSIS — E785 Hyperlipidemia, unspecified: Secondary | ICD-10-CM | POA: Diagnosis not present

## 2014-12-15 DIAGNOSIS — I5031 Acute diastolic (congestive) heart failure: Secondary | ICD-10-CM | POA: Diagnosis not present

## 2014-12-15 DIAGNOSIS — D500 Iron deficiency anemia secondary to blood loss (chronic): Secondary | ICD-10-CM | POA: Diagnosis not present

## 2014-12-15 DIAGNOSIS — R1312 Dysphagia, oropharyngeal phase: Secondary | ICD-10-CM | POA: Diagnosis not present

## 2014-12-15 DIAGNOSIS — R040 Epistaxis: Secondary | ICD-10-CM | POA: Diagnosis not present

## 2014-12-15 DIAGNOSIS — J449 Chronic obstructive pulmonary disease, unspecified: Secondary | ICD-10-CM | POA: Diagnosis not present

## 2014-12-15 DIAGNOSIS — E039 Hypothyroidism, unspecified: Secondary | ICD-10-CM | POA: Diagnosis not present

## 2014-12-15 DIAGNOSIS — I509 Heart failure, unspecified: Secondary | ICD-10-CM | POA: Diagnosis not present

## 2014-12-15 DIAGNOSIS — E1129 Type 2 diabetes mellitus with other diabetic kidney complication: Secondary | ICD-10-CM | POA: Diagnosis not present

## 2014-12-15 DIAGNOSIS — I272 Other secondary pulmonary hypertension: Secondary | ICD-10-CM | POA: Diagnosis not present

## 2014-12-15 DIAGNOSIS — T45515A Adverse effect of anticoagulants, initial encounter: Secondary | ICD-10-CM | POA: Diagnosis not present

## 2014-12-15 DIAGNOSIS — I129 Hypertensive chronic kidney disease with stage 1 through stage 4 chronic kidney disease, or unspecified chronic kidney disease: Secondary | ICD-10-CM | POA: Diagnosis not present

## 2014-12-15 DIAGNOSIS — I349 Nonrheumatic mitral valve disorder, unspecified: Secondary | ICD-10-CM | POA: Diagnosis not present

## 2014-12-15 DIAGNOSIS — D519 Vitamin B12 deficiency anemia, unspecified: Secondary | ICD-10-CM | POA: Diagnosis not present

## 2014-12-15 DIAGNOSIS — Z794 Long term (current) use of insulin: Secondary | ICD-10-CM | POA: Diagnosis not present

## 2014-12-15 DIAGNOSIS — E871 Hypo-osmolality and hyponatremia: Secondary | ICD-10-CM | POA: Diagnosis not present

## 2014-12-15 DIAGNOSIS — K208 Other esophagitis: Secondary | ICD-10-CM | POA: Diagnosis not present

## 2014-12-15 DIAGNOSIS — N183 Chronic kidney disease, stage 3 (moderate): Secondary | ICD-10-CM | POA: Diagnosis not present

## 2014-12-15 DIAGNOSIS — Z7952 Long term (current) use of systemic steroids: Secondary | ICD-10-CM | POA: Diagnosis not present

## 2014-12-15 DIAGNOSIS — K219 Gastro-esophageal reflux disease without esophagitis: Secondary | ICD-10-CM | POA: Diagnosis not present

## 2014-12-15 DIAGNOSIS — R05 Cough: Secondary | ICD-10-CM | POA: Diagnosis not present

## 2014-12-15 DIAGNOSIS — K2211 Ulcer of esophagus with bleeding: Secondary | ICD-10-CM | POA: Diagnosis not present

## 2014-12-15 DIAGNOSIS — J8489 Other specified interstitial pulmonary diseases: Secondary | ICD-10-CM | POA: Diagnosis not present

## 2014-12-15 DIAGNOSIS — J189 Pneumonia, unspecified organism: Secondary | ICD-10-CM | POA: Diagnosis not present

## 2014-12-15 DIAGNOSIS — J9611 Chronic respiratory failure with hypoxia: Secondary | ICD-10-CM | POA: Diagnosis not present

## 2014-12-18 DIAGNOSIS — N189 Chronic kidney disease, unspecified: Secondary | ICD-10-CM | POA: Diagnosis not present

## 2014-12-18 DIAGNOSIS — K208 Other esophagitis: Secondary | ICD-10-CM | POA: Diagnosis not present

## 2014-12-18 DIAGNOSIS — Z79899 Other long term (current) drug therapy: Secondary | ICD-10-CM | POA: Diagnosis not present

## 2014-12-18 DIAGNOSIS — I349 Nonrheumatic mitral valve disorder, unspecified: Secondary | ICD-10-CM | POA: Diagnosis not present

## 2014-12-18 DIAGNOSIS — Z7902 Long term (current) use of antithrombotics/antiplatelets: Secondary | ICD-10-CM | POA: Diagnosis not present

## 2014-12-18 DIAGNOSIS — D62 Acute posthemorrhagic anemia: Secondary | ICD-10-CM | POA: Diagnosis not present

## 2014-12-18 DIAGNOSIS — R918 Other nonspecific abnormal finding of lung field: Secondary | ICD-10-CM | POA: Diagnosis not present

## 2014-12-18 DIAGNOSIS — N184 Chronic kidney disease, stage 4 (severe): Secondary | ICD-10-CM | POA: Diagnosis not present

## 2014-12-18 DIAGNOSIS — I5031 Acute diastolic (congestive) heart failure: Secondary | ICD-10-CM | POA: Diagnosis not present

## 2014-12-18 DIAGNOSIS — Z8673 Personal history of transient ischemic attack (TIA), and cerebral infarction without residual deficits: Secondary | ICD-10-CM | POA: Diagnosis not present

## 2014-12-18 DIAGNOSIS — Z794 Long term (current) use of insulin: Secondary | ICD-10-CM | POA: Diagnosis not present

## 2014-12-18 DIAGNOSIS — E1129 Type 2 diabetes mellitus with other diabetic kidney complication: Secondary | ICD-10-CM | POA: Diagnosis not present

## 2014-12-18 DIAGNOSIS — R05 Cough: Secondary | ICD-10-CM | POA: Diagnosis not present

## 2014-12-18 DIAGNOSIS — R040 Epistaxis: Secondary | ICD-10-CM | POA: Diagnosis not present

## 2014-12-18 DIAGNOSIS — J8489 Other specified interstitial pulmonary diseases: Secondary | ICD-10-CM | POA: Diagnosis not present

## 2014-12-18 DIAGNOSIS — M6281 Muscle weakness (generalized): Secondary | ICD-10-CM | POA: Diagnosis not present

## 2014-12-18 DIAGNOSIS — E119 Type 2 diabetes mellitus without complications: Secondary | ICD-10-CM | POA: Diagnosis not present

## 2014-12-18 DIAGNOSIS — E785 Hyperlipidemia, unspecified: Secondary | ICD-10-CM | POA: Diagnosis not present

## 2014-12-18 DIAGNOSIS — R262 Difficulty in walking, not elsewhere classified: Secondary | ICD-10-CM | POA: Diagnosis not present

## 2014-12-18 DIAGNOSIS — N179 Acute kidney failure, unspecified: Secondary | ICD-10-CM | POA: Diagnosis not present

## 2014-12-18 DIAGNOSIS — J9621 Acute and chronic respiratory failure with hypoxia: Secondary | ICD-10-CM | POA: Diagnosis not present

## 2014-12-18 DIAGNOSIS — R000 Tachycardia, unspecified: Secondary | ICD-10-CM | POA: Diagnosis not present

## 2014-12-18 DIAGNOSIS — D72829 Elevated white blood cell count, unspecified: Secondary | ICD-10-CM | POA: Diagnosis not present

## 2014-12-18 DIAGNOSIS — Z9981 Dependence on supplemental oxygen: Secondary | ICD-10-CM | POA: Diagnosis not present

## 2014-12-18 DIAGNOSIS — R0602 Shortness of breath: Secondary | ICD-10-CM | POA: Diagnosis not present

## 2014-12-18 DIAGNOSIS — F339 Major depressive disorder, recurrent, unspecified: Secondary | ICD-10-CM | POA: Diagnosis not present

## 2014-12-18 DIAGNOSIS — R531 Weakness: Secondary | ICD-10-CM | POA: Diagnosis not present

## 2014-12-18 DIAGNOSIS — T45515A Adverse effect of anticoagulants, initial encounter: Secondary | ICD-10-CM | POA: Diagnosis not present

## 2014-12-18 DIAGNOSIS — I509 Heart failure, unspecified: Secondary | ICD-10-CM | POA: Diagnosis not present

## 2014-12-18 DIAGNOSIS — E1122 Type 2 diabetes mellitus with diabetic chronic kidney disease: Secondary | ICD-10-CM | POA: Diagnosis not present

## 2014-12-18 DIAGNOSIS — N183 Chronic kidney disease, stage 3 (moderate): Secondary | ICD-10-CM | POA: Diagnosis not present

## 2014-12-18 DIAGNOSIS — R1312 Dysphagia, oropharyngeal phase: Secondary | ICD-10-CM | POA: Diagnosis not present

## 2014-12-18 DIAGNOSIS — R609 Edema, unspecified: Secondary | ICD-10-CM | POA: Diagnosis not present

## 2014-12-18 DIAGNOSIS — J9611 Chronic respiratory failure with hypoxia: Secondary | ICD-10-CM | POA: Diagnosis not present

## 2014-12-18 DIAGNOSIS — D519 Vitamin B12 deficiency anemia, unspecified: Secondary | ICD-10-CM | POA: Diagnosis not present

## 2014-12-18 DIAGNOSIS — L89151 Pressure ulcer of sacral region, stage 1: Secondary | ICD-10-CM | POA: Diagnosis not present

## 2014-12-18 DIAGNOSIS — N390 Urinary tract infection, site not specified: Secondary | ICD-10-CM | POA: Diagnosis not present

## 2014-12-18 DIAGNOSIS — K921 Melena: Secondary | ICD-10-CM | POA: Diagnosis not present

## 2014-12-18 DIAGNOSIS — D500 Iron deficiency anemia secondary to blood loss (chronic): Secondary | ICD-10-CM | POA: Diagnosis not present

## 2014-12-18 DIAGNOSIS — D649 Anemia, unspecified: Secondary | ICD-10-CM | POA: Diagnosis not present

## 2014-12-18 DIAGNOSIS — K449 Diaphragmatic hernia without obstruction or gangrene: Secondary | ICD-10-CM | POA: Diagnosis not present

## 2014-12-18 DIAGNOSIS — K922 Gastrointestinal hemorrhage, unspecified: Secondary | ICD-10-CM | POA: Diagnosis not present

## 2014-12-18 DIAGNOSIS — K219 Gastro-esophageal reflux disease without esophagitis: Secondary | ICD-10-CM | POA: Diagnosis not present

## 2014-12-18 DIAGNOSIS — Z7952 Long term (current) use of systemic steroids: Secondary | ICD-10-CM | POA: Diagnosis not present

## 2014-12-18 DIAGNOSIS — Z7982 Long term (current) use of aspirin: Secondary | ICD-10-CM | POA: Diagnosis not present

## 2014-12-18 DIAGNOSIS — J189 Pneumonia, unspecified organism: Secondary | ICD-10-CM | POA: Diagnosis not present

## 2014-12-18 DIAGNOSIS — J8410 Pulmonary fibrosis, unspecified: Secondary | ICD-10-CM | POA: Diagnosis not present

## 2014-12-18 DIAGNOSIS — J449 Chronic obstructive pulmonary disease, unspecified: Secondary | ICD-10-CM | POA: Diagnosis not present

## 2014-12-18 DIAGNOSIS — J9690 Respiratory failure, unspecified, unspecified whether with hypoxia or hypercapnia: Secondary | ICD-10-CM | POA: Diagnosis not present

## 2014-12-18 DIAGNOSIS — R1319 Other dysphagia: Secondary | ICD-10-CM | POA: Diagnosis not present

## 2014-12-18 DIAGNOSIS — D631 Anemia in chronic kidney disease: Secondary | ICD-10-CM | POA: Diagnosis not present

## 2014-12-18 DIAGNOSIS — E871 Hypo-osmolality and hyponatremia: Secondary | ICD-10-CM | POA: Diagnosis not present

## 2014-12-18 DIAGNOSIS — E1140 Type 2 diabetes mellitus with diabetic neuropathy, unspecified: Secondary | ICD-10-CM | POA: Diagnosis not present

## 2014-12-18 DIAGNOSIS — E039 Hypothyroidism, unspecified: Secondary | ICD-10-CM | POA: Diagnosis not present

## 2014-12-18 DIAGNOSIS — I272 Other secondary pulmonary hypertension: Secondary | ICD-10-CM | POA: Diagnosis not present

## 2014-12-18 DIAGNOSIS — K2210 Ulcer of esophagus without bleeding: Secondary | ICD-10-CM | POA: Diagnosis not present

## 2014-12-18 DIAGNOSIS — R9431 Abnormal electrocardiogram [ECG] [EKG]: Secondary | ICD-10-CM | POA: Diagnosis not present

## 2014-12-18 DIAGNOSIS — I10 Essential (primary) hypertension: Secondary | ICD-10-CM | POA: Diagnosis not present

## 2014-12-18 DIAGNOSIS — K2211 Ulcer of esophagus with bleeding: Secondary | ICD-10-CM | POA: Diagnosis not present

## 2014-12-18 DIAGNOSIS — I129 Hypertensive chronic kidney disease with stage 1 through stage 4 chronic kidney disease, or unspecified chronic kidney disease: Secondary | ICD-10-CM | POA: Diagnosis not present

## 2014-12-21 DIAGNOSIS — D500 Iron deficiency anemia secondary to blood loss (chronic): Secondary | ICD-10-CM | POA: Diagnosis not present

## 2014-12-27 DIAGNOSIS — D62 Acute posthemorrhagic anemia: Secondary | ICD-10-CM | POA: Diagnosis not present

## 2014-12-27 DIAGNOSIS — E1121 Type 2 diabetes mellitus with diabetic nephropathy: Secondary | ICD-10-CM | POA: Diagnosis not present

## 2014-12-27 DIAGNOSIS — Z8673 Personal history of transient ischemic attack (TIA), and cerebral infarction without residual deficits: Secondary | ICD-10-CM | POA: Diagnosis not present

## 2014-12-27 DIAGNOSIS — R040 Epistaxis: Secondary | ICD-10-CM | POA: Diagnosis not present

## 2014-12-27 DIAGNOSIS — I129 Hypertensive chronic kidney disease with stage 1 through stage 4 chronic kidney disease, or unspecified chronic kidney disease: Secondary | ICD-10-CM | POA: Diagnosis not present

## 2014-12-27 DIAGNOSIS — D500 Iron deficiency anemia secondary to blood loss (chronic): Secondary | ICD-10-CM | POA: Diagnosis not present

## 2014-12-27 DIAGNOSIS — E1140 Type 2 diabetes mellitus with diabetic neuropathy, unspecified: Secondary | ICD-10-CM | POA: Diagnosis not present

## 2014-12-27 DIAGNOSIS — M6281 Muscle weakness (generalized): Secondary | ICD-10-CM | POA: Diagnosis not present

## 2014-12-27 DIAGNOSIS — Z79899 Other long term (current) drug therapy: Secondary | ICD-10-CM | POA: Diagnosis not present

## 2014-12-27 DIAGNOSIS — J9690 Respiratory failure, unspecified, unspecified whether with hypoxia or hypercapnia: Secondary | ICD-10-CM | POA: Diagnosis not present

## 2014-12-27 DIAGNOSIS — D519 Vitamin B12 deficiency anemia, unspecified: Secondary | ICD-10-CM | POA: Diagnosis not present

## 2014-12-27 DIAGNOSIS — I132 Hypertensive heart and chronic kidney disease with heart failure and with stage 5 chronic kidney disease, or end stage renal disease: Secondary | ICD-10-CM | POA: Diagnosis not present

## 2014-12-27 DIAGNOSIS — I5030 Unspecified diastolic (congestive) heart failure: Secondary | ICD-10-CM | POA: Diagnosis not present

## 2014-12-27 DIAGNOSIS — Z515 Encounter for palliative care: Secondary | ICD-10-CM | POA: Diagnosis not present

## 2014-12-27 DIAGNOSIS — R9431 Abnormal electrocardiogram [ECG] [EKG]: Secondary | ICD-10-CM | POA: Diagnosis not present

## 2014-12-27 DIAGNOSIS — J15212 Pneumonia due to Methicillin resistant Staphylococcus aureus: Secondary | ICD-10-CM | POA: Diagnosis not present

## 2014-12-27 DIAGNOSIS — Z885 Allergy status to narcotic agent status: Secondary | ICD-10-CM | POA: Diagnosis not present

## 2014-12-27 DIAGNOSIS — I214 Non-ST elevation (NSTEMI) myocardial infarction: Secondary | ICD-10-CM | POA: Diagnosis not present

## 2014-12-27 DIAGNOSIS — Z794 Long term (current) use of insulin: Secondary | ICD-10-CM | POA: Diagnosis not present

## 2014-12-27 DIAGNOSIS — I272 Other secondary pulmonary hypertension: Secondary | ICD-10-CM | POA: Diagnosis not present

## 2014-12-27 DIAGNOSIS — J449 Chronic obstructive pulmonary disease, unspecified: Secondary | ICD-10-CM | POA: Diagnosis not present

## 2014-12-27 DIAGNOSIS — N179 Acute kidney failure, unspecified: Secondary | ICD-10-CM | POA: Diagnosis not present

## 2014-12-27 DIAGNOSIS — R262 Difficulty in walking, not elsewhere classified: Secondary | ICD-10-CM | POA: Diagnosis not present

## 2014-12-27 DIAGNOSIS — K264 Chronic or unspecified duodenal ulcer with hemorrhage: Secondary | ICD-10-CM | POA: Diagnosis not present

## 2014-12-27 DIAGNOSIS — F329 Major depressive disorder, single episode, unspecified: Secondary | ICD-10-CM | POA: Diagnosis not present

## 2014-12-27 DIAGNOSIS — I10 Essential (primary) hypertension: Secondary | ICD-10-CM | POA: Diagnosis not present

## 2014-12-27 DIAGNOSIS — D464 Refractory anemia, unspecified: Secondary | ICD-10-CM | POA: Diagnosis not present

## 2014-12-27 DIAGNOSIS — R600 Localized edema: Secondary | ICD-10-CM | POA: Diagnosis not present

## 2014-12-27 DIAGNOSIS — R531 Weakness: Secondary | ICD-10-CM | POA: Diagnosis not present

## 2014-12-27 DIAGNOSIS — Z7982 Long term (current) use of aspirin: Secondary | ICD-10-CM | POA: Diagnosis not present

## 2014-12-27 DIAGNOSIS — R1312 Dysphagia, oropharyngeal phase: Secondary | ICD-10-CM | POA: Diagnosis not present

## 2014-12-27 DIAGNOSIS — F339 Major depressive disorder, recurrent, unspecified: Secondary | ICD-10-CM | POA: Diagnosis not present

## 2014-12-27 DIAGNOSIS — J189 Pneumonia, unspecified organism: Secondary | ICD-10-CM | POA: Diagnosis not present

## 2014-12-27 DIAGNOSIS — E1165 Type 2 diabetes mellitus with hyperglycemia: Secondary | ICD-10-CM | POA: Diagnosis not present

## 2014-12-27 DIAGNOSIS — L89152 Pressure ulcer of sacral region, stage 2: Secondary | ICD-10-CM | POA: Diagnosis not present

## 2014-12-27 DIAGNOSIS — I480 Paroxysmal atrial fibrillation: Secondary | ICD-10-CM | POA: Diagnosis not present

## 2014-12-27 DIAGNOSIS — R1319 Other dysphagia: Secondary | ICD-10-CM | POA: Diagnosis not present

## 2014-12-27 DIAGNOSIS — J9601 Acute respiratory failure with hypoxia: Secondary | ICD-10-CM | POA: Diagnosis not present

## 2014-12-27 DIAGNOSIS — R5381 Other malaise: Secondary | ICD-10-CM | POA: Diagnosis not present

## 2014-12-27 DIAGNOSIS — D649 Anemia, unspecified: Secondary | ICD-10-CM | POA: Diagnosis not present

## 2014-12-27 DIAGNOSIS — Z7951 Long term (current) use of inhaled steroids: Secondary | ICD-10-CM | POA: Diagnosis not present

## 2014-12-27 DIAGNOSIS — J849 Interstitial pulmonary disease, unspecified: Secondary | ICD-10-CM | POA: Diagnosis not present

## 2014-12-27 DIAGNOSIS — I5032 Chronic diastolic (congestive) heart failure: Secondary | ICD-10-CM | POA: Diagnosis not present

## 2014-12-27 DIAGNOSIS — J9611 Chronic respiratory failure with hypoxia: Secondary | ICD-10-CM | POA: Diagnosis not present

## 2014-12-27 DIAGNOSIS — I5031 Acute diastolic (congestive) heart failure: Secondary | ICD-10-CM | POA: Diagnosis not present

## 2014-12-27 DIAGNOSIS — E1129 Type 2 diabetes mellitus with other diabetic kidney complication: Secondary | ICD-10-CM | POA: Diagnosis not present

## 2014-12-27 DIAGNOSIS — I509 Heart failure, unspecified: Secondary | ICD-10-CM | POA: Diagnosis not present

## 2014-12-27 DIAGNOSIS — N186 End stage renal disease: Secondary | ICD-10-CM | POA: Diagnosis not present

## 2014-12-27 DIAGNOSIS — E11649 Type 2 diabetes mellitus with hypoglycemia without coma: Secondary | ICD-10-CM | POA: Diagnosis not present

## 2014-12-27 DIAGNOSIS — E785 Hyperlipidemia, unspecified: Secondary | ICD-10-CM | POA: Diagnosis not present

## 2014-12-27 DIAGNOSIS — K219 Gastro-esophageal reflux disease without esophagitis: Secondary | ICD-10-CM | POA: Diagnosis not present

## 2014-12-27 DIAGNOSIS — N390 Urinary tract infection, site not specified: Secondary | ICD-10-CM | POA: Diagnosis not present

## 2014-12-27 DIAGNOSIS — K922 Gastrointestinal hemorrhage, unspecified: Secondary | ICD-10-CM | POA: Diagnosis not present

## 2014-12-27 DIAGNOSIS — D631 Anemia in chronic kidney disease: Secondary | ICD-10-CM | POA: Diagnosis not present

## 2014-12-27 DIAGNOSIS — L89151 Pressure ulcer of sacral region, stage 1: Secondary | ICD-10-CM | POA: Diagnosis not present

## 2014-12-27 DIAGNOSIS — R609 Edema, unspecified: Secondary | ICD-10-CM | POA: Diagnosis not present

## 2014-12-27 DIAGNOSIS — E119 Type 2 diabetes mellitus without complications: Secondary | ICD-10-CM | POA: Diagnosis not present

## 2014-12-27 DIAGNOSIS — E1122 Type 2 diabetes mellitus with diabetic chronic kidney disease: Secondary | ICD-10-CM | POA: Diagnosis not present

## 2014-12-27 DIAGNOSIS — R790 Abnormal level of blood mineral: Secondary | ICD-10-CM | POA: Diagnosis not present

## 2014-12-27 DIAGNOSIS — N189 Chronic kidney disease, unspecified: Secondary | ICD-10-CM | POA: Diagnosis not present

## 2014-12-27 DIAGNOSIS — R52 Pain, unspecified: Secondary | ICD-10-CM | POA: Diagnosis not present

## 2014-12-27 DIAGNOSIS — Z7952 Long term (current) use of systemic steroids: Secondary | ICD-10-CM | POA: Diagnosis not present

## 2014-12-27 DIAGNOSIS — D696 Thrombocytopenia, unspecified: Secondary | ICD-10-CM | POA: Diagnosis not present

## 2014-12-27 DIAGNOSIS — I499 Cardiac arrhythmia, unspecified: Secondary | ICD-10-CM | POA: Diagnosis not present

## 2014-12-28 DIAGNOSIS — Z885 Allergy status to narcotic agent status: Secondary | ICD-10-CM | POA: Diagnosis not present

## 2014-12-28 DIAGNOSIS — I509 Heart failure, unspecified: Secondary | ICD-10-CM | POA: Diagnosis not present

## 2014-12-28 DIAGNOSIS — Z79899 Other long term (current) drug therapy: Secondary | ICD-10-CM | POA: Diagnosis not present

## 2014-12-28 DIAGNOSIS — N189 Chronic kidney disease, unspecified: Secondary | ICD-10-CM | POA: Diagnosis not present

## 2014-12-28 DIAGNOSIS — E785 Hyperlipidemia, unspecified: Secondary | ICD-10-CM | POA: Diagnosis not present

## 2014-12-28 DIAGNOSIS — D649 Anemia, unspecified: Secondary | ICD-10-CM | POA: Diagnosis not present

## 2014-12-28 DIAGNOSIS — Z8673 Personal history of transient ischemic attack (TIA), and cerebral infarction without residual deficits: Secondary | ICD-10-CM | POA: Diagnosis not present

## 2014-12-28 DIAGNOSIS — R9431 Abnormal electrocardiogram [ECG] [EKG]: Secondary | ICD-10-CM | POA: Diagnosis not present

## 2014-12-28 DIAGNOSIS — Z794 Long term (current) use of insulin: Secondary | ICD-10-CM | POA: Diagnosis not present

## 2014-12-28 DIAGNOSIS — E119 Type 2 diabetes mellitus without complications: Secondary | ICD-10-CM | POA: Diagnosis not present

## 2014-12-28 DIAGNOSIS — F339 Major depressive disorder, recurrent, unspecified: Secondary | ICD-10-CM | POA: Diagnosis not present

## 2014-12-28 DIAGNOSIS — I10 Essential (primary) hypertension: Secondary | ICD-10-CM | POA: Diagnosis not present

## 2014-12-29 DIAGNOSIS — N189 Chronic kidney disease, unspecified: Secondary | ICD-10-CM | POA: Diagnosis not present

## 2014-12-29 DIAGNOSIS — F339 Major depressive disorder, recurrent, unspecified: Secondary | ICD-10-CM | POA: Diagnosis not present

## 2014-12-29 DIAGNOSIS — I509 Heart failure, unspecified: Secondary | ICD-10-CM | POA: Diagnosis not present

## 2014-12-29 DIAGNOSIS — I10 Essential (primary) hypertension: Secondary | ICD-10-CM | POA: Diagnosis not present

## 2014-12-29 DIAGNOSIS — E785 Hyperlipidemia, unspecified: Secondary | ICD-10-CM | POA: Diagnosis not present

## 2014-12-30 DIAGNOSIS — K219 Gastro-esophageal reflux disease without esophagitis: Secondary | ICD-10-CM | POA: Diagnosis not present

## 2015-01-02 DIAGNOSIS — M6281 Muscle weakness (generalized): Secondary | ICD-10-CM | POA: Diagnosis not present

## 2015-01-03 DIAGNOSIS — R600 Localized edema: Secondary | ICD-10-CM | POA: Diagnosis not present

## 2015-01-05 DIAGNOSIS — N189 Chronic kidney disease, unspecified: Secondary | ICD-10-CM | POA: Diagnosis not present

## 2015-01-08 DIAGNOSIS — E1129 Type 2 diabetes mellitus with other diabetic kidney complication: Secondary | ICD-10-CM | POA: Diagnosis not present

## 2015-01-08 DIAGNOSIS — R52 Pain, unspecified: Secondary | ICD-10-CM | POA: Diagnosis not present

## 2015-01-09 DIAGNOSIS — E119 Type 2 diabetes mellitus without complications: Secondary | ICD-10-CM | POA: Diagnosis not present

## 2015-01-09 DIAGNOSIS — R040 Epistaxis: Secondary | ICD-10-CM | POA: Diagnosis not present

## 2015-01-09 DIAGNOSIS — Z515 Encounter for palliative care: Secondary | ICD-10-CM | POA: Diagnosis not present

## 2015-01-09 DIAGNOSIS — E1121 Type 2 diabetes mellitus with diabetic nephropathy: Secondary | ICD-10-CM | POA: Diagnosis not present

## 2015-01-09 DIAGNOSIS — Z7952 Long term (current) use of systemic steroids: Secondary | ICD-10-CM | POA: Diagnosis not present

## 2015-01-09 DIAGNOSIS — I493 Ventricular premature depolarization: Secondary | ICD-10-CM | POA: Diagnosis not present

## 2015-01-09 DIAGNOSIS — I5040 Unspecified combined systolic (congestive) and diastolic (congestive) heart failure: Secondary | ICD-10-CM | POA: Diagnosis not present

## 2015-01-09 DIAGNOSIS — E1122 Type 2 diabetes mellitus with diabetic chronic kidney disease: Secondary | ICD-10-CM | POA: Diagnosis not present

## 2015-01-09 DIAGNOSIS — L89151 Pressure ulcer of sacral region, stage 1: Secondary | ICD-10-CM | POA: Diagnosis not present

## 2015-01-09 DIAGNOSIS — R140 Abdominal distension (gaseous): Secondary | ICD-10-CM | POA: Diagnosis not present

## 2015-01-09 DIAGNOSIS — R609 Edema, unspecified: Secondary | ICD-10-CM | POA: Diagnosis not present

## 2015-01-09 DIAGNOSIS — Z9889 Other specified postprocedural states: Secondary | ICD-10-CM | POA: Diagnosis not present

## 2015-01-09 DIAGNOSIS — I10 Essential (primary) hypertension: Secondary | ICD-10-CM | POA: Diagnosis not present

## 2015-01-09 DIAGNOSIS — R0600 Dyspnea, unspecified: Secondary | ICD-10-CM | POA: Diagnosis not present

## 2015-01-09 DIAGNOSIS — E1165 Type 2 diabetes mellitus with hyperglycemia: Secondary | ICD-10-CM | POA: Diagnosis not present

## 2015-01-09 DIAGNOSIS — K269 Duodenal ulcer, unspecified as acute or chronic, without hemorrhage or perforation: Secondary | ICD-10-CM | POA: Diagnosis not present

## 2015-01-09 DIAGNOSIS — D500 Iron deficiency anemia secondary to blood loss (chronic): Secondary | ICD-10-CM | POA: Diagnosis not present

## 2015-01-09 DIAGNOSIS — E11649 Type 2 diabetes mellitus with hypoglycemia without coma: Secondary | ICD-10-CM | POA: Diagnosis not present

## 2015-01-09 DIAGNOSIS — N184 Chronic kidney disease, stage 4 (severe): Secondary | ICD-10-CM | POA: Diagnosis not present

## 2015-01-09 DIAGNOSIS — Z452 Encounter for adjustment and management of vascular access device: Secondary | ICD-10-CM | POA: Diagnosis not present

## 2015-01-09 DIAGNOSIS — I469 Cardiac arrest, cause unspecified: Secondary | ICD-10-CM | POA: Diagnosis not present

## 2015-01-09 DIAGNOSIS — I272 Other secondary pulmonary hypertension: Secondary | ICD-10-CM | POA: Diagnosis not present

## 2015-01-09 DIAGNOSIS — I4891 Unspecified atrial fibrillation: Secondary | ICD-10-CM | POA: Diagnosis not present

## 2015-01-09 DIAGNOSIS — R509 Fever, unspecified: Secondary | ICD-10-CM | POA: Diagnosis not present

## 2015-01-09 DIAGNOSIS — I498 Other specified cardiac arrhythmias: Secondary | ICD-10-CM | POA: Diagnosis not present

## 2015-01-09 DIAGNOSIS — J84113 Idiopathic non-specific interstitial pneumonitis: Secondary | ICD-10-CM | POA: Diagnosis not present

## 2015-01-09 DIAGNOSIS — R5381 Other malaise: Secondary | ICD-10-CM | POA: Diagnosis not present

## 2015-01-09 DIAGNOSIS — R05 Cough: Secondary | ICD-10-CM | POA: Diagnosis not present

## 2015-01-09 DIAGNOSIS — I214 Non-ST elevation (NSTEMI) myocardial infarction: Secondary | ICD-10-CM | POA: Diagnosis not present

## 2015-01-09 DIAGNOSIS — D62 Acute posthemorrhagic anemia: Secondary | ICD-10-CM | POA: Diagnosis not present

## 2015-01-09 DIAGNOSIS — F329 Major depressive disorder, single episode, unspecified: Secondary | ICD-10-CM | POA: Diagnosis not present

## 2015-01-09 DIAGNOSIS — N189 Chronic kidney disease, unspecified: Secondary | ICD-10-CM | POA: Diagnosis not present

## 2015-01-09 DIAGNOSIS — R790 Abnormal level of blood mineral: Secondary | ICD-10-CM | POA: Diagnosis not present

## 2015-01-09 DIAGNOSIS — L89152 Pressure ulcer of sacral region, stage 2: Secondary | ICD-10-CM | POA: Diagnosis not present

## 2015-01-09 DIAGNOSIS — E1140 Type 2 diabetes mellitus with diabetic neuropathy, unspecified: Secondary | ICD-10-CM | POA: Diagnosis not present

## 2015-01-09 DIAGNOSIS — I517 Cardiomegaly: Secondary | ICD-10-CM | POA: Diagnosis not present

## 2015-01-09 DIAGNOSIS — J90 Pleural effusion, not elsewhere classified: Secondary | ICD-10-CM | POA: Diagnosis not present

## 2015-01-09 DIAGNOSIS — I480 Paroxysmal atrial fibrillation: Secondary | ICD-10-CM | POA: Diagnosis not present

## 2015-01-09 DIAGNOSIS — Z7982 Long term (current) use of aspirin: Secondary | ICD-10-CM | POA: Diagnosis not present

## 2015-01-09 DIAGNOSIS — K921 Melena: Secondary | ICD-10-CM | POA: Diagnosis not present

## 2015-01-09 DIAGNOSIS — J15212 Pneumonia due to Methicillin resistant Staphylococcus aureus: Secondary | ICD-10-CM | POA: Diagnosis not present

## 2015-01-09 DIAGNOSIS — I5030 Unspecified diastolic (congestive) heart failure: Secondary | ICD-10-CM | POA: Diagnosis not present

## 2015-01-09 DIAGNOSIS — J984 Other disorders of lung: Secondary | ICD-10-CM | POA: Diagnosis not present

## 2015-01-09 DIAGNOSIS — D649 Anemia, unspecified: Secondary | ICD-10-CM | POA: Diagnosis not present

## 2015-01-09 DIAGNOSIS — Z885 Allergy status to narcotic agent status: Secondary | ICD-10-CM | POA: Diagnosis not present

## 2015-01-09 DIAGNOSIS — J849 Interstitial pulmonary disease, unspecified: Secondary | ICD-10-CM | POA: Diagnosis not present

## 2015-01-09 DIAGNOSIS — K922 Gastrointestinal hemorrhage, unspecified: Secondary | ICD-10-CM | POA: Diagnosis not present

## 2015-01-09 DIAGNOSIS — J9601 Acute respiratory failure with hypoxia: Secondary | ICD-10-CM | POA: Diagnosis not present

## 2015-01-09 DIAGNOSIS — J811 Chronic pulmonary edema: Secondary | ICD-10-CM | POA: Diagnosis not present

## 2015-01-09 DIAGNOSIS — I132 Hypertensive heart and chronic kidney disease with heart failure and with stage 5 chronic kidney disease, or end stage renal disease: Secondary | ICD-10-CM | POA: Diagnosis not present

## 2015-01-09 DIAGNOSIS — K264 Chronic or unspecified duodenal ulcer with hemorrhage: Secondary | ICD-10-CM | POA: Diagnosis not present

## 2015-01-09 DIAGNOSIS — I081 Rheumatic disorders of both mitral and tricuspid valves: Secondary | ICD-10-CM | POA: Diagnosis not present

## 2015-01-09 DIAGNOSIS — R0902 Hypoxemia: Secondary | ICD-10-CM | POA: Diagnosis not present

## 2015-01-09 DIAGNOSIS — D464 Refractory anemia, unspecified: Secondary | ICD-10-CM | POA: Diagnosis not present

## 2015-01-09 DIAGNOSIS — J80 Acute respiratory distress syndrome: Secondary | ICD-10-CM | POA: Diagnosis not present

## 2015-01-09 DIAGNOSIS — Z9911 Dependence on respirator [ventilator] status: Secondary | ICD-10-CM | POA: Diagnosis not present

## 2015-01-09 DIAGNOSIS — I129 Hypertensive chronic kidney disease with stage 1 through stage 4 chronic kidney disease, or unspecified chronic kidney disease: Secondary | ICD-10-CM | POA: Diagnosis not present

## 2015-01-09 DIAGNOSIS — E8770 Fluid overload, unspecified: Secondary | ICD-10-CM | POA: Diagnosis not present

## 2015-01-09 DIAGNOSIS — R9431 Abnormal electrocardiogram [ECG] [EKG]: Secondary | ICD-10-CM | POA: Diagnosis not present

## 2015-01-09 DIAGNOSIS — N179 Acute kidney failure, unspecified: Secondary | ICD-10-CM | POA: Diagnosis not present

## 2015-01-09 DIAGNOSIS — I5032 Chronic diastolic (congestive) heart failure: Secondary | ICD-10-CM | POA: Diagnosis not present

## 2015-01-09 DIAGNOSIS — I499 Cardiac arrhythmia, unspecified: Secondary | ICD-10-CM | POA: Diagnosis not present

## 2015-01-09 DIAGNOSIS — R918 Other nonspecific abnormal finding of lung field: Secondary | ICD-10-CM | POA: Diagnosis not present

## 2015-01-09 DIAGNOSIS — R0602 Shortness of breath: Secondary | ICD-10-CM | POA: Diagnosis not present

## 2015-01-09 DIAGNOSIS — Z992 Dependence on renal dialysis: Secondary | ICD-10-CM | POA: Diagnosis not present

## 2015-01-09 DIAGNOSIS — I472 Ventricular tachycardia: Secondary | ICD-10-CM | POA: Diagnosis not present

## 2015-01-09 DIAGNOSIS — N186 End stage renal disease: Secondary | ICD-10-CM | POA: Diagnosis not present

## 2015-01-09 DIAGNOSIS — Z7951 Long term (current) use of inhaled steroids: Secondary | ICD-10-CM | POA: Diagnosis not present

## 2015-01-09 DIAGNOSIS — D696 Thrombocytopenia, unspecified: Secondary | ICD-10-CM | POA: Diagnosis not present

## 2015-01-09 DIAGNOSIS — J8489 Other specified interstitial pulmonary diseases: Secondary | ICD-10-CM | POA: Diagnosis not present

## 2015-01-09 DIAGNOSIS — I959 Hypotension, unspecified: Secondary | ICD-10-CM | POA: Diagnosis not present

## 2015-01-09 DIAGNOSIS — Z4682 Encounter for fitting and adjustment of non-vascular catheter: Secondary | ICD-10-CM | POA: Diagnosis not present

## 2015-01-09 DIAGNOSIS — I509 Heart failure, unspecified: Secondary | ICD-10-CM | POA: Diagnosis not present

## 2015-01-11 ENCOUNTER — Other Ambulatory Visit: Payer: Self-pay | Admitting: Pharmacist

## 2015-01-11 NOTE — Patient Outreach (Signed)
[facility]) Care Management  [id number]  Nadalee Haydel Karg [id number]   Raechal Mcevers is a [id number] who was referred to [place].  Chart reviewed to determine disposition of patient.  Per care everywhere, noted patient is currently hospitalized at [facility].  Due to patient being hospitalized at [facility] and not able to assess current pharmacy needs, will close out current pharmacy program.  If pharmacy needs are found after discharge, please feel free to consult.     Elisabeth Most, Pharm.D. Pharmacy Resident [place] [phone]

## 2015-01-25 ENCOUNTER — Other Ambulatory Visit: Payer: Self-pay | Admitting: *Deleted

## 2015-01-25 DIAGNOSIS — I5031 Acute diastolic (congestive) heart failure: Secondary | ICD-10-CM

## 2015-02-01 ENCOUNTER — Other Ambulatory Visit: Payer: Self-pay | Admitting: *Deleted

## 2015-02-01 NOTE — Patient Outreach (Signed)
[place] [facility]) Care Management  [id number]  Charnese Offen [place] [id number]   Phone call to the [facility] [phone] to follow up on patient and to discuss discharge plan.  Voicemail message left for the discharge planner requesting a return call.    Sheralyn Boatman [facility] Care Management [phone]

## 2015-02-05 ENCOUNTER — Encounter: Payer: Self-pay | Admitting: *Deleted

## 2015-02-05 ENCOUNTER — Other Ambulatory Visit: Payer: Self-pay | Admitting: *Deleted

## 2015-02-05 NOTE — Patient Outreach (Signed)
View in [employer] [place] section today- shows pt expired 12/7 (included pt's address, date of birth).   In basket in Epic sent to [place] pt expired as she was attempting to contact pt while at [facility].   Plan to inform Lattie Haw ([place] management assistant) to close case.   Plan to send Dr. Humphrey Rolls case closure letter.    Zara Chess.   [place] Care Management  [phone]

## 2015-02-06 ENCOUNTER — Other Ambulatory Visit: Payer: Self-pay | Admitting: *Deleted

## 2015-02-06 NOTE — Patient Outreach (Signed)
[facility]) Care Management  [id number]  Pur[place] Barca Karpel [id number]   Received message from [facility]  that patient passed away on 14-Feb-2015. Case closed, per Epic notes RNCM has sent a notification letter to her primary care doctor.    Sheralyn Boatman [facility] Care Management [phone]

## 2015-02-25 DEATH — deceased

## 2016-02-08 ENCOUNTER — Other Ambulatory Visit: Payer: Self-pay | Admitting: Nurse Practitioner

## 2017-08-24 IMAGING — CR DG CHEST 2V
1 series · 2 of 2 positions shown · non-contrast
Comparison: 05/23/2013

CLINICAL DATA: Cough and shortness of breath for 4 days.

EXAM:
CHEST  2 VIEW

[Series 1: w chest pa · 0.14mm/px · 2 of 2 slices shown]
[im 1/2]
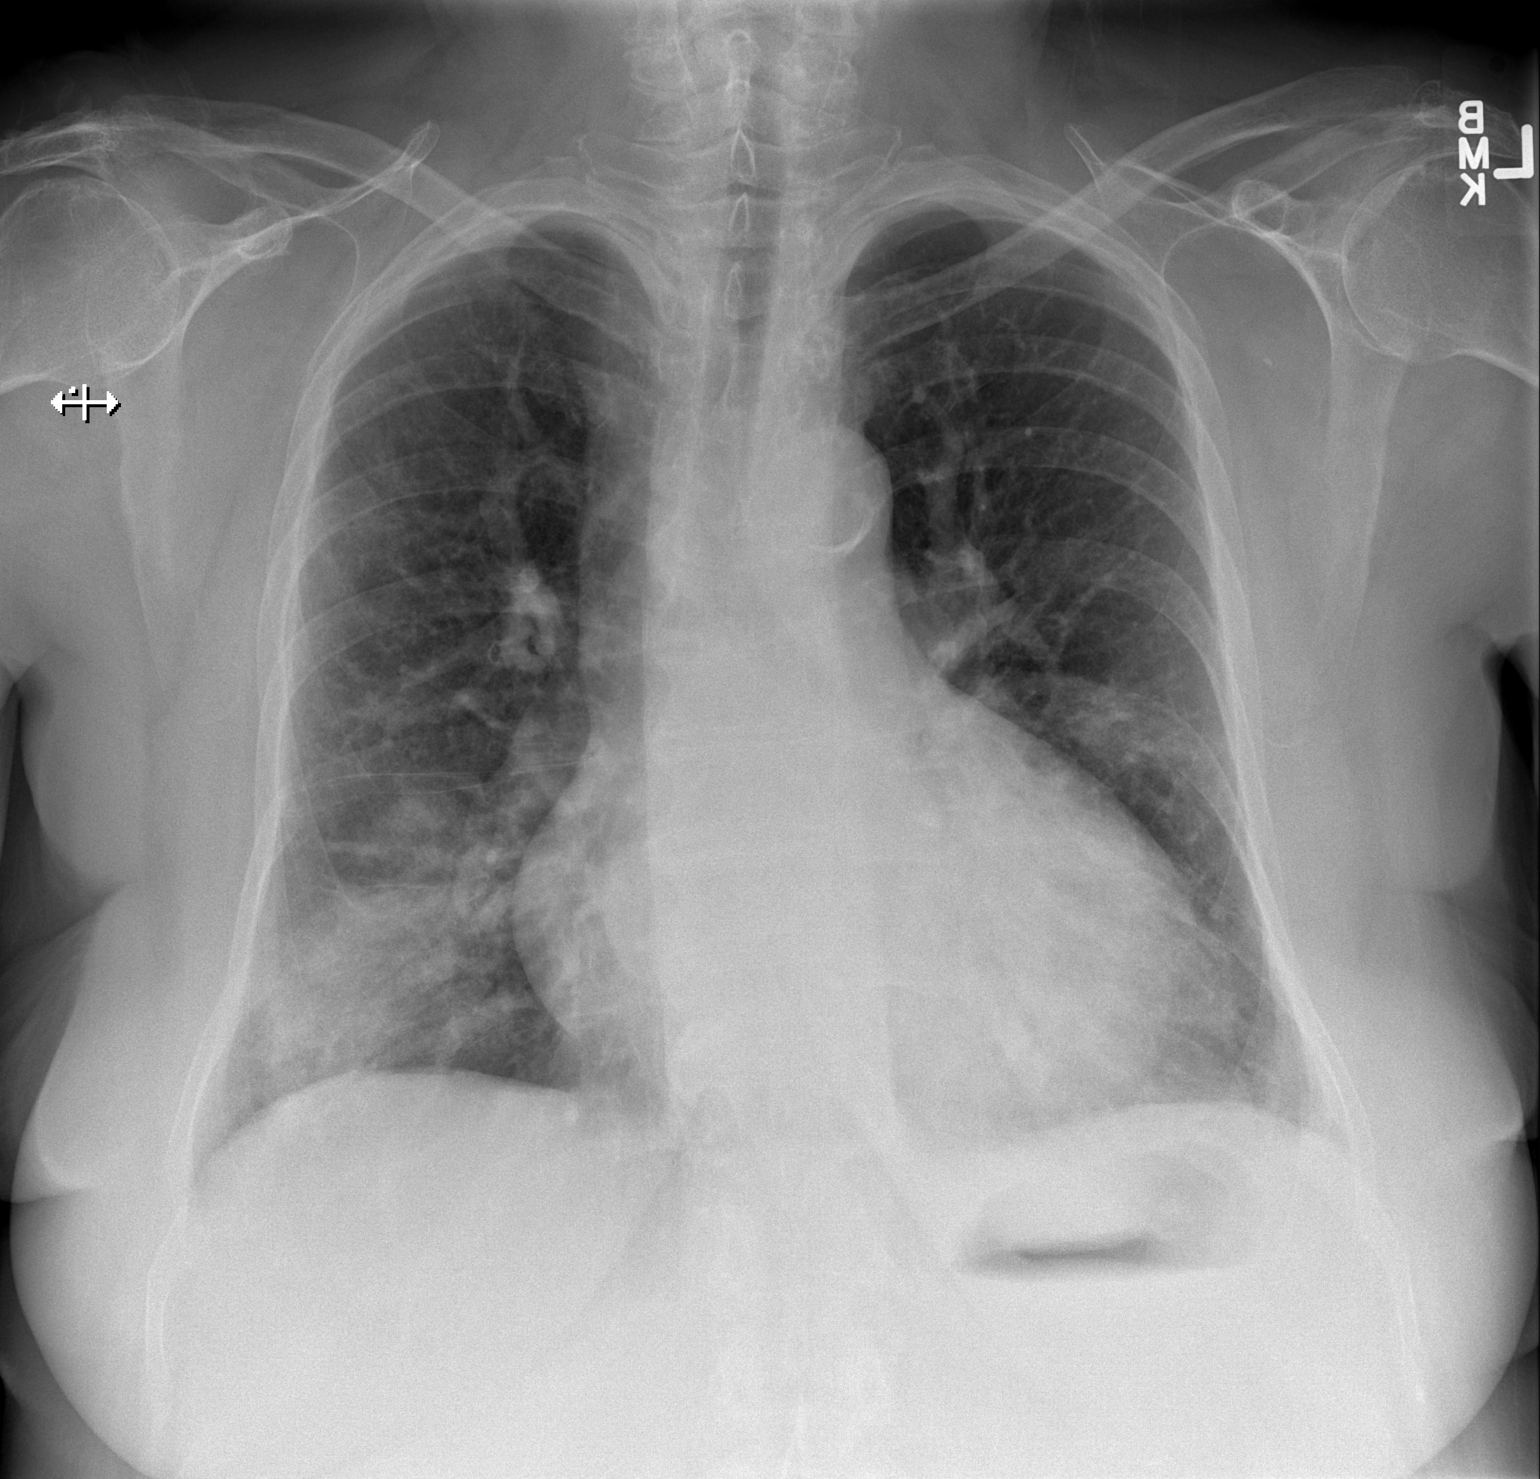
[im 2/2]
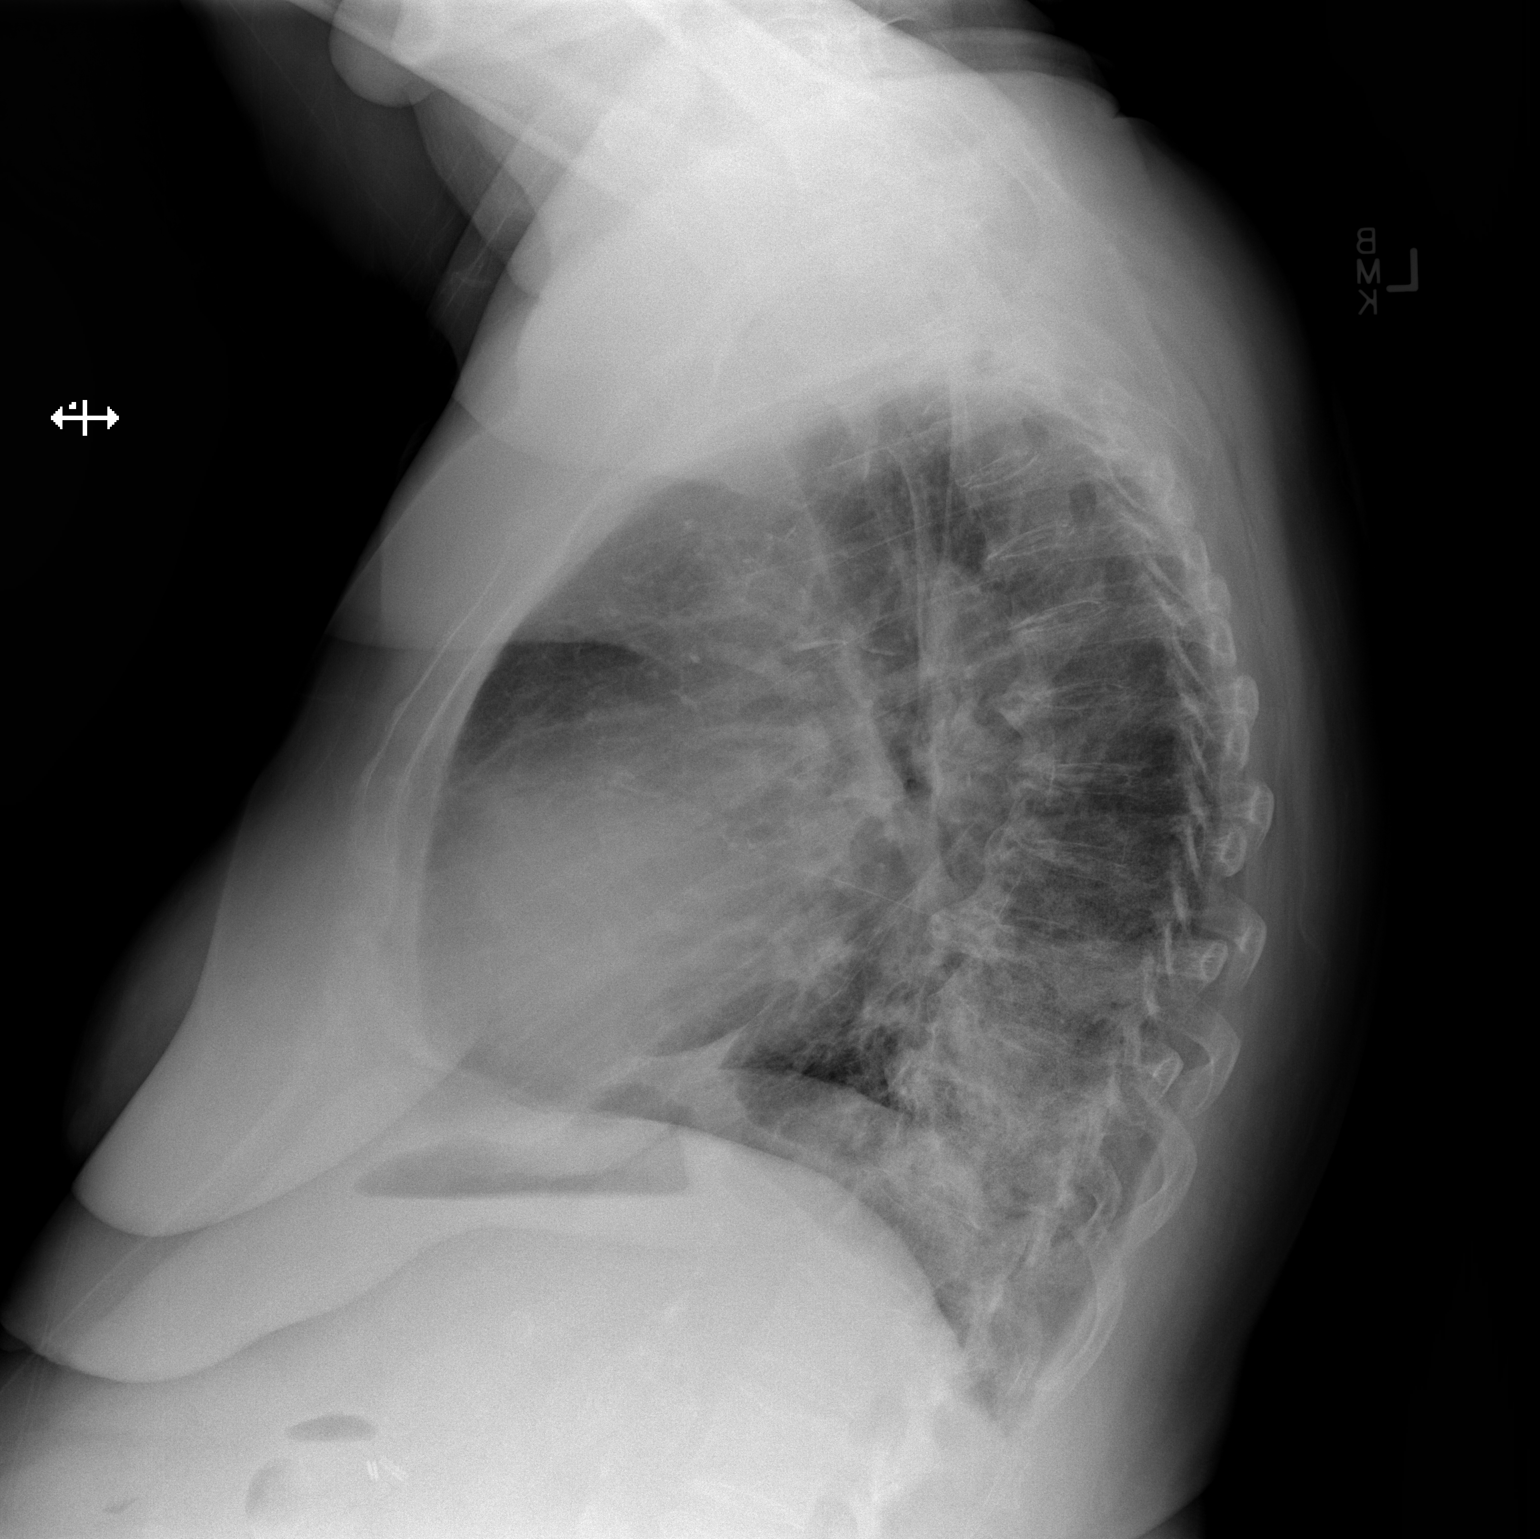

[2 of 2 positions shown; findings below may reference images not displayed]

FINDINGS: Mild cardiac enlargement. No pulmonary vascular congestion.
Mediastinal contours appear intact. Calcification of the aorta.
Focal airspace infiltration in the right lower lung likely
represents pneumonia. Followup PA and lateral chest X-ray is
recommended in 3-4 weeks following trial of antibiotic therapy to
ensure resolution and exclude underlying malignancy. Left lung is
clear. No blunting of costophrenic angles. No pneumothorax.
Degenerative changes in the spine.
IMPRESSION: Airspace infiltration in the right lower lung likely representing
pneumonia.

## 2017-09-04 IMAGING — CR DG CHEST 1V PORT
1 series · 1 of 1 positions shown · non-contrast
Comparison: 11/10/2014

CLINICAL DATA: Respiratory failure, ventilatory support

EXAM:
PORTABLE CHEST - 1 VIEW

[portable]
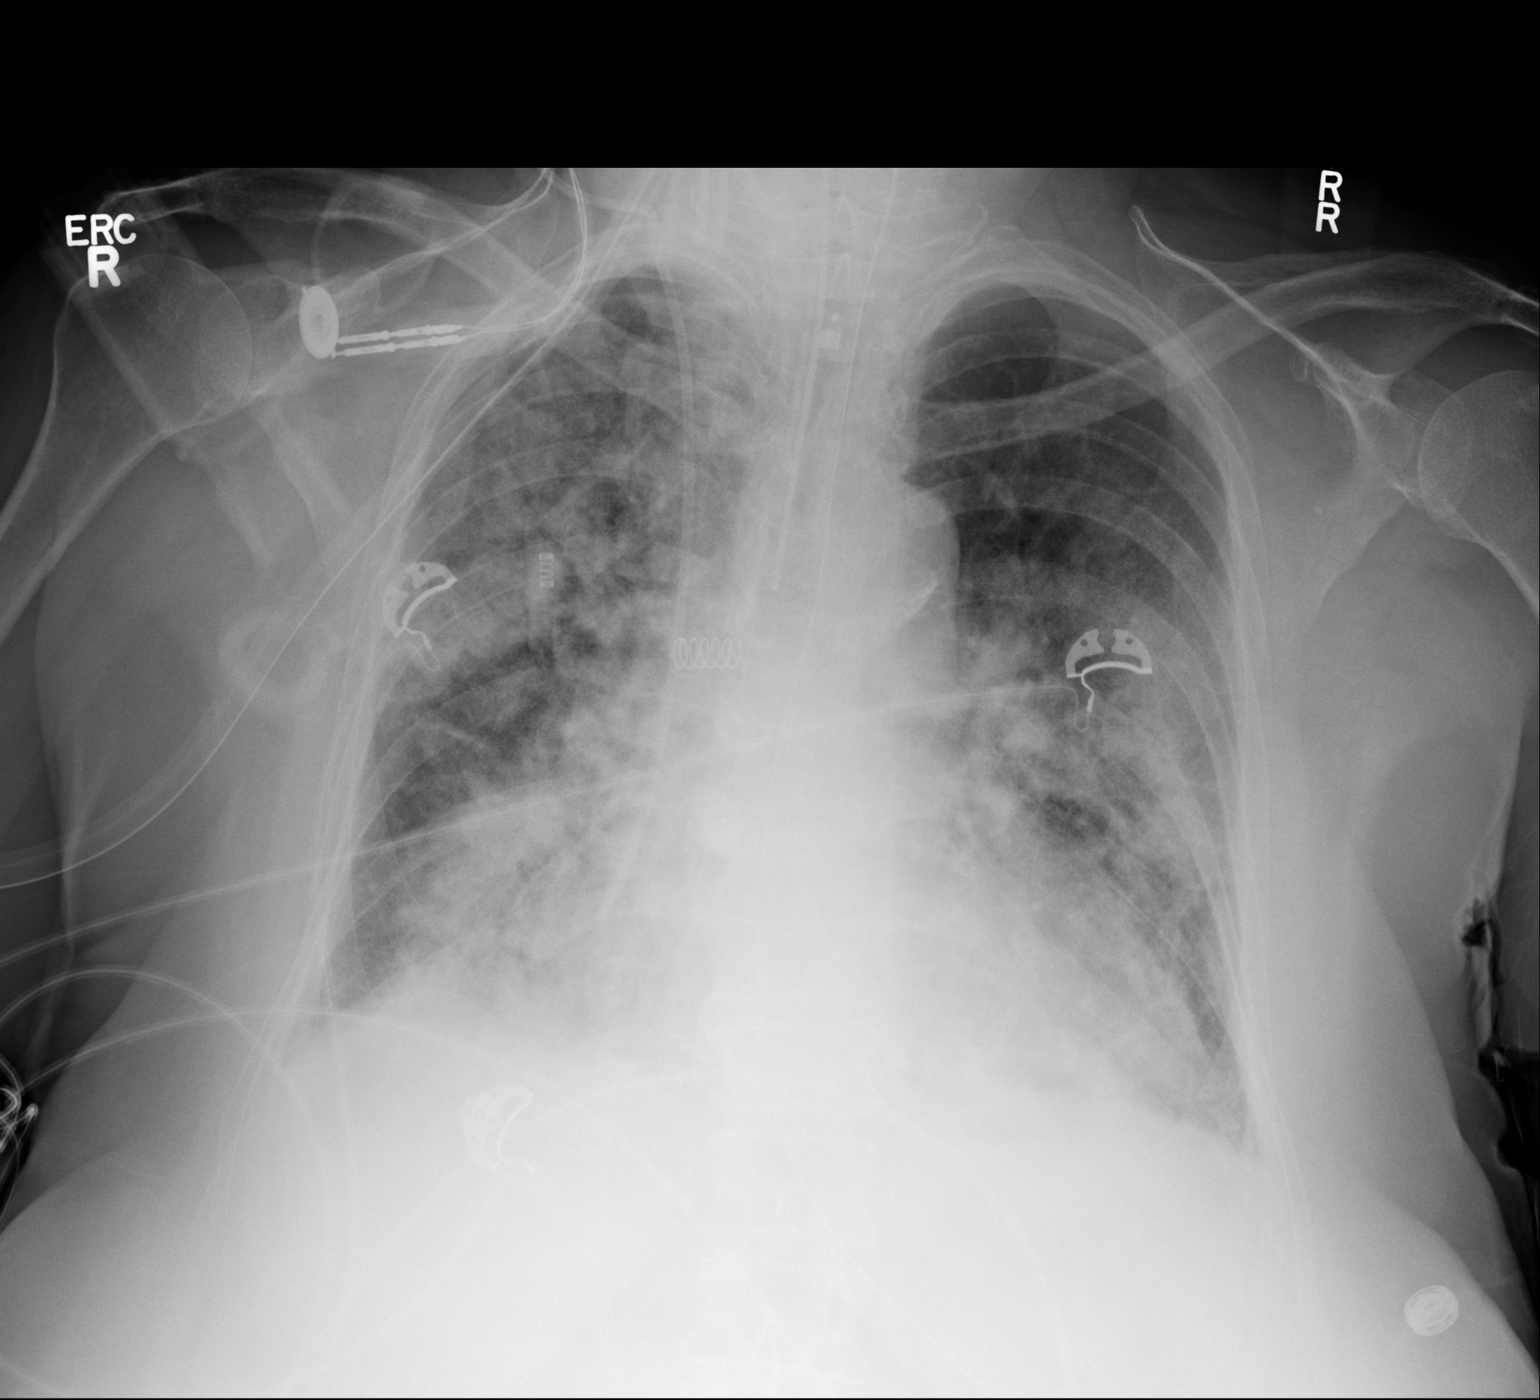

[1 of 1 positions shown; findings below may reference images not displayed]

FINDINGS: Endotracheal tube 1.6 cm above the carina. NG tube extends into the
stomach with the tip not visualized. Right IJ central line tip
proximal right atrium as before. Severe patchy bilateral airspace
process throughout both lungs with some sparing of the left upper
lobe compatible with pneumonia versus alveolar edema or hemorrhage.
No enlarging effusion or pneumothorax. No significant interval
change.
IMPRESSION: Stable support apparatus

Persistent asymmetric bilateral airspace disease compatible with
pneumonia. Little interval change.

## 2017-09-07 IMAGING — CR DG CHEST 1V
1 series · 1 of 1 positions shown · non-contrast
Comparison: 11/13/2014 and 11/10/2014

CLINICAL DATA: Dyspnea.

EXAM:
CHEST  1 VIEW

[portable]
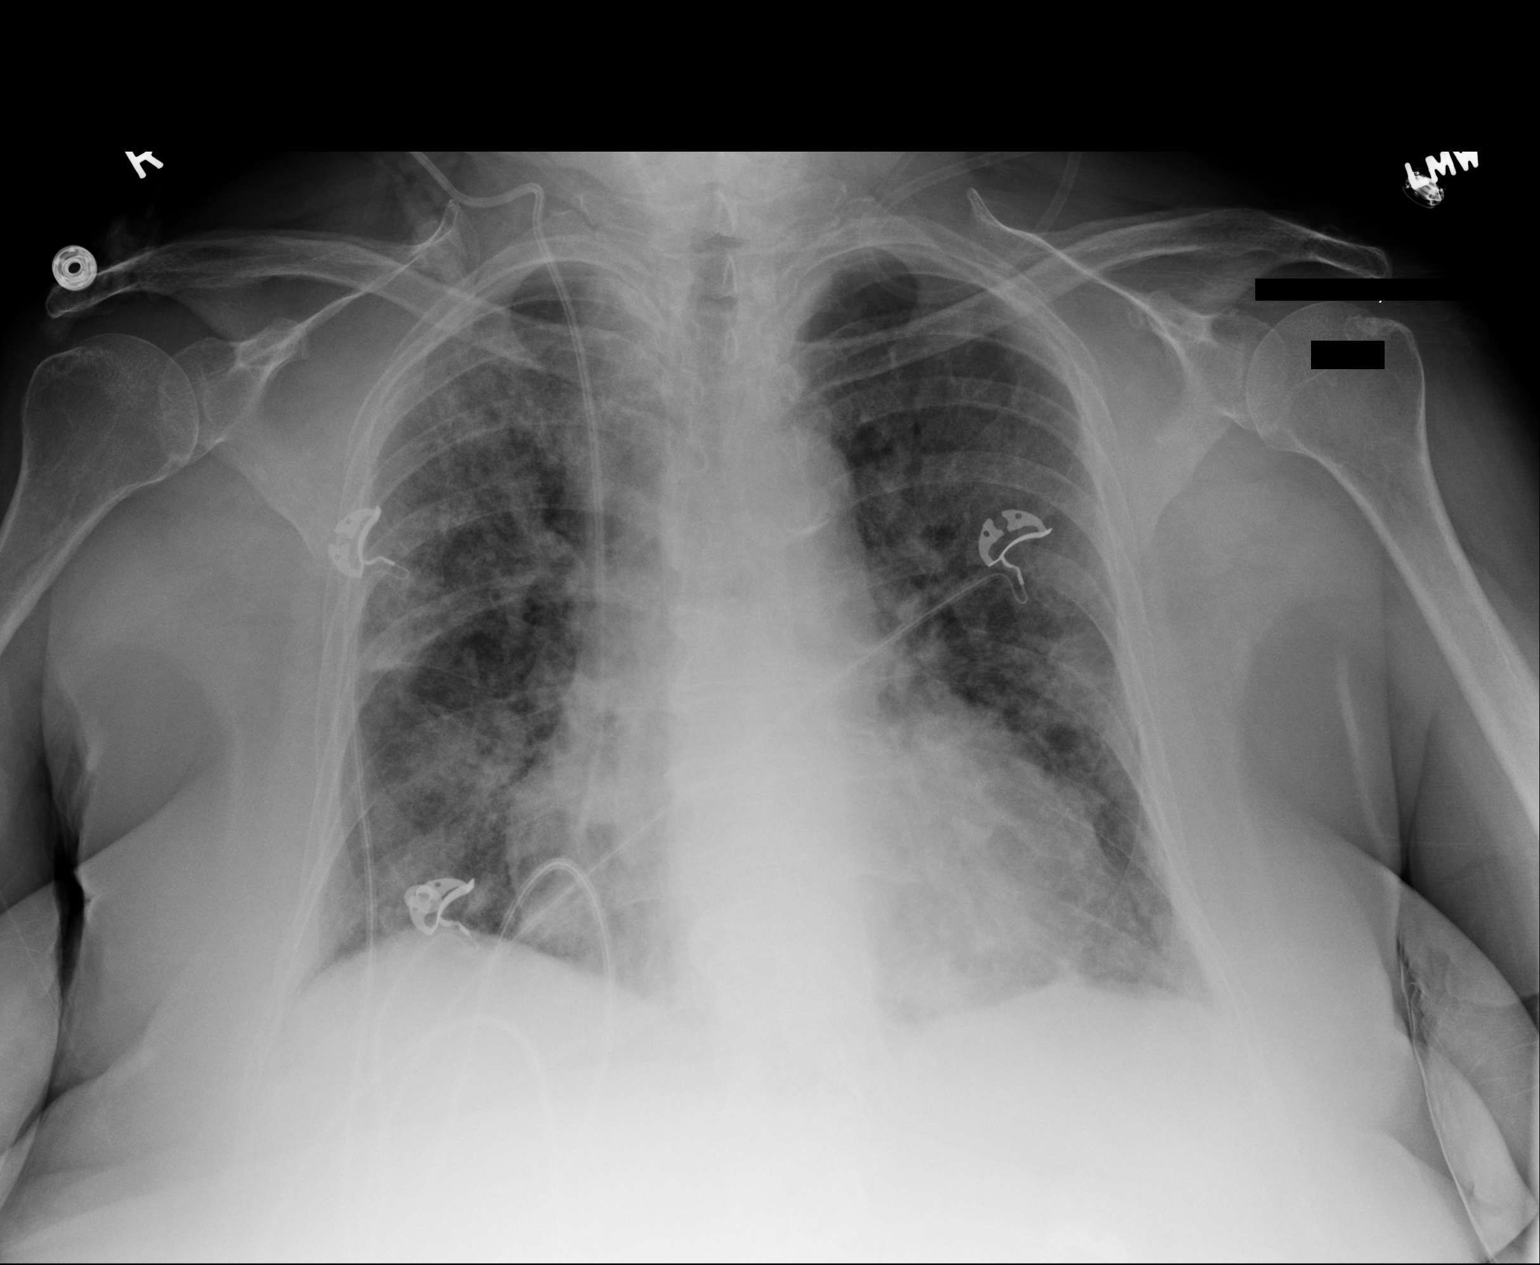

[1 of 1 positions shown; findings below may reference images not displayed]

FINDINGS: Right IJ central venous catheter with tip just below the cavoatrial
junction without significant change. Interval removal of nasogastric
tube and endotracheal tube.

Lungs are adequately inflated demonstrate persistent bilateral
patchy airspace opacification which may be slightly worse over the
right upper lobe compared to 11/13/2014, although greatly improved
compared to 11/10/2014. No evidence of effusion. Mild stable
cardiomegaly. Remainder of the exam is unchanged.
IMPRESSION: Patchy bilateral airspace process with marked improvement compared
to 11/10/2014.

Stable cardiomegaly.

Right IJ central venous catheter with tip just below the cavoatrial
junction.

## 2017-09-23 IMAGING — CR DG CHEST 2V
2 series · 2 of 2 positions shown · non-contrast
Comparison: 11/16/2014

CLINICAL DATA: Short of breath

EXAM:
CHEST  2 VIEW

[chest lat]
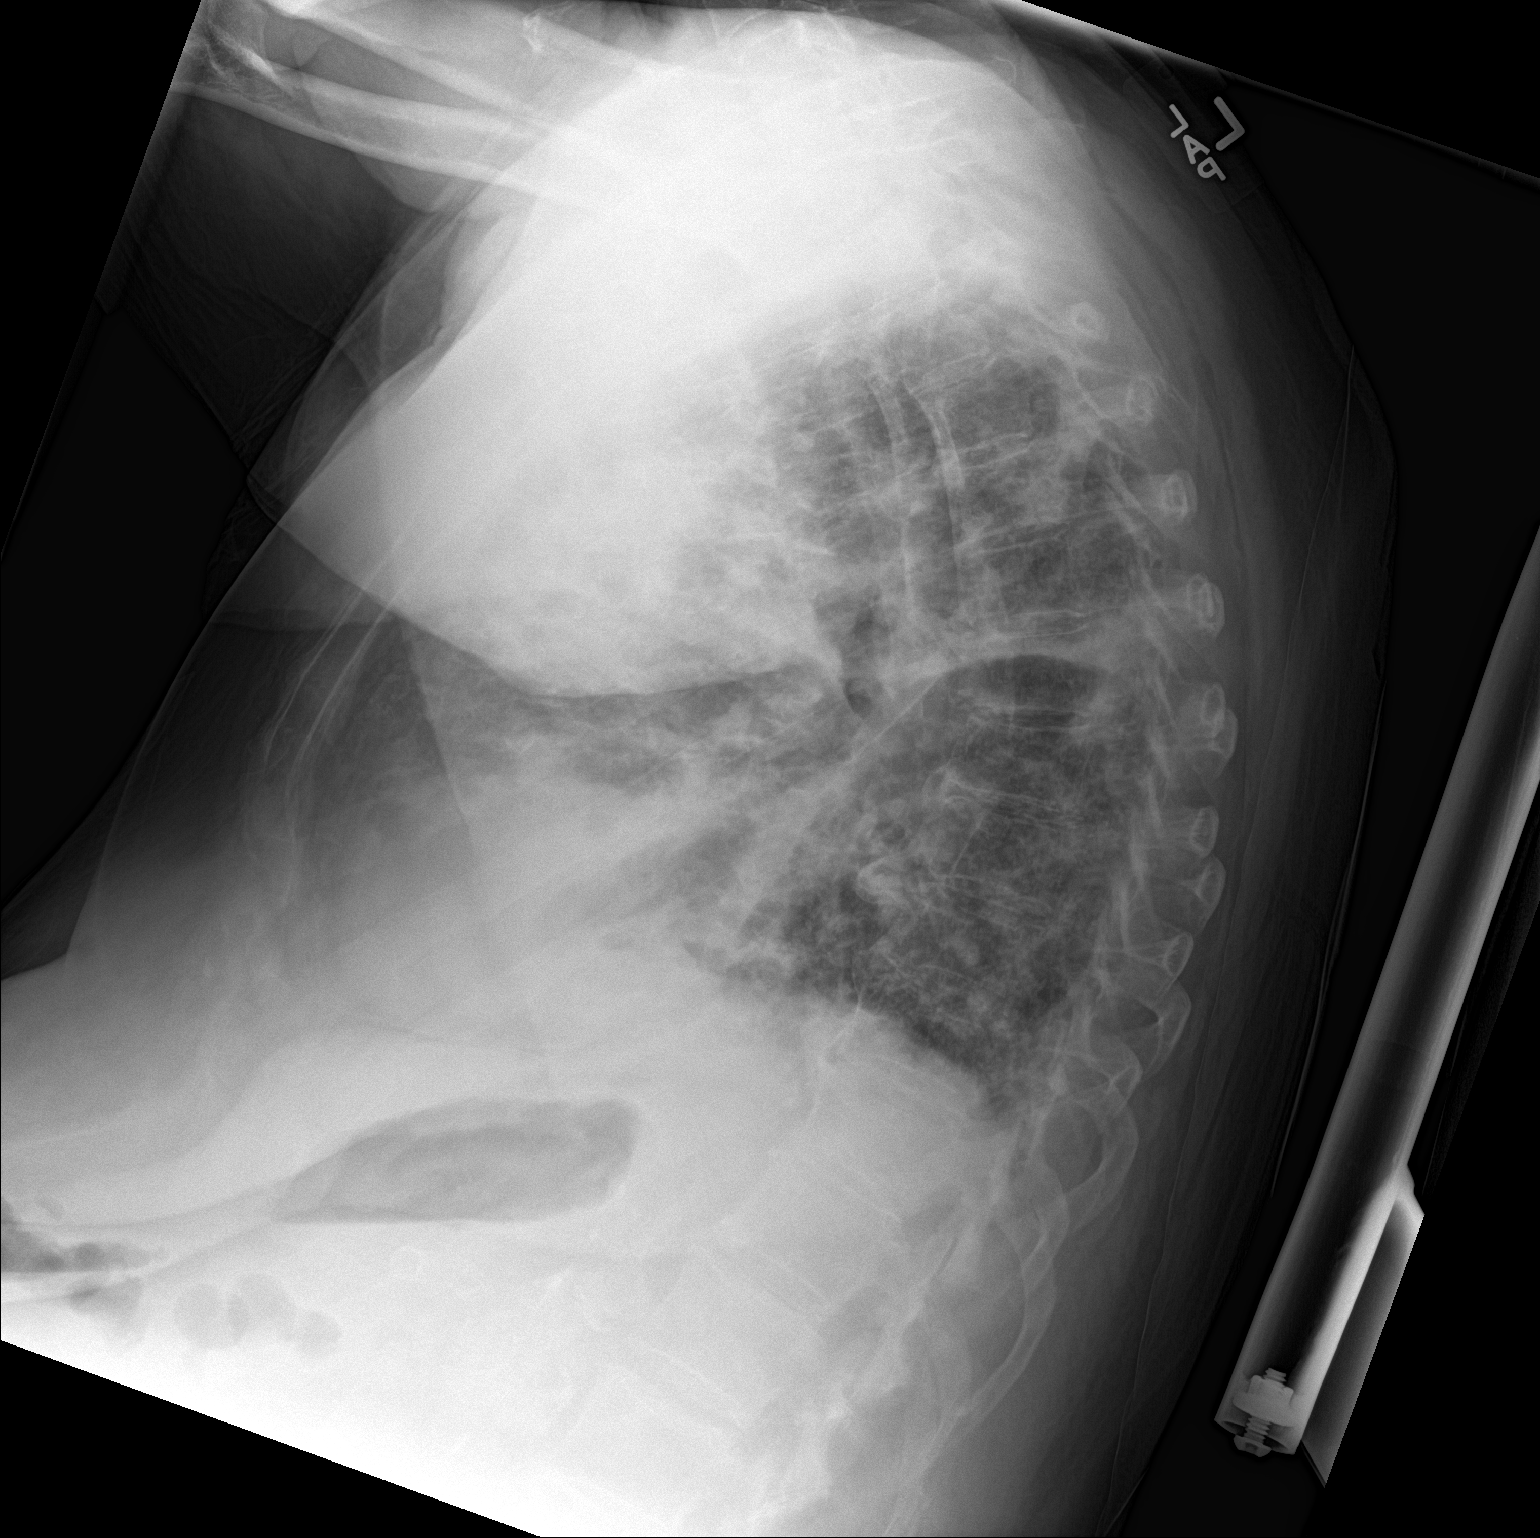

[chest ap]
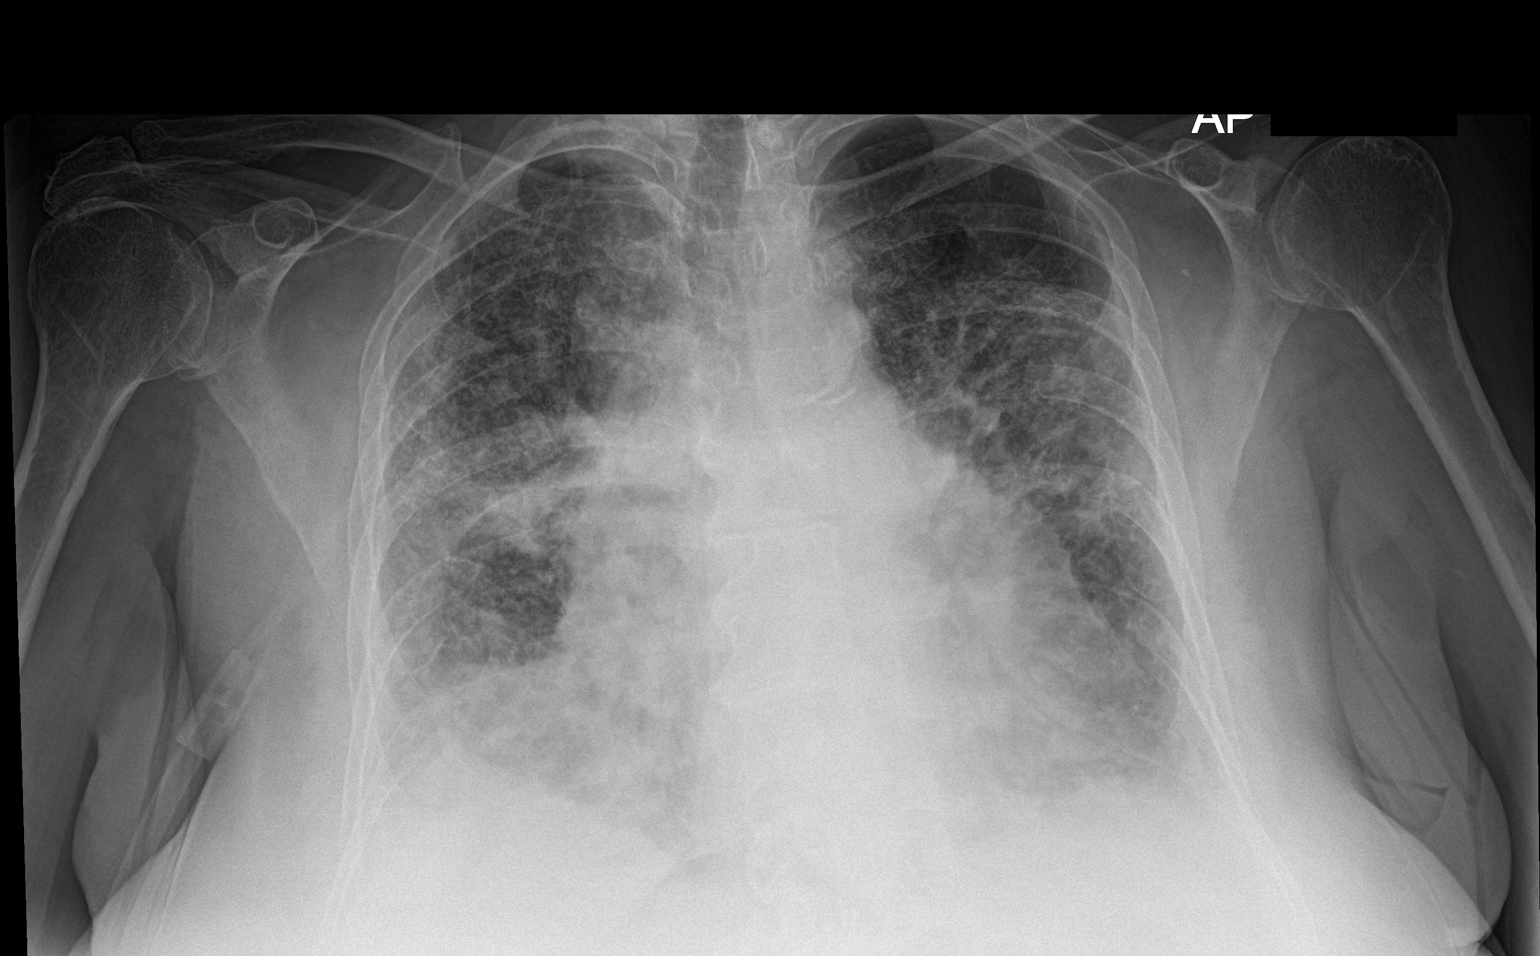

[2 of 2 positions shown; findings below may reference images not displayed]

FINDINGS: Lungs are under aerated. Heterogeneous opacities throughout both
lungs are worrisome for patchy bilateral extensive airspace disease.
Heart remains moderately enlarged. No pneumothorax.
IMPRESSION: Extensive bilateral airspace disease.
